# Patient Record
Sex: Female | Born: 1985 | Race: Black or African American | Hispanic: No | Marital: Single | Smoking: Current every day smoker
Health system: Southern US, Community
[De-identification: ages and names within clinical notes are randomized; demographics above are authoritative.]

## PROBLEM LIST (undated history)

## (undated) ENCOUNTER — Emergency Department (HOSPITAL_COMMUNITY): Admission: EM | Payer: Medicaid Other | Source: Home / Self Care

## (undated) DIAGNOSIS — N871 Moderate cervical dysplasia: Secondary | ICD-10-CM

## (undated) DIAGNOSIS — A630 Anogenital (venereal) warts: Secondary | ICD-10-CM

## (undated) DIAGNOSIS — IMO0002 Reserved for concepts with insufficient information to code with codable children: Secondary | ICD-10-CM

## (undated) DIAGNOSIS — R87619 Unspecified abnormal cytological findings in specimens from cervix uteri: Secondary | ICD-10-CM

## (undated) DIAGNOSIS — N870 Mild cervical dysplasia: Secondary | ICD-10-CM

## (undated) HISTORY — PX: COLPOSCOPY: SHX161

---
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
## 2003-01-11 ENCOUNTER — Emergency Department (HOSPITAL_COMMUNITY): Admission: EM | Admit: 2003-01-11 | Discharge: 2003-01-11 | Payer: Self-pay | Admitting: Emergency Medicine

## 2003-04-02 ENCOUNTER — Ambulatory Visit (HOSPITAL_COMMUNITY): Admission: RE | Admit: 2003-04-02 | Discharge: 2003-04-02 | Payer: Self-pay | Admitting: *Deleted

## 2003-04-20 ENCOUNTER — Inpatient Hospital Stay (HOSPITAL_COMMUNITY): Admission: AD | Admit: 2003-04-20 | Discharge: 2003-04-20 | Payer: Self-pay | Admitting: Obstetrics & Gynecology

## 2003-04-25 ENCOUNTER — Inpatient Hospital Stay (HOSPITAL_COMMUNITY): Admission: AD | Admit: 2003-04-25 | Discharge: 2003-04-26 | Payer: Self-pay | Admitting: Obstetrics and Gynecology

## 2003-06-11 ENCOUNTER — Encounter: Admission: RE | Admit: 2003-06-11 | Discharge: 2003-06-11 | Payer: Self-pay | Admitting: *Deleted

## 2003-06-14 ENCOUNTER — Inpatient Hospital Stay (HOSPITAL_COMMUNITY): Admission: AD | Admit: 2003-06-14 | Discharge: 2003-06-14 | Payer: Self-pay | Admitting: Family Medicine

## 2003-06-15 ENCOUNTER — Inpatient Hospital Stay (HOSPITAL_COMMUNITY): Admission: AD | Admit: 2003-06-15 | Discharge: 2003-06-18 | Payer: Self-pay | Admitting: Obstetrics

## 2003-06-16 ENCOUNTER — Encounter (INDEPENDENT_AMBULATORY_CARE_PROVIDER_SITE_OTHER): Payer: Self-pay | Admitting: Specialist

## 2003-08-30 ENCOUNTER — Emergency Department (HOSPITAL_COMMUNITY): Admission: EM | Admit: 2003-08-30 | Discharge: 2003-08-30 | Payer: Self-pay | Admitting: Emergency Medicine

## 2003-08-30 ENCOUNTER — Emergency Department (HOSPITAL_COMMUNITY): Admission: EM | Admit: 2003-08-30 | Discharge: 2003-08-30 | Payer: Self-pay

## 2003-10-16 ENCOUNTER — Inpatient Hospital Stay (HOSPITAL_COMMUNITY): Admission: AD | Admit: 2003-10-16 | Discharge: 2003-10-16 | Payer: Self-pay | Admitting: *Deleted

## 2003-10-16 ENCOUNTER — Emergency Department (HOSPITAL_COMMUNITY): Admission: EM | Admit: 2003-10-16 | Discharge: 2003-10-16 | Payer: Self-pay | Admitting: Emergency Medicine

## 2005-02-17 ENCOUNTER — Emergency Department (HOSPITAL_COMMUNITY): Admission: EM | Admit: 2005-02-17 | Discharge: 2005-02-17 | Payer: Self-pay | Admitting: Emergency Medicine

## 2005-02-26 ENCOUNTER — Emergency Department (HOSPITAL_COMMUNITY): Admission: EM | Admit: 2005-02-26 | Discharge: 2005-02-26 | Payer: Self-pay | Admitting: Family Medicine

## 2005-07-13 ENCOUNTER — Inpatient Hospital Stay (HOSPITAL_COMMUNITY): Admission: AD | Admit: 2005-07-13 | Discharge: 2005-07-13 | Payer: Self-pay | Admitting: *Deleted

## 2005-10-07 ENCOUNTER — Emergency Department (HOSPITAL_COMMUNITY): Admission: EM | Admit: 2005-10-07 | Discharge: 2005-10-07 | Payer: Self-pay | Admitting: Family Medicine

## 2006-05-31 ENCOUNTER — Emergency Department (HOSPITAL_COMMUNITY): Admission: EM | Admit: 2006-05-31 | Discharge: 2006-05-31 | Payer: Self-pay | Admitting: Family Medicine

## 2006-07-31 ENCOUNTER — Emergency Department (HOSPITAL_COMMUNITY): Admission: EM | Admit: 2006-07-31 | Discharge: 2006-07-31 | Payer: Self-pay | Admitting: Emergency Medicine

## 2006-08-06 ENCOUNTER — Emergency Department (HOSPITAL_COMMUNITY): Admission: EM | Admit: 2006-08-06 | Discharge: 2006-08-06 | Payer: Self-pay | Admitting: Family Medicine

## 2006-10-19 ENCOUNTER — Emergency Department (HOSPITAL_COMMUNITY): Admission: EM | Admit: 2006-10-19 | Discharge: 2006-10-19 | Payer: Self-pay | Admitting: Family Medicine

## 2007-05-13 ENCOUNTER — Emergency Department (HOSPITAL_COMMUNITY): Admission: EM | Admit: 2007-05-13 | Discharge: 2007-05-13 | Payer: Self-pay | Admitting: Emergency Medicine

## 2007-09-26 ENCOUNTER — Emergency Department (HOSPITAL_COMMUNITY): Admission: EM | Admit: 2007-09-26 | Discharge: 2007-09-26 | Payer: Self-pay | Admitting: Emergency Medicine

## 2008-01-30 ENCOUNTER — Inpatient Hospital Stay (HOSPITAL_COMMUNITY): Admission: AD | Admit: 2008-01-30 | Discharge: 2008-01-30 | Payer: Self-pay | Admitting: Obstetrics & Gynecology

## 2008-04-14 ENCOUNTER — Emergency Department (HOSPITAL_COMMUNITY): Admission: EM | Admit: 2008-04-14 | Discharge: 2008-04-14 | Payer: Self-pay | Admitting: Family Medicine

## 2008-07-30 ENCOUNTER — Emergency Department (HOSPITAL_COMMUNITY): Admission: EM | Admit: 2008-07-30 | Discharge: 2008-07-30 | Payer: Self-pay | Admitting: Family Medicine

## 2008-08-13 ENCOUNTER — Inpatient Hospital Stay (HOSPITAL_COMMUNITY): Admission: AD | Admit: 2008-08-13 | Discharge: 2008-08-13 | Payer: Self-pay | Admitting: Obstetrics & Gynecology

## 2008-10-15 ENCOUNTER — Emergency Department (HOSPITAL_COMMUNITY): Admission: EM | Admit: 2008-10-15 | Discharge: 2008-10-15 | Payer: Self-pay | Admitting: Emergency Medicine

## 2009-03-04 ENCOUNTER — Emergency Department (HOSPITAL_COMMUNITY): Admission: EM | Admit: 2009-03-04 | Discharge: 2009-03-04 | Payer: Self-pay | Admitting: Family Medicine

## 2009-04-02 ENCOUNTER — Emergency Department (HOSPITAL_COMMUNITY): Admission: EM | Admit: 2009-04-02 | Discharge: 2009-04-02 | Payer: Self-pay | Admitting: Family Medicine

## 2010-07-23 LAB — POCT URINALYSIS DIP (DEVICE)
Bilirubin Urine: NEGATIVE
Glucose, UA: NEGATIVE mg/dL
Ketones, ur: NEGATIVE mg/dL
Nitrite: NEGATIVE
Protein, ur: NEGATIVE mg/dL
Specific Gravity, Urine: 1.02 (ref 1.005–1.030)
Urobilinogen, UA: 0.2 mg/dL (ref 0.0–1.0)
pH: 5.5 (ref 5.0–8.0)

## 2010-07-23 LAB — WET PREP, GENITAL
Clue Cells Wet Prep HPF POC: NONE SEEN
Trich, Wet Prep: NONE SEEN
Yeast Wet Prep HPF POC: NONE SEEN

## 2010-07-23 LAB — GC/CHLAMYDIA PROBE AMP, GENITAL
Chlamydia, DNA Probe: NEGATIVE
GC Probe Amp, Genital: NEGATIVE

## 2010-07-23 LAB — POCT PREGNANCY, URINE: Preg Test, Ur: NEGATIVE

## 2010-07-28 ENCOUNTER — Ambulatory Visit: Payer: Self-pay | Admitting: *Deleted

## 2010-07-28 LAB — POCT PREGNANCY, URINE: Preg Test, Ur: NEGATIVE

## 2010-07-28 LAB — POCT URINALYSIS DIP (DEVICE)
Bilirubin Urine: NEGATIVE
Glucose, UA: NEGATIVE mg/dL
Hgb urine dipstick: NEGATIVE
Ketones, ur: NEGATIVE mg/dL
Nitrite: NEGATIVE
Protein, ur: NEGATIVE mg/dL
Specific Gravity, Urine: 1.02 (ref 1.005–1.030)
Urobilinogen, UA: 0.2 mg/dL (ref 0.0–1.0)
pH: 5.5 (ref 5.0–8.0)

## 2010-07-30 LAB — WET PREP, GENITAL
Trich, Wet Prep: NONE SEEN
Yeast Wet Prep HPF POC: NONE SEEN

## 2010-07-30 LAB — CBC
HCT: 37.2 % (ref 36.0–46.0)
Hemoglobin: 12.5 g/dL (ref 12.0–15.0)
MCHC: 33.6 g/dL (ref 30.0–36.0)
MCV: 81.4 fL (ref 78.0–100.0)
Platelets: 224 10*3/uL (ref 150–400)
RBC: 4.56 MIL/uL (ref 3.87–5.11)
RDW: 16.5 % — ABNORMAL HIGH (ref 11.5–15.5)
WBC: 10.3 10*3/uL (ref 4.0–10.5)

## 2010-07-30 LAB — URINALYSIS, ROUTINE W REFLEX MICROSCOPIC
Bilirubin Urine: NEGATIVE
Glucose, UA: NEGATIVE mg/dL
Ketones, ur: NEGATIVE mg/dL
Nitrite: NEGATIVE
Protein, ur: NEGATIVE mg/dL
Specific Gravity, Urine: >1.03 — ABNORMAL HIGH (ref 1.005–1.030)
Urobilinogen, UA: 0.2 mg/dL (ref 0.0–1.0)
pH: 5.5 (ref 5.0–8.0)

## 2010-07-30 LAB — URINE MICROSCOPIC-ADD ON

## 2010-07-30 LAB — GC/CHLAMYDIA PROBE AMP, GENITAL
Chlamydia, DNA Probe: NEGATIVE
GC Probe Amp, Genital: NEGATIVE

## 2010-08-06 ENCOUNTER — Ambulatory Visit: Payer: Self-pay | Admitting: *Deleted

## 2010-08-18 ENCOUNTER — Encounter: Payer: Medicaid Other | Attending: Family Medicine | Admitting: *Deleted

## 2010-08-18 DIAGNOSIS — Z713 Dietary counseling and surveillance: Secondary | ICD-10-CM | POA: Insufficient documentation

## 2010-08-18 DIAGNOSIS — E669 Obesity, unspecified: Secondary | ICD-10-CM | POA: Insufficient documentation

## 2010-11-14 ENCOUNTER — Emergency Department (HOSPITAL_COMMUNITY): Payer: Medicaid Other

## 2010-11-14 ENCOUNTER — Emergency Department (HOSPITAL_COMMUNITY)
Admission: EM | Admit: 2010-11-14 | Discharge: 2010-11-14 | Disposition: A | Discharge status: Home / Self Care | Payer: Medicaid Other | Attending: Emergency Medicine | Admitting: Emergency Medicine

## 2010-11-14 DIAGNOSIS — S0100XA Unspecified open wound of scalp, initial encounter: Secondary | ICD-10-CM | POA: Insufficient documentation

## 2010-11-14 DIAGNOSIS — F1010 Alcohol abuse, uncomplicated: Secondary | ICD-10-CM | POA: Insufficient documentation

## 2010-11-17 ENCOUNTER — Ambulatory Visit: Payer: Medicaid Other | Admitting: *Deleted

## 2011-01-08 LAB — POCT URINALYSIS DIP (DEVICE)
Glucose, UA: NEGATIVE
Hgb urine dipstick: NEGATIVE
Ketones, ur: NEGATIVE
Nitrite: NEGATIVE
Operator id: 239701
Protein, ur: 30 — AB
Specific Gravity, Urine: 1.025
Urobilinogen, UA: 0.2
pH: 5.5

## 2011-01-08 LAB — WET PREP, GENITAL
Clue Cells Wet Prep HPF POC: NONE SEEN
Trich, Wet Prep: NONE SEEN
Yeast Wet Prep HPF POC: NONE SEEN

## 2011-01-08 LAB — POCT PREGNANCY, URINE
Operator id: 239701
Preg Test, Ur: NEGATIVE

## 2011-01-08 LAB — GC/CHLAMYDIA PROBE AMP, GENITAL
Chlamydia, DNA Probe: POSITIVE — AB
GC Probe Amp, Genital: NEGATIVE

## 2011-01-12 ENCOUNTER — Inpatient Hospital Stay (INDEPENDENT_AMBULATORY_CARE_PROVIDER_SITE_OTHER)
Admission: RE | Admit: 2011-01-12 | Discharge: 2011-01-12 | Disposition: A | Discharge status: Home / Self Care | Payer: Medicaid Other | Source: Ambulatory Visit | Attending: Family Medicine | Admitting: Family Medicine

## 2011-01-12 DIAGNOSIS — Z4802 Encounter for removal of sutures: Secondary | ICD-10-CM

## 2011-01-12 DIAGNOSIS — W57XXXA Bitten or stung by nonvenomous insect and other nonvenomous arthropods, initial encounter: Secondary | ICD-10-CM

## 2011-01-15 LAB — WET PREP, GENITAL
Trich, Wet Prep: NONE SEEN
Yeast Wet Prep HPF POC: NONE SEEN

## 2011-01-15 LAB — POCT PREGNANCY, URINE
Operator id: 247071
Preg Test, Ur: NEGATIVE

## 2011-01-15 LAB — GC/CHLAMYDIA PROBE AMP, GENITAL
Chlamydia, DNA Probe: NEGATIVE
GC Probe Amp, Genital: POSITIVE — AB

## 2011-01-19 LAB — URINALYSIS, ROUTINE W REFLEX MICROSCOPIC
Bilirubin Urine: NEGATIVE
Nitrite: NEGATIVE
Specific Gravity, Urine: 1.02
Urobilinogen, UA: 0.2
pH: 6

## 2011-01-19 LAB — CBC
HCT: 37.3
Hemoglobin: 12.2
MCHC: 32.7
MCV: 85.3
Platelets: 253
RBC: 4.37
RDW: 14.3
WBC: 12.2 — ABNORMAL HIGH

## 2011-01-19 LAB — URINE MICROSCOPIC-ADD ON

## 2011-01-19 LAB — WET PREP, GENITAL: Clue Cells Wet Prep HPF POC: NONE SEEN

## 2011-01-19 LAB — GC/CHLAMYDIA PROBE AMP, GENITAL
Chlamydia, DNA Probe: POSITIVE — AB
GC Probe Amp, Genital: NEGATIVE

## 2011-01-22 LAB — POCT URINALYSIS DIP (DEVICE)
Nitrite: NEGATIVE
Urobilinogen, UA: 1 mg/dL (ref 0.0–1.0)
pH: 5.5 (ref 5.0–8.0)

## 2011-01-22 LAB — WET PREP, GENITAL

## 2011-02-03 LAB — POCT URINALYSIS DIP (DEVICE)
Hgb urine dipstick: NEGATIVE
Nitrite: NEGATIVE
Protein, ur: 30 — AB
Specific Gravity, Urine: >=1.03
Urobilinogen, UA: 0.2
pH: 6

## 2011-02-03 LAB — WET PREP, GENITAL
Trich, Wet Prep: NONE SEEN
Yeast Wet Prep HPF POC: NONE SEEN

## 2011-02-03 LAB — GC/CHLAMYDIA PROBE AMP, GENITAL
Chlamydia, DNA Probe: NEGATIVE
GC Probe Amp, Genital: NEGATIVE

## 2011-06-10 ENCOUNTER — Encounter (HOSPITAL_COMMUNITY): Payer: Self-pay | Admitting: Emergency Medicine

## 2011-06-10 ENCOUNTER — Emergency Department (HOSPITAL_COMMUNITY)
Admission: EM | Admit: 2011-06-10 | Discharge: 2011-06-10 | Disposition: A | Discharge status: Home Health | Payer: Medicaid Other | Source: Home / Self Care

## 2011-06-10 MED ORDER — TETRACAINE HCL 0.5 % OP SOLN
OPHTHALMIC | Status: AC
  Filled 2011-06-10: qty 2

## 2011-06-10 MED ORDER — TETRACAINE HCL 0.5 % OP SOLN: 1.0000 [drp] | Freq: Once | OPHTHALMIC | Status: DC

## 2011-06-10 NOTE — Discharge Instructions (Signed)
Blepharitis Blepharitis is redness, soreness, and swelling (inflammation) of one or both eyelids. It may be caused by an allergic reaction or a bacterial infection. Blepharitis may also be associated with reddened, scaly skin (seborrhea) of the scalp and eyebrows. While you sleep, eye discharge may cause your eyelashes to stick together. Your eyelids may itch, burn, swell, and may lose their lashes. These will grow back. Your eyes may become sensitive. Blepharitis may recur and need repeated treatment. If this is the case, you may require further evaluation by an eye specialist (ophthalmologist). HOME CARE INSTRUCTIONS   Keep your hands clean.   Use a clean towel each time you dry your eyelids. Do not use this towel to clean other areas. Do not share a towel or makeup with anyone.   Wash your eyelids with warm water or warm water mixed with a small amount of baby shampoo. Do this twice a day or as often as needed.   Wash your face and eyebrows at least once a day.   Use warm compresses 2 times a day for 10 minutes at a time, or as directed by your caregiver.   Apply antibiotic ointment as directed by your caregiver.   Avoid rubbing your eyes.   Avoid wearing makeup until you get better.   Follow up with your caregiver as directed.  SEEK IMMEDIATE MEDICAL CARE IF:   You have pain, redness, or swelling that gets worse or spreads to other parts of your face.   Your vision changes, or you have pain when looking at lights or moving objects.   You have a fever.   Your symptoms continue for longer than 2 to 4 days or become worse.  MAKE SURE YOU:   Understand these instructions.   Will watch your condition.   Will get help right away if you are not doing well or get worse.  Document Released: 04/03/2000 Document Revised: 12/17/2010 Document Reviewed: 05/14/2010 [employer] Patient Information 2012 [employer]. 

## 2011-06-10 NOTE — ED Provider Notes (Signed)
Julie Hendrix is a 26 y.o. female who presents to Urgent Care today for eye drainage. She states that for the past week she has had URI symptoms consistent with nasal congestion, nasal drainage, sneezing, mild cough. She states yesterday morning she woke up with her eyes were matted shut. She wipes them clean with a washcloth and has been using Visine since then. She also noticed some blurry vision in both of her eyes. 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 Her right eye she describes is 20/20 vision. Describes itchy eyes as well as some ocular clear drainage.  No eye pain.  No sensation of foreign body.    PMH reviewed.  ROS as above otherwise neg Medications reviewed. Current Facility-Administered Medications  Medication Dose Route Frequency Provider Last Rate Last Dose  . DISCONTD: tetracaine (PONTOCAINE) 0.5 % ophthalmic solution 1-2 drop  1-2 drop Both Eyes Once Luiz Blare, MD       No current outpatient prescriptions on file.    Exam:  BP 118/73  Pulse 72  Temp(Src) 98.1 F (36.7 C) (Oral)  Resp 18  SpO2 100%  LMP 06/09/2011 Gen: Well NAD HEENT: EOMI,  MMM Eyes:  BL eyes with some crusting around eyelids.  BL eyes with sclera and conjunctiva clear.  PERRL bilaterally. EOMI BL with no pain on extra-ocular movement.  Ophthalmologic exam revealed normal fundoscopy with good cup to disc margins BL.  Lateral examination with ophthalmoscope revealed no foreign body in eye. Scar noted over Left eye.   Lungs: CTABL Nl WOB Heart: RRR no MRG Abd: NABS, NT, ND  Right eye 20/15 and Left eye 20/40, which she states is her baseline for that eye.  Assessment and Plan: 1. Blepharitis.  Mostly likely diagnosis in patient with recent URI and now with ocular drainage and crusting.  Provided handout on this condition.  She is to use warm compresses over eye for relief.  Limit Visine use.  If persists, follow up here or with PCP.  No  red flags by exam or history.  Visual acuity testing per baseline.     Renold Don, MD 06/10/11 [phone]

## 2011-06-10 NOTE — ED Notes (Signed)
Pt here with bilat eye drainage,redness and itching that started yesterday.min blurry vision.denies dizziness or pain.pt tried warm cloth and otc eye drops but irritation still present

## 2011-06-14 NOTE — ED Provider Notes (Signed)
Medical screening examination/treatment/procedure(s) were performed by the resident and as supervising physician I was immediately available for consultation/collaboration.  Luiz Blare MD  Luiz Blare, MD 06/14/11 [phone]

## 2011-10-16 ENCOUNTER — Encounter (HOSPITAL_COMMUNITY): Payer: Self-pay

## 2011-10-16 ENCOUNTER — Emergency Department (HOSPITAL_COMMUNITY)
Admission: EM | Admit: 2011-10-16 | Discharge: 2011-10-17 | Disposition: A | Discharge status: Home / Self Care | Payer: Medicaid Other | Attending: Emergency Medicine | Admitting: Emergency Medicine

## 2011-10-16 DIAGNOSIS — R200 Anesthesia of skin: Secondary | ICD-10-CM

## 2011-10-16 DIAGNOSIS — F17200 Nicotine dependence, unspecified, uncomplicated: Secondary | ICD-10-CM | POA: Insufficient documentation

## 2011-10-16 DIAGNOSIS — R209 Unspecified disturbances of skin sensation: Secondary | ICD-10-CM | POA: Insufficient documentation

## 2011-10-16 LAB — URINALYSIS, ROUTINE W REFLEX MICROSCOPIC
Bilirubin Urine: NEGATIVE
Glucose, UA: NEGATIVE mg/dL
Hgb urine dipstick: NEGATIVE
Ketones, ur: NEGATIVE mg/dL
Protein, ur: NEGATIVE mg/dL

## 2011-10-16 NOTE — ED Notes (Signed)
C/o numbness to bilat legs and hands, headache which makes her feel nauseated.  C/o these things x 2 days.  Denies any other issues.

## 2011-10-17 LAB — GLUCOSE, CAPILLARY: Glucose-Capillary: 92 mg/dL (ref 70–99)

## 2011-10-17 NOTE — Discharge Instructions (Signed)
Paresthesia   Paresthesia is an abnormal burning or prickling sensation. This sensation is generally felt in the hands, arms, legs, or feet. However, it may occur in any part of the body. It is usually not painful. The feeling may be described as:   Tingling or numbness.   "Pins and needles."   Skin crawling.   Buzzing.   Limbs "falling asleep."   Itching.  Most people experience temporary (transient) paresthesia at some time in their lives.   CAUSES   Paresthesia may occur when you breathe too quickly (hyperventilation). It can also occur without any apparent cause. Commonly, paresthesia occurs when pressure is placed on a nerve. The feeling quickly goes away once the pressure is removed. For some people, however, paresthesia is a long-lasting (chronic) condition caused by an underlying disorder. The underlying disorder may be:   A traumatic, direct injury to nerves. Examples include a:   Broken (fractured) neck.   Fractured skull.   A disorder affecting the brain and spinal cord (central nervous system). Examples include:   Transverse myelitis.   Encephalitis.   Transient ischemic attack.   Multiple sclerosis.   Stroke.   Tumor or blood vessel problems, such as an arteriovenous malformation pressing against the brain or spinal cord.   A condition that damages the peripheral nerves (peripheral neuropathy). Peripheral nerves are not part of the brain and spinal cord. These conditions include:   Diabetes.   Peripheral vascular disease.   Nerve entrapment syndromes, such as carpal tunnel syndrome.   Shingles.   Hypothyroidism.   Vitamin B12 deficiencies.   Alcoholism.   Heavy metal poisoning (lead, arsenic).   Rheumatoid arthritis.   Systemic lupus erythematosus.  DIAGNOSIS   Your caregiver will attempt to find the underlying cause of your paresthesia. Your caregiver may:   Take your medical history.   Perform a physical exam.   Order various lab tests.   Order imaging tests.  TREATMENT   Treatment for paresthesia  depends on the underlying cause.   HOME CARE INSTRUCTIONS   Avoid drinking alcohol.   You may consider massage or acupuncture to help relieve your symptoms.   Keep all follow-up appointments as directed by your caregiver.  SEEK IMMEDIATE MEDICAL CARE IF:   You feel weak.   You have trouble walking or moving.   You have problems with speech or vision.   You feel confused.   You cannot control your bladder or bowel movements.   You feel numbness after an injury.   You faint.   Your burning or prickling feeling gets worse when walking.   You have pain, cramps, or dizziness.   You develop a rash.  MAKE SURE YOU:   Understand these instructions.   Will watch your condition.   Will get help right away if you are not doing well or get worse.

## 2011-10-17 NOTE — ED Provider Notes (Addendum)
History     CSN: [id number]  Arrival date & time 10/16/11  2042   First MD Initiated Contact with Patient 10/16/11 2305     SCC: numbness   (Consider location/radiation/quality/duration/timing/severity/associated sxs/prior treatment)   Hx per PT, numbness in hands and legs x 3 weeks, on and off, usually in the morning when she wakes up and after sitting down.  No weakness, no HA or unilateral symptoms, no medical problems or h/o same. Is a a smoker. Just started a new job a month ago and is on her feet a lot.  No h/o DM. Asymptomatic now. No trouble urinating. LMP 10/03/11 nl and on time, followed by [facility]. No neck or back pain or trauma.  History reviewed. No pertinent past medical history.  History reviewed. No pertinent past surgical history.  History reviewed. No pertinent family history.  History  Substance Use Topics  . Smoking status: Current Everyday Smoker -- 0.5 packs/day  . Smokeless tobacco: Not on file  . Alcohol Use: Yes     occasionally    OB History    Grav Para Term Preterm Abortions TAB SAB Ect Mult Living                  Review of Systems  Constitutional: Negative for fever and chills.  HENT: Negative for neck pain and neck stiffness.   Eyes: Negative for pain.  Genitourinary: Negative for dysuria.  Musculoskeletal: Negative for back pain.  Skin: Negative for rash.  Neurological: Positive for numbness. Negative for dizziness, tremors, seizures, syncope, facial asymmetry, speech difficulty and weakness.  All other systems reviewed and are negative.    Allergies  Review of patient's allergies indicates no known allergies.  Home Medications  No current outpatient prescriptions on file.  BP 120/81  Pulse 91  Temp 98.6 F (37 C) (Oral)  Resp 20  Wt 222 lb (100.699 kg)  SpO2 100%  LMP 09/02/2011  Physical Exam  Constitutional: She is oriented to person, place, and time. She appears well-developed and well-nourished.  HENT:  Head:  Normocephalic and atraumatic.  Eyes: Conjunctivae and EOM are normal. Pupils are equal, round, and reactive to light.  Neck: Full passive range of motion without pain. Neck supple. No thyromegaly present.       No meningismus  Cardiovascular: Normal rate, regular rhythm, S1 normal, S2 normal and intact distal pulses.   Pulmonary/Chest: Effort normal and breath sounds normal.  Abdominal: Soft. Bowel sounds are normal. There is no tenderness. There is no CVA tenderness.  Musculoskeletal: Normal range of motion.  Neurological: She is alert and oriented to person, place, and time. She has normal strength and normal reflexes. No cranial nerve deficit or sensory deficit. She displays a negative Romberg sign. GCS eye subscore is 4. GCS verbal subscore is 5. GCS motor subscore is 6.       Normal Gait  Skin: Skin is warm and dry. No rash noted. No cyanosis. Nails show no clubbing.  Psychiatric: She has a normal mood and affect. Her speech is normal and behavior is normal.    ED Course  Procedures (including critical care time)  Results for orders placed during the hospital encounter of 10/16/11  URINALYSIS, ROUTINE W REFLEX MICROSCOPIC      Component Value Range   Color, Urine YELLOW  YELLOW   APPearance CLEAR  CLEAR   Specific Gravity, Urine 1.023  1.005 - 1.030   pH 5.5  5.0 - 8.0   Glucose, UA NEGATIVE  NEGATIVE mg/dL   Hgb urine dipstick NEGATIVE  NEGATIVE   Bilirubin Urine NEGATIVE  NEGATIVE   Ketones, ur NEGATIVE  NEGATIVE mg/dL   Protein, ur NEGATIVE  NEGATIVE mg/dL   Urobilinogen, UA 0.2  0.0 - 1.0 mg/dL   Nitrite NEGATIVE  NEGATIVE   Leukocytes, UA NEGATIVE  NEGATIVE  GLUCOSE, CAPILLARY      Component Value Range   Glucose-Capillary 92  70 - 99 mg/dL   Patient also expresses concern over her blood pressure which is normal range. Reassurance provided.   MDM   Intermittent hand and feet numbness. Currently asymptomatic. Normal neuro exam. Normal blood sugar and urinalysis as  above. As primary care followup and is stable for discharge home without indication for further workup at this time. Symptoms likely related to new job and working many hours on her feet which she has not used to.  Nursing notes reviewed. PT denies any weakness.       Sunnie Nielsen, MD 10/17/11 0055   Date: 11/10/2011  Rate: 70  Rhythm: normal sinus rhythm  QRS Axis: right  Intervals: normal  ST/T Wave abnormalities: nonspecific ST changes  Conduction Disutrbances:none  Narrative Interpretation: suspect arm lead reversal  Old EKG Reviewed: none available    Sunnie Nielsen, MD 11/10/11 [phone]

## 2011-11-01 ENCOUNTER — Emergency Department (HOSPITAL_COMMUNITY)
Admission: EM | Admit: 2011-11-01 | Discharge: 2011-11-01 | Disposition: A | Discharge status: Home / Self Care | Payer: Self-pay | Attending: Emergency Medicine | Admitting: Emergency Medicine

## 2011-11-01 ENCOUNTER — Emergency Department (HOSPITAL_COMMUNITY): Payer: Self-pay

## 2011-11-01 ENCOUNTER — Encounter (HOSPITAL_COMMUNITY): Payer: Self-pay | Admitting: *Deleted

## 2011-11-01 DIAGNOSIS — M25569 Pain in unspecified knee: Secondary | ICD-10-CM | POA: Insufficient documentation

## 2011-11-01 DIAGNOSIS — F17200 Nicotine dependence, unspecified, uncomplicated: Secondary | ICD-10-CM | POA: Insufficient documentation

## 2011-11-01 DIAGNOSIS — M79609 Pain in unspecified limb: Secondary | ICD-10-CM | POA: Insufficient documentation

## 2011-11-01 DIAGNOSIS — IMO0002 Reserved for concepts with insufficient information to code with codable children: Secondary | ICD-10-CM | POA: Insufficient documentation

## 2011-11-01 DIAGNOSIS — X500XXA Overexertion from strenuous movement or load, initial encounter: Secondary | ICD-10-CM | POA: Insufficient documentation

## 2011-11-01 DIAGNOSIS — M25579 Pain in unspecified ankle and joints of unspecified foot: Secondary | ICD-10-CM | POA: Insufficient documentation

## 2011-11-01 DIAGNOSIS — M25473 Effusion, unspecified ankle: Secondary | ICD-10-CM | POA: Insufficient documentation

## 2011-11-01 DIAGNOSIS — M25476 Effusion, unspecified foot: Secondary | ICD-10-CM | POA: Insufficient documentation

## 2011-11-01 DIAGNOSIS — S8390XA Sprain of unspecified site of unspecified knee, initial encounter: Secondary | ICD-10-CM

## 2011-11-01 MED ORDER — ACETAMINOPHEN-CODEINE #3 300-30 MG PO TABS: 1.0000 | ORAL_TABLET | Freq: Four times a day (QID) | ORAL | Status: AC | PRN

## 2011-11-01 MED ORDER — ACETAMINOPHEN-CODEINE #3 300-30 MG PO TABS
2.0000 | ORAL_TABLET | Freq: Once | ORAL | Status: AC
  Administered 2011-11-01: 2 via ORAL
  Filled 2011-11-01: qty 2

## 2011-11-01 MED ORDER — IBUPROFEN 800 MG PO TABS: 800.0000 mg | ORAL_TABLET | Freq: Three times a day (TID) | ORAL | Status: AC

## 2011-11-01 NOTE — ED Provider Notes (Signed)
History     CSN: [id number]  Arrival date & time 11/01/11  2115   First MD Initiated Contact with Patient 11/01/11 2133      Chief Complaint  Patient presents with  . Leg Pain    right    (Consider location/radiation/quality/duration/timing/severity/associated sxs/prior treatment) HPI  Patient presents to emergency department complaining of right knee injury earlier this morning stating that she was climbing back into bed to go to sleep and had her knee bent resting on the bed and twisted to look behind her. Patient states when she twisted she felt a pop in her right knee. Patient states that since then she's had pain and swelling has been unable to bear weight due to pain or fully bend her leg. Patient denies additional injury. Patient states pain is coming from her knee. She denies any lower extremity numbness/tingling/weakness. She denies break in skin. Patient took Tylenol earlier today for the pain without relief of symptoms. Symptoms were acute onset, persistent and unchanging. Patient was given a knee immobilizer by her mother to stabilize knee with some improvement in pain.   History reviewed. No pertinent past medical history.  History reviewed. No pertinent past surgical history.  History reviewed. No pertinent family history.  History  Substance Use Topics  . Smoking status: Current Everyday Smoker -- 0.5 packs/day  . Smokeless tobacco: Not on file  . Alcohol Use: Yes     occasionally    OB History    Grav Para Term Preterm Abortions TAB SAB Ect Mult Living                  Review of Systems  Musculoskeletal: Positive for joint swelling and gait problem. Negative for back pain.  Skin: Negative for color change and wound.  Neurological: Negative for weakness and numbness.    Allergies  Review of patient's allergies indicates no known allergies.  Home Medications  No current outpatient prescriptions on file.  BP 107/70  Pulse 114  Temp 100.1 F (37.8 C)  (Oral)  Resp 26  Ht 5\' 3"  (1.6 m)  Wt 220 lb (99.791 kg)  BMI 38.97 kg/m2  SpO2 98%  LMP 10/03/2011  Physical Exam  Constitutional: She is oriented to person, place, and time. She appears well-developed and well-nourished. No distress.  HENT:  Head: Normocephalic and atraumatic.  Eyes: Conjunctivae are normal.  Cardiovascular: Normal rate and regular rhythm.   Pulmonary/Chest: Effort normal.  Musculoskeletal: She exhibits tenderness. She exhibits no edema.       Right ankle: She exhibits swelling. tenderness.       TTP of entire right knee with decreased ROM due to pain but no erythema, edema or break in skin. Negative bollotment. No TTP or swelling of calf or thigh. No laxity in ant/post/med/lateral stress. Good pedal pulse and sensation of entire RLE   Neurological: She is alert and oriented to person, place, and time.       Normal sensation of entire foot.   Skin: Skin is warm and dry. No rash noted. She is not diaphoretic. No erythema. No pallor.  Psychiatric: She has a normal mood and affect. Her behavior is normal.    ED Course  Procedures (including critical care time)  PO tylenol #3  Ice pack   Labs Reviewed - No data to display Dg Knee Complete 4 Views Right  11/01/2011  *RADIOLOGY REPORT*  Clinical Data: Right knee pain after twisting injury.  RIGHT KNEE - COMPLETE 4+ VIEW  Comparison: None.  Findings: Visualization of the right knee is limited due to patient positioning.  No displaced fracture or subluxation is appreciated. No significant effusion.  Bone cortex and trabecular architecture as visualized appear intact.  No focal bone lesion or bone destruction appreciated.  IMPRESSION: Technically limited study due to patient positioning.  No acute bony abnormalities identified.  If symptoms persist, repeat views may be useful for better evaluation.  Original Report Authenticated By: Marlon Pel, M.D.     1. Knee sprain       MDM  Left lower extremity is  neurovascularly intact. No acute findings on the x-ray. Mechanism of action consistent with a sprain of her right knee. Patient already has a well fitting knee immobilizer. We'll give her crutches for comfort. She's been given an orthopedic followup for further evaluation in the near future for ongoing pain. No other injury indicated or noted on exam.        Drucie Opitz, PA 11/01/11 2318

## 2011-11-01 NOTE — ED Notes (Signed)
Pt sts she was trying to get into her bed when her leg popped. Not able to bear weight at this time. Patient has knee immobilizer from mother on her right leg at this time. Able to wiggle digits.

## 2011-11-01 NOTE — ED Provider Notes (Signed)
Medical screening examination/treatment/procedure(s) were performed by non-physician practitioner and as supervising physician I was immediately available for consultation/collaboration.  Gerhard Munch, MD 11/01/11 [phone]

## 2012-03-01 ENCOUNTER — Emergency Department (HOSPITAL_COMMUNITY)
Admission: EM | Admit: 2012-03-01 | Discharge: 2012-03-01 | Disposition: A | Discharge status: Home / Self Care | Payer: Self-pay | Attending: Emergency Medicine | Admitting: Emergency Medicine

## 2012-03-01 ENCOUNTER — Encounter (HOSPITAL_COMMUNITY): Payer: Self-pay | Admitting: *Deleted

## 2012-03-01 DIAGNOSIS — K0889 Other specified disorders of teeth and supporting structures: Secondary | ICD-10-CM

## 2012-03-01 DIAGNOSIS — F17200 Nicotine dependence, unspecified, uncomplicated: Secondary | ICD-10-CM | POA: Insufficient documentation

## 2012-03-01 DIAGNOSIS — K089 Disorder of teeth and supporting structures, unspecified: Secondary | ICD-10-CM | POA: Insufficient documentation

## 2012-03-01 DIAGNOSIS — K029 Dental caries, unspecified: Secondary | ICD-10-CM | POA: Insufficient documentation

## 2012-03-01 MED ORDER — HYDROCODONE-ACETAMINOPHEN 5-325 MG PO TABS: ORAL_TABLET | ORAL | Status: DC

## 2012-03-01 MED ORDER — NAPROXEN 250 MG PO TABS: 250.0000 mg | ORAL_TABLET | Freq: Two times a day (BID) | ORAL | Status: DC

## 2012-03-01 MED ORDER — IBUPROFEN 400 MG PO TABS
400.0000 mg | ORAL_TABLET | Freq: Once | ORAL | Status: AC
  Administered 2012-03-01: 400 mg via ORAL
  Filled 2012-03-01: qty 1

## 2012-03-01 MED ORDER — PENICILLIN V POTASSIUM 250 MG PO TABS: 250.0000 mg | ORAL_TABLET | Freq: Four times a day (QID) | ORAL | Status: DC

## 2012-03-01 MED ORDER — OXYCODONE-ACETAMINOPHEN 5-325 MG PO TABS
1.0000 | ORAL_TABLET | Freq: Once | ORAL | Status: AC
  Administered 2012-03-01: 1 via ORAL
  Filled 2012-03-01: qty 1

## 2012-03-01 NOTE — ED Provider Notes (Signed)
History  This chart was scribed for Julie Anger, DO by Bennett Scrape, ED Scribe. This patient was seen in room TR08C/TR08C and the patient's care was started at 12:12PM.  CSN: [id number]  Arrival date & time 03/01/12  1114   First MD Initiated Contact with Patient 03/01/12 1212      Chief Complaint  Patient presents with  . Dental Pain     The history is provided by the patient. No language interpreter was used.  Pt was seen at 1235.  Julie Hendrix is a 26 y.o. female who presents to the Emergency Department complaining of gradual onset and persistence of constant right upper and lower teeth "pain" for the past 3 days.  States the pain makes the entire right side of her face "hurt."  Denies fevers, no intra-oral edema, no rash, no facial swelling, no dysphagia, no neck pain.   The condition is aggravated by nothing. The condition is relieved by nothing. The symptoms have been associated with no other complaints. The patient has no significant history of serious medical conditions.     History reviewed. No pertinent past medical history.  History reviewed. No pertinent past surgical history.    History  Substance Use Topics  . Smoking status: Current Every Day Smoker -- 0.5 packs/day  . Smokeless tobacco: Not on file  . Alcohol Use: Yes     Comment: occasionally      Review of Systems ROS: Statement: All systems negative except as marked or noted in the HPI; Constitutional: Negative for fever and chills. ; ; Eyes: Negative for eye pain and discharge. ; ; ENMT: Positive for dental caries, dental hygiene poor and toothache. Negative for ear pain, bleeding gums, dental injury, facial deformity, facial swelling, hoarseness, nasal congestion, sinus pressure, sore throat, throat swelling and tongue swollen. ; ; Cardiovascular: Negative for chest pain, palpitations, diaphoresis, dyspnea and peripheral edema. ; ; Respiratory: Negative for cough, wheezing and stridor. ; ;  Gastrointestinal: Negative for nausea, vomiting, diarrhea and abdominal pain. ; ; Genitourinary: Negative for dysuria, flank pain and hematuria. ; ; Musculoskeletal: Negative for back pain and neck pain. ; ; Skin: Negative for rash and skin lesion. ; ; Neuro: Negative for headache, lightheadedness and neck stiffness. ;    Allergies  Review of patient's allergies indicates no known allergies.  Home Medications   Current Outpatient Rx  Name  Route  Sig  Dispense  Refill  . BC HEADACHE POWDER PO   Oral   Take 1 packet by mouth 3 (three) times daily as needed. For pain           Triage Vitals: BP 146/92  Pulse 69  Temp 97.9 F (36.6 C) (Oral)  Resp 18  SpO2 99%  Physical Exam 1240: Physical examination: Vital signs and O2 SAT: Reviewed; Constitutional: Well developed, Well nourished, Well hydrated, In no acute distress; Head and Face: Normocephalic, Atraumatic; Eyes: EOMI, PERRL, No scleral icterus; ENMT: Mouth and pharynx normal, Poor dentition, Widespread dental decay, Left TM normal, Right TM normal, Mucous membranes moist, +upper right 1st premolar and lower right 2nd premolar with dental decay.  No gingival erythema, edema, fluctuance, or drainage.  No intra-oral edema. No hoarse voice, no drooling, no stridor.  ; Neck: Supple, Full range of motion, No lymphadenopathy; Cardiovascular: Regular rate and rhythm, No murmur, rub, or gallop; Respiratory: Breath sounds clear & equal bilaterally, No rales, rhonchi, wheezes, Normal respiratory effort/excursion; Chest: Nontender, Movement normal; Extremities: Pulses normal, No tenderness, No edema;  Neuro: AA&Ox3, Major CN grossly intact.  No gross focal motor or sensory deficits in extremities.; Skin: Color normal, No rash, No petechiae, Warm, Dry   ED Course  Procedures   DIAGNOSTIC STUDIES: Oxygen Saturation is 99% on room air, normal by my interpretation.    COORDINATION OF CARE: 12:39 PM- Discussed discharge plan which includes pain  medication and a referral to a dentist and oral surgeon with pt and pt agreed to plan.  Pt encouraged to f/u with dentist or oral surgeon for her dental needs for good continuity of care and definitive treatment.  Verb understanding.     MDM  MDM Reviewed: previous chart, nursing note and vitals        I personally performed the services described in this documentation, which was scribed in my presence. The recorded information has been reviewed and is accurate.    Julie Anger, DO 03/03/12 1351

## 2012-03-01 NOTE — ED Notes (Signed)
PT is here with toothache to right upper and lower jaw for 3 days.  Reports headache and sorethroat

## 2012-06-30 ENCOUNTER — Inpatient Hospital Stay (HOSPITAL_COMMUNITY)
Admission: AD | Admit: 2012-06-30 | Discharge: 2012-06-30 | Disposition: A | Discharge status: Home / Self Care | Payer: Self-pay | Source: Ambulatory Visit | Attending: Obstetrics and Gynecology | Admitting: Obstetrics and Gynecology

## 2012-06-30 ENCOUNTER — Encounter (HOSPITAL_COMMUNITY): Payer: Self-pay | Admitting: *Deleted

## 2012-06-30 DIAGNOSIS — A499 Bacterial infection, unspecified: Secondary | ICD-10-CM | POA: Insufficient documentation

## 2012-06-30 DIAGNOSIS — B9689 Other specified bacterial agents as the cause of diseases classified elsewhere: Secondary | ICD-10-CM | POA: Insufficient documentation

## 2012-06-30 DIAGNOSIS — N760 Acute vaginitis: Secondary | ICD-10-CM | POA: Insufficient documentation

## 2012-06-30 DIAGNOSIS — K3189 Other diseases of stomach and duodenum: Secondary | ICD-10-CM | POA: Insufficient documentation

## 2012-06-30 DIAGNOSIS — R079 Chest pain, unspecified: Secondary | ICD-10-CM | POA: Insufficient documentation

## 2012-06-30 DIAGNOSIS — N926 Irregular menstruation, unspecified: Secondary | ICD-10-CM | POA: Insufficient documentation

## 2012-06-30 DIAGNOSIS — M549 Dorsalgia, unspecified: Secondary | ICD-10-CM | POA: Insufficient documentation

## 2012-06-30 DIAGNOSIS — K219 Gastro-esophageal reflux disease without esophagitis: Secondary | ICD-10-CM | POA: Insufficient documentation

## 2012-06-30 DIAGNOSIS — R1013 Epigastric pain: Secondary | ICD-10-CM | POA: Insufficient documentation

## 2012-06-30 LAB — URINALYSIS, ROUTINE W REFLEX MICROSCOPIC
Bilirubin Urine: NEGATIVE
Glucose, UA: NEGATIVE mg/dL
Ketones, ur: NEGATIVE mg/dL
Leukocytes, UA: NEGATIVE
pH: 5.5 (ref 5.0–8.0)

## 2012-06-30 LAB — URINE MICROSCOPIC-ADD ON

## 2012-06-30 LAB — WET PREP, GENITAL: Trich, Wet Prep: NONE SEEN

## 2012-06-30 MED ORDER — CYCLOBENZAPRINE HCL 10 MG PO TABS: 10.0000 mg | ORAL_TABLET | Freq: Two times a day (BID) | ORAL | Status: DC | PRN

## 2012-06-30 MED ORDER — CYCLOBENZAPRINE HCL 10 MG PO TABS
10.0000 mg | ORAL_TABLET | Freq: Once | ORAL | Status: AC
  Administered 2012-06-30: 10 mg via ORAL
  Filled 2012-06-30: qty 1

## 2012-06-30 MED ORDER — FAMOTIDINE 20 MG PO TABS
20.0000 mg | ORAL_TABLET | Freq: Once | ORAL | Status: AC
  Administered 2012-06-30: 20 mg via ORAL
  Filled 2012-06-30: qty 1

## 2012-06-30 MED ORDER — METRONIDAZOLE 500 MG PO TABS: 500.0000 mg | ORAL_TABLET | Freq: Two times a day (BID) | ORAL | Status: DC

## 2012-06-30 MED ORDER — LANSOPRAZOLE 15 MG PO CPDR: 15.0000 mg | DELAYED_RELEASE_CAPSULE | Freq: Every day | ORAL | Status: DC

## 2012-06-30 NOTE — MAU Provider Note (Signed)
History     CSN: [id number]  Arrival date and time: 06/30/12 0104   First Provider Initiated Contact with Patient 06/30/12 0135      Chief Complaint  Patient presents with  . Vaginal Bleeding  . Back Pain  . Chest Pain  . Vaginal Discharge   HPI [name] is a 27 y.o. [id number] who presents to MAU today with multiple unrelated complaints. The patient states that she has been having spotting x 4-5 days with mild lower abdominal cramping. She reports a brown vaginal discharge with an odor x 2 weeks.  Patient states LMP was 05/16/12. She is also complaining of upper and mid thoracic back pain in the perispinal region that started today. She is also complaining of mid to left sided chest pain. She states that the pain is sharp and constant and runs mostly at the sternum and over to the left. She rates the pain at 8/10 and has some numbness in her arms. She also has a history of carpal tunnel and feels that the pain seems to radiate more from the hands up the arms then down from the chest. She denies SOB, fever, N/V, dizziness, LOC. She states no significant history of cardiac or pulmonary concerns. No history of HTN. Patient does state a history of irregular menses. She also desires pregnancy. Her and her significant other have been trying to conceive x ~8 months without success.   OB History   Grav Para Term Preterm Abortions TAB SAB Ect Mult Living   3 2 2  0 1 0 0 0 0 2      Past Medical History  Diagnosis Date  . Medical history non-contributory     Past Surgical History  Procedure Laterality Date  . No past surgeries      Family History  Problem Relation Age of Onset  . Hypertension Father   . Diabetes Father     History  Substance Use Topics  . Smoking status: Former Smoker -- 0.50 packs/day    Quit date: 01/31/2012  . Smokeless tobacco: Not on file  . Alcohol Use: Yes     Comment: occasionally    Allergies: No Known Allergies  Prescriptions prior to  admission  Medication Sig Dispense Refill  . Aspirin-Salicylamide-Caffeine (BC HEADACHE POWDER PO) Take 1 packet by mouth 3 (three) times daily as needed. For pain      . HYDROcodone-acetaminophen (NORCO/VICODIN) 5-325 MG per tablet 1 or 2 tabs PO q6 hours prn pain  20 tablet  0  . naproxen (NAPROSYN) 250 MG tablet Take 1 tablet (250 mg total) by mouth 2 (two) times daily with a meal.  14 tablet  0  . penicillin v potassium (VEETID) 250 MG tablet Take 1 tablet (250 mg total) by mouth 4 (four) times daily.  20 tablet  0    Review of Systems  Constitutional: Negative for fever, chills and malaise/fatigue.  HENT: Negative for neck pain.   Eyes: Negative for blurred vision and double vision.  Respiratory: Negative for cough and wheezing.   Cardiovascular: Positive for chest pain. Negative for palpitations.  Gastrointestinal: Positive for heartburn and abdominal pain. Negative for nausea, vomiting, diarrhea and constipation.  Genitourinary: Negative for dysuria, urgency and frequency.       + Vaginal bleeding + vaginal discharge  Musculoskeletal: Positive for back pain. Negative for joint pain.  Neurological: Positive for headaches. Negative for dizziness, focal weakness and loss of consciousness.   Physical Exam   Blood pressure 139/81,  pulse 65, temperature 98.2 F (36.8 C), temperature source Oral, resp. rate 18, last menstrual period 05/16/2012.  Physical Exam  Constitutional: She is oriented to person, place, and time.  Well-developed obese female in no acute distress. She is sitting very comfortably in the bed  HENT:  Head: Normocephalic and atraumatic.  Neck: Normal range of motion.  Cardiovascular: Normal rate, regular rhythm and normal heart sounds.  Exam reveals no gallop and no friction rub.   No murmur heard. Respiratory: Effort normal and breath sounds normal. No respiratory distress. She has no wheezes. She has no rales. She exhibits tenderness (mild tenderness to palpation  of the left chest). She exhibits no mass, no edema, no deformity and no retraction.  GI: Soft. Bowel sounds are normal. She exhibits no distension and no mass. There is no tenderness. There is no rebound, no guarding and no CVA tenderness.  Genitourinary: Vagina normal. Uterus is not enlarged and not tender. Cervix exhibits discharge (moderate amount of thin white discharge noted at the cervical os and in the vagina). Cervix exhibits no motion tenderness and no friability. Right adnexum displays no mass and no tenderness. Left adnexum displays no mass and no tenderness.  Musculoskeletal: She exhibits no edema and no tenderness.       Cervical back: She exhibits tenderness and pain (very mild tenderness to palpation). She exhibits no bony tenderness, no swelling, no edema and no spasm.       Thoracic back: She exhibits tenderness and pain (very mild tenderness to palpation). She exhibits no bony tenderness, no swelling, no edema and no deformity.       Lumbar back: She exhibits no tenderness, no bony tenderness, no swelling, no edema, no deformity, no laceration, no pain and no spasm.  Neurological: She is alert and oriented to person, place, and time.  Skin: Skin is warm and dry. No erythema.  Psychiatric: She has a normal mood and affect.   Results for orders placed during the hospital encounter of 06/30/12 (from the past 24 hour(s))  URINALYSIS, ROUTINE W REFLEX MICROSCOPIC     Status: Abnormal   Collection Time    06/30/12 12:10 AM      Result Value Range   Color, Urine YELLOW  YELLOW   APPearance CLEAR  CLEAR   Specific Gravity, Urine >1.030 (*) 1.005 - 1.030   pH 5.5  5.0 - 8.0   Glucose, UA NEGATIVE  NEGATIVE mg/dL   Hgb urine dipstick TRACE (*) NEGATIVE   Bilirubin Urine NEGATIVE  NEGATIVE   Ketones, ur NEGATIVE  NEGATIVE mg/dL   Protein, ur NEGATIVE  NEGATIVE mg/dL   Urobilinogen, UA 0.2  0.0 - 1.0 mg/dL   Nitrite NEGATIVE  NEGATIVE   Leukocytes, UA NEGATIVE  NEGATIVE  URINE  MICROSCOPIC-ADD ON     Status: None   Collection Time    06/30/12 12:10 AM      Result Value Range   Squamous Epithelial / LPF RARE  RARE   WBC, UA 0-2  <3 WBC/hpf   RBC / HPF 0-2  <3 RBC/hpf   Bacteria, UA RARE  RARE  POCT PREGNANCY, URINE     Status: None   Collection Time    06/30/12  1:31 AM      Result Value Range   Preg Test, Ur NEGATIVE  NEGATIVE  WET PREP, GENITAL     Status: Abnormal   Collection Time    06/30/12  1:45 AM      Result Value Range  Yeast Wet Prep HPF POC NONE SEEN  NONE SEEN   Trich, Wet Prep NONE SEEN  NONE SEEN   Clue Cells Wet Prep HPF POC FEW (*) NONE SEEN   WBC, Wet Prep HPF POC FEW (*) NONE SEEN   EKG - normal sinus rhythm with ST elevation, most likely due to early repolarization Called for EKG reading from [facility] physician - she agrees with the EKG reading as above. Official document will be scanned into Epic  MAU Course  Procedures None  MDM Patient's chest and back pain are helped by Flexeril and Pepcid in MAU today. EKG - WNL. Patient is stable. Will treat GYN issues today. Contact information given for PCP with [facility].   Assessment and Plan  A: Irregular menses Muscular strain of back and chest Indigestion with acid reflux  Bacterial vaginosis  P: Discharge home Rx for Flexeril, prevacid and Flagyl sent to patient's pharmacy Discussed hygiene products and probiotics for avoiding recurrence Referral to [facility] clinic to establish GYN care for recurrent BV and further work-up of irregular menses Contact information for [facility] given to patient. Patient to call to establish care with PCP  Patient may return to MAU as needed or if her condition were to change or worsen  Freddi Starr, PA-C 06/30/2012, 1:36 AM

## 2012-06-30 NOTE — MAU Note (Addendum)
Pt states she has been spotting for about 4 days, pt states she is having some cramping, chest pain and back pain started about 1pm. " I don't know if it is because I ate some onions or not"

## 2012-07-01 LAB — GC/CHLAMYDIA PROBE AMP
CT Probe RNA: NEGATIVE
GC Probe RNA: NEGATIVE

## 2012-07-05 NOTE — MAU Provider Note (Signed)
Attestation of Attending Supervision of Advanced Practitioner: Evaluation and management procedures were performed by the PA/NP/CNM/OB Fellow under my supervision/collaboration. Chart reviewed and agree with management and plan.  FERGUSON,JOHN V 07/05/2012 3:00 PM

## 2012-07-20 ENCOUNTER — Encounter: Payer: Self-pay | Admitting: Obstetrics and Gynecology

## 2012-07-22 ENCOUNTER — Encounter: Payer: Self-pay | Admitting: Obstetrics & Gynecology

## 2012-08-11 ENCOUNTER — Encounter: Payer: Self-pay | Admitting: Obstetrics and Gynecology

## 2012-09-08 ENCOUNTER — Encounter: Payer: Self-pay | Admitting: Obstetrics and Gynecology

## 2012-09-08 ENCOUNTER — Ambulatory Visit (INDEPENDENT_AMBULATORY_CARE_PROVIDER_SITE_OTHER): Payer: Self-pay | Admitting: Obstetrics and Gynecology

## 2012-09-08 VITALS — BP 130/88 | HR 77 | Ht 63.0 in | Wt 227.8 lb

## 2012-09-08 DIAGNOSIS — N926 Irregular menstruation, unspecified: Secondary | ICD-10-CM

## 2012-09-08 DIAGNOSIS — N939 Abnormal uterine and vaginal bleeding, unspecified: Secondary | ICD-10-CM

## 2012-09-08 NOTE — Progress Notes (Signed)
  Subjective:    Patient ID: Julie Hendrix, female    DOB: January 15, 1986, 27 y.o.   MRN: [id number]  HPI  27 yo G3P2012 presenting today as a referral from health department for evaluation of abnormal uterine bleeding and ? Fibroid uterus. Patient states that she was on Depo-Provera a year ago and was amenorrheic and self-discontinued as she was trying to conceive. Patient now states that her cycles are very heavy but well controlled on OCP. She states that since discontinuing depo-provera she has had irregular cycles where she skips a few months before having a cycle. This pattern makes it hard for her to predict ovulation and hence hinders her goal of conception. Patient is otherwise without complaints.   Past Medical History  Diagnosis Date  . Medical history non-contributory    Past Surgical History  Procedure Laterality Date  . No past surgeries     Family History  Problem Relation Age of Onset  . Hypertension Father   . Diabetes Father   . Hypertension Mother    History  Substance Use Topics  . Smoking status: Former Smoker -- 0.50 packs/day    Quit date: 01/31/2012  . Smokeless tobacco: Not on file  . Alcohol Use: Yes     Comment: occasionally     Review of Systems  All other systems reviewed and are negative.       Objective:   Physical Exam  GENERAL: Well-developed, well-nourished female in no acute distress.  HEENT: Normocephalic, atraumatic. Sclerae anicteric.  ABDOMEN: Soft, nontender, nondistended. Obese. PELVIC: Normal external female genitalia. Vagina is pink and rugated.  Normal discharge. Normal appearing cervix. Bimanual exam limited secondary to body habitus EXTREMITIES: No cyanosis, clubbing, or edema, 2+ distal pulses.     Assessment & Plan:  27 yo with abnormal uterine bleeding currently medically managed with OCP - Will obtain pelvic ultrasound to rule out uterine pathology - Advised patient to continue OCP until ultrasound. If ultrasound is  normal, patient can discontinue OCP and attempt conception again with the aid of ovulation kits - Patient will be contacted with any abnormal results

## 2012-09-16 ENCOUNTER — Ambulatory Visit (HOSPITAL_COMMUNITY)
Admission: RE | Admit: 2012-09-16 | Discharge: 2012-09-16 | Disposition: A | Discharge status: Home / Self Care | Payer: Self-pay | Source: Ambulatory Visit | Attending: Obstetrics and Gynecology | Admitting: Obstetrics and Gynecology

## 2012-09-16 DIAGNOSIS — N939 Abnormal uterine and vaginal bleeding, unspecified: Secondary | ICD-10-CM

## 2012-09-16 DIAGNOSIS — N938 Other specified abnormal uterine and vaginal bleeding: Secondary | ICD-10-CM | POA: Insufficient documentation

## 2012-09-16 DIAGNOSIS — N949 Unspecified condition associated with female genital organs and menstrual cycle: Secondary | ICD-10-CM | POA: Insufficient documentation

## 2012-09-20 ENCOUNTER — Telehealth: Payer: Self-pay | Admitting: General Practice

## 2012-09-20 NOTE — Telephone Encounter (Signed)
Called patient and informed her of results and recommendations. Patient was satisfied, verbalized understanding and had no further questions

## 2012-09-20 NOTE — Telephone Encounter (Signed)
Message copied by Kathee Delton on Tue Sep 20, 2012 10:02 AM ------      Message from: CONSTANT, PEGGY      Created: Mon Sep 19, 2012  1:28 PM       Please inform patient that her ultrasound is normal. She may discontinue birth control and use over the counter ovulation kits if she wishes to conceive            Peggy ------

## 2012-10-21 ENCOUNTER — Encounter (HOSPITAL_COMMUNITY): Payer: Self-pay | Admitting: *Deleted

## 2012-10-21 ENCOUNTER — Emergency Department (HOSPITAL_COMMUNITY)
Admission: EM | Admit: 2012-10-21 | Discharge: 2012-10-21 | Disposition: A | Discharge status: Home / Self Care | Payer: Self-pay | Attending: Emergency Medicine | Admitting: Emergency Medicine

## 2012-10-21 ENCOUNTER — Emergency Department (HOSPITAL_COMMUNITY): Payer: Self-pay

## 2012-10-21 DIAGNOSIS — Y939 Activity, unspecified: Secondary | ICD-10-CM | POA: Insufficient documentation

## 2012-10-21 DIAGNOSIS — S8391XA Sprain of unspecified site of right knee, initial encounter: Secondary | ICD-10-CM

## 2012-10-21 DIAGNOSIS — Z87891 Personal history of nicotine dependence: Secondary | ICD-10-CM | POA: Insufficient documentation

## 2012-10-21 DIAGNOSIS — IMO0002 Reserved for concepts with insufficient information to code with codable children: Secondary | ICD-10-CM | POA: Insufficient documentation

## 2012-10-21 DIAGNOSIS — X503XXA Overexertion from repetitive movements, initial encounter: Secondary | ICD-10-CM | POA: Insufficient documentation

## 2012-10-21 DIAGNOSIS — Y929 Unspecified place or not applicable: Secondary | ICD-10-CM | POA: Insufficient documentation

## 2012-10-21 MED ORDER — HYDROCODONE-ACETAMINOPHEN 5-325 MG PO TABS
1.0000 | ORAL_TABLET | Freq: Once | ORAL | Status: AC
  Administered 2012-10-21: 1 via ORAL
  Filled 2012-10-21: qty 1

## 2012-10-21 MED ORDER — IBUPROFEN 800 MG PO TABS
800.0000 mg | ORAL_TABLET | Freq: Once | ORAL | Status: AC
  Administered 2012-10-21: 800 mg via ORAL
  Filled 2012-10-21: qty 1

## 2012-10-21 MED ORDER — NAPROXEN 500 MG PO TABS: 500.0000 mg | ORAL_TABLET | Freq: Two times a day (BID) | ORAL | Status: DC

## 2012-10-21 MED ORDER — TRAMADOL HCL 50 MG PO TABS: 50.0000 mg | ORAL_TABLET | Freq: Four times a day (QID) | ORAL | Status: DC | PRN

## 2012-10-21 NOTE — ED Notes (Signed)
Ortho tech paged; awaiting return call 

## 2012-10-21 NOTE — ED Notes (Signed)
Victorino Dike, Ortho tech will see pt in ED

## 2012-10-21 NOTE — ED Provider Notes (Signed)
History    CSN: [id number] Arrival date & time 10/21/12  0631  First MD Initiated Contact with Patient 10/21/12 [phone]     Chief Complaint  Patient presents with  . Knee Pain   (Consider location/radiation/quality/duration/timing/severity/associated sxs/prior Treatment) HPI Julie Hendrix is a 27 y.o. female who presents to ED with complaint of right knee pain. Pt states she was getting off the couch from prone position, when she got onto her knees felt a 'pop' in right knee. States now pain in the right knee. Unable to walk on it. Pain with movement. States elevated leg at home with no relief. No medications taken for this. No other complaints.   Past Medical History  Diagnosis Date  . Medical history non-contributory    Past Surgical History  Procedure Laterality Date  . No past surgeries     Family History  Problem Relation Age of Onset  . Hypertension Father   . Diabetes Father   . Hypertension Mother    History  Substance Use Topics  . Smoking status: Former Smoker -- 0.50 packs/day    Quit date: 01/31/2012  . Smokeless tobacco: Not on file  . Alcohol Use: Yes     Comment: occasionally   OB History   Grav Para Term Preterm Abortions TAB SAB Ect Mult Living   3 2 2  0 1 0 0 0 0 2     Review of Systems  Constitutional: Negative for fever and chills.  HENT: Negative for neck pain and neck stiffness.   Musculoskeletal: Positive for joint swelling and arthralgias.  Neurological: Negative for weakness and numbness.    Allergies  Review of patient's allergies indicates no known allergies.  Home Medications  No current outpatient prescriptions on file. BP 119/78  Pulse 67  Temp(Src) 97.3 F (36.3 C) (Oral)  Resp 17  SpO2 96% Physical Exam  Nursing note and vitals reviewed. Constitutional: She is oriented to person, place, and time. She appears well-developed and well-nourished. No distress.  Cardiovascular: Normal rate, regular rhythm and normal heart sounds.    Pulmonary/Chest: Effort normal and breath sounds normal. No respiratory distress. She has no wheezes. She has no rales.  Musculoskeletal:  Normal appearing right knee. Tender to palpation over lateral joint. Full ROM of the knee joint. Pain with full flexion and extension. Negative anterior and posterior drawer signs. No laxity or pain with medial or lateral stress. Dorsal pedal pulses normal   Neurological: She is alert and oriented to person, place, and time.  Skin: Skin is warm and dry.    ED Course  Procedures (including critical care time) Labs Reviewed - No data to display Dg Knee Complete 4 Views Right  10/21/2012   *RADIOLOGY REPORT*  Clinical Data: Fall, right knee pain  RIGHT KNEE - COMPLETE 4+ VIEW  Comparison: 11/01/2011  Findings: No fracture or dislocation is seen.  The joint spaces are essentially preserved.  The visualized soft tissues are unremarkable.  No suprapatellar knee joint effusion.  IMPRESSION: No fracture or dislocation is seen.   Original Report Authenticated By: Charline Bills, M.D.   1. Right knee sprain, initial encounter     MDM  Pt with right knee pain after getting onto her knee while trying to stand up. Pt unable to bear weight. X-ray negative. Joint is stable. Question a sprain vs possible meniscal injury. Given the amount of pain that pt is in, immobilizer provided and crutches. Will d/c home with naprosyn and ultram. Follow up with orthopedics.  Filed Vitals:   10/21/12 0637 10/21/12 0828  BP: 119/78 122/84  Pulse: 67 65  Temp: 97.3 F (36.3 C)   TempSrc: Oral   Resp: 17 16  SpO2: 96% 99%     Mallorie Norrod A Carlin Attridge, PA-C 10/21/12 1229

## 2012-10-21 NOTE — Progress Notes (Signed)
Orthopedic Tech Progress Note Patient Details:  Julie Hendrix [birth year] [id number]  Ortho Devices Type of Ortho Device: Knee Immobilizer;Crutches Ortho Device/Splint Interventions: Application   Cammer, Julie Hendrix 10/21/2012, 8:03 AM

## 2012-10-21 NOTE — ED Notes (Signed)
Pt states she got up from her couch yesterday morning. Pt states she felt a pop when she put weight on her right knee. Pt states she now is walking with a limp but can put minimal weight on the right leg. No swelling or deformity noted to right knee. Pt reports pain to the lateral side of her knee

## 2012-10-21 NOTE — ED Provider Notes (Signed)
Medical screening examination/treatment/procedure(s) were performed by non-physician practitioner and as supervising physician I was immediately available for consultation/collaboration.   Godson Pollan H Dagon Budai, MD 10/21/12 1542 

## 2012-10-21 NOTE — ED Notes (Signed)
Pt has returned from being out of the department; family at bedside

## 2012-10-21 NOTE — ED Notes (Signed)
Ortho at bedside.

## 2012-11-10 ENCOUNTER — Encounter (HOSPITAL_COMMUNITY): Payer: Self-pay | Admitting: Emergency Medicine

## 2012-11-10 ENCOUNTER — Emergency Department (HOSPITAL_COMMUNITY)
Admission: EM | Admit: 2012-11-10 | Discharge: 2012-11-10 | Disposition: A | Discharge status: Home / Self Care | Payer: Self-pay | Attending: Emergency Medicine | Admitting: Emergency Medicine

## 2012-11-10 DIAGNOSIS — Z79899 Other long term (current) drug therapy: Secondary | ICD-10-CM | POA: Insufficient documentation

## 2012-11-10 DIAGNOSIS — K029 Dental caries, unspecified: Secondary | ICD-10-CM | POA: Insufficient documentation

## 2012-11-10 DIAGNOSIS — Z87891 Personal history of nicotine dependence: Secondary | ICD-10-CM | POA: Insufficient documentation

## 2012-11-10 MED ORDER — AMOXICILLIN 500 MG PO CAPS: 500.0000 mg | ORAL_CAPSULE | Freq: Three times a day (TID) | ORAL | Status: DC

## 2012-11-10 MED ORDER — OXYCODONE-ACETAMINOPHEN 5-325 MG PO TABS: ORAL_TABLET | ORAL | Status: DC

## 2012-11-10 NOTE — ED Provider Notes (Signed)
History    CSN: [id number] Arrival date & time 11/10/12  0207  First MD Initiated Contact with Patient 11/10/12 0321     Chief Complaint  Patient presents with  . Dental Pain   (Consider location/radiation/quality/duration/timing/severity/associated sxs/prior Treatment) HPI  Julie Hendrix is a 27 y.o. female complaining of right lower tooth pain he onset 2 days ago. Pain is unrelieved by [facility] powder. Patient has a aching pain rated at 10 out of 10, exacerbated by a year and to a. She denies fever, shortness of breath, abdominal or tongue swelling, difficulty swallowing.  Past Medical History  Diagnosis Date  . Medical history non-contributory    Past Surgical History  Procedure Laterality Date  . No past surgeries     Family History  Problem Relation Age of Onset  . Hypertension Father   . Diabetes Father   . Hypertension Mother    History  Substance Use Topics  . Smoking status: Former Smoker -- 0.50 packs/day    Quit date: 01/31/2012  . Smokeless tobacco: Not on file  . Alcohol Use: Yes     Comment: occasionally   OB History   Grav Para Term Preterm Abortions TAB SAB Ect Mult Living   3 2 2  0 1 0 0 0 0 2     Review of Systems  Constitutional: Negative for fever.       Negative except as described in HPI  HENT: Positive for dental problem. Negative for ear pain, facial swelling, drooling, trouble swallowing, neck pain, neck stiffness and voice change.        Negative except as described in HPI  Respiratory: Negative for shortness of breath.        Negative except as described in HPI  Cardiovascular: Negative for chest pain.       Negative except as described in HPI  Gastrointestinal: Negative for nausea, vomiting, abdominal pain and diarrhea.       Negative except as described in HPI  Genitourinary:       Negative except as described in HPI  Musculoskeletal:       Negative except as described in HPI  Skin:       Negative except as described in HPI   Neurological:       Negative except as described in HPI  All other systems reviewed and are negative.    Allergies  Review of patient's allergies indicates no known allergies.  Home Medications   Current Outpatient Rx  Name  Route  Sig  Dispense  Refill  . Aspirin-Salicylamide-Caffeine (BC HEADACHE PO)   Oral   Take 1 packet by mouth every 6 (six) hours as needed.         . Ibuprofen (MOTRIN PO)   Oral   Take 1 tablet by mouth every 6 (six) hours as needed.          BP 154/100  Pulse 71  Temp(Src) 98.1 F (36.7 C) (Oral)  Resp 14  SpO2 97%  LMP 10/14/2012 Physical Exam  Nursing note and vitals reviewed. Constitutional: She is oriented to person, place, and time. She appears well-developed and well-nourished. No distress.  HENT:  Head: Normocephalic.  Generally poor dentition, no gingival swelling, erythema or tenderness to palpation. Patient is handling their secretions. There is no tenderness to palpation or firmness underneath tongue bilaterally. No trismus.    Eyes: Conjunctivae and EOM are normal. Pupils are equal, round, and reactive to light.  Neck: Normal range of motion. Neck supple.  Cardiovascular: Normal rate, regular rhythm and intact distal pulses.   Pulmonary/Chest: Effort normal and breath sounds normal. No stridor. No respiratory distress. She has no wheezes. She has no rales. She exhibits no tenderness.  Musculoskeletal: Normal range of motion.  Neurological: She is alert and oriented to person, place, and time.  Psychiatric: She has a normal mood and affect.    ED Course  NERVE BLOCK Date/Time: 11/11/2012 6:27 AM Performed by: Wynetta Emery Authorized by: Wynetta Emery Consent: Verbal consent obtained. Consent given by: patient Indications: pain relief Body area: face/mouth Laterality: right Patient sedated: no Patient position: sitting Needle gauge: 27 G Local anesthetic: bupivacaine 0.5% with epinephrine Anesthetic total: 1.8  ml Outcome: pain improved   (including critical care time)   Labs Reviewed - No data to display No results found. 1. Dental caries     MDM   Filed Vitals:   11/10/12 0214  BP: 154/100  Pulse: 71  Temp: 98.1 F (36.7 C)  TempSrc: Oral  Resp: 14  SpO2: 97%     Julie Hendrix is a 27 y.o. female Patient with toothache.  No gross abscess.  Exam unconcerning for Ludwig's angina or spread of infection.  Will treat with penicillin and pain medicine.  Urged patient to follow-up with dentist.    Pt is hemodynamically stable, appropriate for, and amenable to discharge at this time. Pt verbalized understanding and agrees with care plan. All questions answered. Outpatient follow-up and specific return precautions discussed.    Discharge Medication List as of 11/10/2012  4:05 AM    START taking these medications   Details  amoxicillin (AMOXIL) 500 MG capsule Take 1 capsule (500 mg total) by mouth 3 (three) times daily., Starting 11/10/2012, Until Discontinued, Print    oxyCODONE-acetaminophen (PERCOCET/ROXICET) 5-325 MG per tablet 1 to 2 tabs PO q6hrs  PRN for pain, Print        Note: Portions of this report may have been transcribed using voice recognition software. Every effort was made to ensure accuracy; however, inadvertent computerized transcription errors may be present    Wynetta Emery, PA-C 11/11/12 0630

## 2012-11-10 NOTE — ED Notes (Signed)
PT. REPORTS RIGHT UPPER /LOWER MOLAR PAIN FOR SEVERAL DAYS UNRELIEVED BY OTC BC POWDER.

## 2012-11-12 NOTE — ED Provider Notes (Signed)
Medical screening examination/treatment/procedure(s) were performed by non-physician practitioner and as supervising physician I was immediately available for consultation/collaboration.   Dietra Stokely L Elon Lomeli, MD 11/12/12 2239 

## 2013-01-24 ENCOUNTER — Ambulatory Visit (HOSPITAL_COMMUNITY): Payer: Self-pay

## 2013-01-30 ENCOUNTER — Other Ambulatory Visit (HOSPITAL_COMMUNITY): Payer: Self-pay | Admitting: *Deleted

## 2013-01-30 DIAGNOSIS — N63 Unspecified lump in unspecified breast: Secondary | ICD-10-CM

## 2013-01-31 ENCOUNTER — Encounter (HOSPITAL_COMMUNITY): Payer: Self-pay

## 2013-01-31 ENCOUNTER — Encounter (INDEPENDENT_AMBULATORY_CARE_PROVIDER_SITE_OTHER): Payer: Self-pay

## 2013-01-31 ENCOUNTER — Ambulatory Visit (HOSPITAL_COMMUNITY)
Admission: RE | Admit: 2013-01-31 | Discharge: 2013-01-31 | Disposition: A | Discharge status: Home / Self Care | Payer: Medicaid Other | Source: Ambulatory Visit | Attending: Obstetrics and Gynecology | Admitting: Obstetrics and Gynecology

## 2013-01-31 VITALS — BP 124/80 | Temp 98.0°F | Ht 64.0 in | Wt 244.0 lb

## 2013-01-31 DIAGNOSIS — Z1239 Encounter for other screening for malignant neoplasm of breast: Secondary | ICD-10-CM

## 2013-01-31 DIAGNOSIS — N6311 Unspecified lump in the right breast, upper outer quadrant: Secondary | ICD-10-CM | POA: Insufficient documentation

## 2013-01-31 NOTE — Patient Instructions (Signed)
Taught [employer] how to perform BSE and gave educational materials to take home. Patient did not need a Pap smear today due to last Pap smear was May 2013 per patient. Told patient about free cervical cancer screenings to receive a Pap smear if would like one next year. Let her know [facility] will cover Pap smears every 3 years unless has a history of abnormal Pap smears. Referred patient to the [facility] for right breast ultrasound. Appointment scheduled for Friday, February 03, 2013 at 1115. Patient aware of appointment and will be there. Shanese Nobile verbalized understanding.  Lionell Matuszak, Kathaleen Maser, RN 8:34 AM

## 2013-01-31 NOTE — Progress Notes (Addendum)
Complaints of right breast lump x 2 weeks. Patient stated there was a bruise where lump is when first noticed lump. Patient stated she thinks the lump has decreased in size since she first noticed.  Pap Smear:    Pap smear not completed today. Last Pap smear was may 2013 at the [facility] and normal per patient. Per patient has a history of an abnormal Pap smear in 2001 that required a colposcopy for follow up. Per patient all Pap smears have been normal since colposcopy. No Pap smear results in EPIC.  Physical exam: Breasts Breasts symmetrical. No skin abnormalities bilateral breasts. No bruises observed on exam. No nipple retraction bilateral breasts. No nipple discharge bilateral breasts. No lymphadenopathy. No lumps palpated left breast. Palpated lump within the right breast at 10 o'clock 8 cm from the nipple. No complaints of pain or tenderness on exam. Referred patient to the [facility] for right breast ultrasound. Appointment scheduled for Friday, February 03, 2013 at 1115.  Pelvic/Bimanual No Pap smear completed today since last Pap smear was May 2013. Pap smear not indicated per [facility] guidelines.

## 2013-02-03 ENCOUNTER — Ambulatory Visit
Admission: RE | Admit: 2013-02-03 | Discharge: 2013-02-03 | Disposition: A | Discharge status: Home / Self Care | Payer: Medicaid Other | Source: Ambulatory Visit | Attending: Obstetrics and Gynecology | Admitting: Obstetrics and Gynecology

## 2013-02-03 ENCOUNTER — Other Ambulatory Visit (HOSPITAL_COMMUNITY): Payer: Self-pay | Admitting: Obstetrics and Gynecology

## 2013-02-03 DIAGNOSIS — N63 Unspecified lump in unspecified breast: Secondary | ICD-10-CM

## 2013-03-02 ENCOUNTER — Emergency Department (HOSPITAL_COMMUNITY)
Admission: EM | Admit: 2013-03-02 | Discharge: 2013-03-02 | Disposition: A | Discharge status: Home / Self Care | Payer: Medicaid Other | Attending: Emergency Medicine | Admitting: Emergency Medicine

## 2013-03-02 DIAGNOSIS — G8911 Acute pain due to trauma: Secondary | ICD-10-CM | POA: Insufficient documentation

## 2013-03-02 DIAGNOSIS — Z792 Long term (current) use of antibiotics: Secondary | ICD-10-CM | POA: Insufficient documentation

## 2013-03-02 DIAGNOSIS — K029 Dental caries, unspecified: Secondary | ICD-10-CM | POA: Insufficient documentation

## 2013-03-02 DIAGNOSIS — M542 Cervicalgia: Secondary | ICD-10-CM | POA: Insufficient documentation

## 2013-03-02 DIAGNOSIS — Z87891 Personal history of nicotine dependence: Secondary | ICD-10-CM | POA: Insufficient documentation

## 2013-03-02 DIAGNOSIS — K0889 Other specified disorders of teeth and supporting structures: Secondary | ICD-10-CM

## 2013-03-02 DIAGNOSIS — K089 Disorder of teeth and supporting structures, unspecified: Secondary | ICD-10-CM | POA: Insufficient documentation

## 2013-03-02 MED ORDER — OXYCODONE-ACETAMINOPHEN 5-325 MG PO TABS: 1.0000 | ORAL_TABLET | Freq: Four times a day (QID) | ORAL | Status: DC | PRN

## 2013-03-02 MED ORDER — PENICILLIN V POTASSIUM 500 MG PO TABS: 500.0000 mg | ORAL_TABLET | Freq: Four times a day (QID) | ORAL | Status: AC

## 2013-03-02 NOTE — ED Notes (Signed)
Pt c/o R side mouth pain for 2 days. Pt states she has a knot under her chin also which she feels is related to the dental pain. Pt reports that she has been taking amoxicillin for the past 4 days. States she needs a dental referral. Pt with no acute distress.

## 2013-03-02 NOTE — ED Provider Notes (Signed)
CSN: [id number]     Arrival date & time 03/02/13  2312 History   First MD Initiated Contact with Patient 03/02/13 2316     This chart was scribed for Antony Madura, by Ladona Ridgel Day, ED scribe. This patient was seen in room WTR5/WTR5 and the patient's care was started at 2316.  Chief Complaint  Patient presents with  . Dental Pain   The history is provided by the patient. No language interpreter was used.   HPI Comments: Julie Hendrix is a 27 y.o. female who presents to the Emergency Department complaining of right lower tooth pain which she attributes to a tooth fracture, onset 3 days ago. She states similar to a previous episode w/a different tooth. She states pain when opening her jaw and some associated neck pain but no neck stiffness. She has been on AMX for past x4 days. She states handling secretions normally while she is awake but drools more than normal while sleeping. She denies any SOB or fever, an inability to swallow, and oral bleeding.   Past Medical History  Diagnosis Date  . Medical history non-contributory    Past Surgical History  Procedure Laterality Date  . No past surgeries     Family History  Problem Relation Age of Onset  . Hypertension Father   . Diabetes Father    History  Substance Use Topics  . Smoking status: Former Smoker -- 0.50 packs/day    Quit date: 01/01/2012  . Smokeless tobacco: Not on file  . Alcohol Use: Yes     Comment: occasionally   OB History   Grav Para Term Preterm Abortions TAB SAB Ect Mult Living   3 2 2  0 1 0 0 0 0 2     Review of Systems  Constitutional: Negative for fever and chills.  HENT: Positive for dental problem (right lower tooth pain).   Respiratory: Negative for shortness of breath.   Gastrointestinal: Negative for nausea and vomiting.  Neurological: Negative for weakness.  All other systems reviewed and are negative.  A complete 10 system review of systems was obtained and all systems are negative except as noted in  the HPI and PMH.    Allergies  Review of patient's allergies indicates no known allergies.  Home Medications   Current Outpatient Rx  Name  Route  Sig  Dispense  Refill  . amoxicillin (AMOXIL) 500 MG capsule   Oral   Take 1 capsule (500 mg total) by mouth 3 (three) times daily.   30 capsule   0   . Ibuprofen (MOTRIN PO)   Oral   Take 600 tablets by mouth every 6 (six) hours as needed. For pain          Triage Vitals: BP 140/77  Pulse 78  Temp(Src) 97.7 F (36.5 C) (Oral)  Resp 16  SpO2 98%  LMP 02/01/2013  Physical Exam  Nursing note and vitals reviewed. Constitutional: She is oriented to person, place, and time. She appears well-developed and well-nourished. No distress.  HENT:  Head: Normocephalic and atraumatic.  Right Ear: Tympanic membrane, external ear and ear canal normal. No mastoid tenderness.  Left Ear: Tympanic membrane, external ear and ear canal normal. No mastoid tenderness.  Mouth/Throat: Uvula is midline, oropharynx is clear and moist and mucous membranes are normal. No oral lesions. No trismus in the jaw. Abnormal dentition. Dental caries present. No uvula swelling. No oropharyngeal exudate.  Tenderness to palpation of her right lower 1st molar. No fluctuance. Mild gingival swelling  w/out erythema. No oral lesions or bleeding. Uvula midline.  Eyes: Conjunctivae and EOM are normal. No scleral icterus.  Neck: Neck supple. No tracheal deviation present.  Cardiovascular: Normal rate, regular rhythm and normal heart sounds.   Pulmonary/Chest: Effort normal. No stridor. No respiratory distress. She has no wheezes.  Musculoskeletal: Normal range of motion.  Neurological: She is alert and oriented to person, place, and time.  Skin: Skin is warm and dry.  Psychiatric: She has a normal mood and affect. Her behavior is normal.    ED Course  Procedures (including critical care time) DIAGNOSTIC STUDIES: Oxygen Saturation is 98% on room air, normal by my  interpretation.    COORDINATION OF CARE: At 1140 PM Discussed treatment plan with patient which includes referral to dentist. Patient agrees.   Labs Review Labs Reviewed - No data to display Imaging Review No results found.  EKG Interpretation   None       MDM   1. Dentalgia    Uncomplicated dentalgia. Patient well and nontoxic appearing, hemodynamically stable, and afebrile. No trismus, stridor, or area of fluctuance. No facial swelling or neck stiffness. Uvula midline and patient tolerating secretions without difficulty. Patient stable and appropriate for d/c with dental f/u; resources provided. Have advised changing from AMOX to Pen VK; script provided. Short course of percocet given for breakthrough pain. Return precautions discussed and patient agreeable to plan with no unaddressed concerns.  I personally performed the services described in this documentation, which was scribed in my presence. The recorded information has been reviewed and is accurate.       Antony Madura, PA-C 03/03/13 2215

## 2013-03-04 NOTE — ED Provider Notes (Signed)
Medical screening examination/treatment/procedure(s) were performed by non-physician practitioner and as supervising physician I was immediately available for consultation/collaboration.  Lalisa Kiehn, MD 03/04/13 2236 

## 2013-04-03 ENCOUNTER — Inpatient Hospital Stay (HOSPITAL_COMMUNITY)
Admission: AD | Admit: 2013-04-03 | Discharge: 2013-04-03 | Disposition: A | Discharge status: Home / Self Care | Payer: Medicaid Other | Source: Ambulatory Visit | Attending: Obstetrics & Gynecology | Admitting: Obstetrics & Gynecology

## 2013-04-03 ENCOUNTER — Inpatient Hospital Stay (HOSPITAL_COMMUNITY): Payer: Medicaid Other

## 2013-04-03 ENCOUNTER — Encounter (HOSPITAL_COMMUNITY): Payer: Self-pay | Admitting: *Deleted

## 2013-04-03 DIAGNOSIS — N921 Excessive and frequent menstruation with irregular cycle: Secondary | ICD-10-CM

## 2013-04-03 DIAGNOSIS — D649 Anemia, unspecified: Secondary | ICD-10-CM | POA: Insufficient documentation

## 2013-04-03 DIAGNOSIS — N920 Excessive and frequent menstruation with regular cycle: Secondary | ICD-10-CM | POA: Insufficient documentation

## 2013-04-03 DIAGNOSIS — N979 Female infertility, unspecified: Secondary | ICD-10-CM | POA: Insufficient documentation

## 2013-04-03 DIAGNOSIS — N898 Other specified noninflammatory disorders of vagina: Secondary | ICD-10-CM | POA: Insufficient documentation

## 2013-04-03 HISTORY — DX: Unspecified abnormal cytological findings in specimens from cervix uteri: R87.619

## 2013-04-03 HISTORY — DX: Reserved for concepts with insufficient information to code with codable children: IMO0002

## 2013-04-03 LAB — CBC
HCT: 33.6 % — ABNORMAL LOW (ref 36.0–46.0)
Hemoglobin: 10.9 g/dL — ABNORMAL LOW (ref 12.0–15.0)
MCV: 83.4 fL (ref 78.0–100.0)
RBC: 4.03 MIL/uL (ref 3.87–5.11)
WBC: 10.4 10*3/uL (ref 4.0–10.5)

## 2013-04-03 MED ORDER — MEGESTROL ACETATE 40 MG PO TABS: 40.0000 mg | ORAL_TABLET | Freq: Every day | ORAL | Status: DC

## 2013-04-03 NOTE — MAU Note (Signed)
Pt LMP 03/04/2013, bleeding continues, passing clots, soaking 10 or more pads a day.

## 2013-04-03 NOTE — MAU Provider Note (Signed)
History     CSN: [id number]  Arrival date and time: 04/03/13 1914   None     Chief Complaint  Patient presents with  . Vaginal Bleeding   HPI This is a 27 y.o. female who presents with c/o heavy vaginal bleeding for 30 days States has been heavy the whole time. Saw Dr Jolayne Panther in May 2014 for this but "everything was normal. They wanted to put me on birth control, but I want to get pregnant". Has been trying for a year without success. Has had successful pregnancies in past.  Has a physical exam scheduled for this Friday   RN Note  Pt LMP 03/04/2013, bleeding continues, passing clots, soaking 10 or more pads a day.      OB History   Grav Para Term Preterm Abortions TAB SAB Ect Mult Living   3 2 2  0 1 0 0 0 0 2      Past Medical History  Diagnosis Date  . Medical history non-contributory     Past Surgical History  Procedure Laterality Date  . No past surgeries      Family History  Problem Relation Age of Onset  . Hypertension Father   . Diabetes Father     History  Substance Use Topics  . Smoking status: Former Smoker -- 0.50 packs/day    Quit date: 01/01/2012  . Smokeless tobacco: Not on file  . Alcohol Use: Yes     Comment: occasionally    Allergies: No Known Allergies  Prescriptions prior to admission  Medication Sig Dispense Refill  . Ibuprofen (MOTRIN PO) Take 600 tablets by mouth every 6 (six) hours as needed. For pain      . oxyCODONE-acetaminophen (PERCOCET/ROXICET) 5-325 MG per tablet Take 1 tablet by mouth every 6 (six) hours as needed for severe pain. Take one tab every 4-6 hours for pain.  11 tablet  0    Review of Systems  Constitutional: Negative for fever and chills.  Gastrointestinal: Negative for nausea, vomiting, abdominal pain, diarrhea and constipation.  Genitourinary: Negative for dysuria.  Neurological: Negative for headaches.   Physical Exam   Blood pressure 128/70, pulse 64, temperature 98.2 F (36.8 C), resp. rate 16,  height 5\' 3"  (1.6 m), weight 113.671 kg (250 lb 9.6 oz), last menstrual period 03/04/2013.  Physical Exam  Constitutional: She is oriented to person, place, and time. She appears well-developed and well-nourished. No distress.  HENT:  Head: Normocephalic.  Cardiovascular: Normal rate.   Respiratory: Effort normal.  GI: Soft. She exhibits no distension and no mass. There is tenderness (slight tenderness over suprapubic area). There is no rebound and no guarding.  Genitourinary: Uterus normal. Vaginal discharge (moderate blood in vault) found.  Musculoskeletal: Normal range of motion.  Neurological: She is alert and oriented to person, place, and time.  Skin: Skin is warm and dry.  Psychiatric: She has a normal mood and affect.    MAU Course  Procedures  MDM Will check CBC, pregnancy test (RN threw out urine), [place] and TSH.  Results for orders placed during the hospital encounter of 04/03/13 (from the past 24 hour(s))  CBC     Status: Abnormal   Collection Time    04/03/13  8:10 PM      Result Value Range   WBC 10.4  4.0 - 10.5 K/uL   RBC 4.03  3.87 - 5.11 MIL/uL   Hemoglobin 10.9 (*) 12.0 - 15.0 g/dL   HCT 40.9 (*) 81.1 - 91.4 %  MCV 83.4  78.0 - 100.0 fL   MCH 27.0  26.0 - 34.0 pg   MCHC 32.4  30.0 - 36.0 g/dL   RDW 96.0  45.4 - 09.8 %   Platelets 240  150 - 400 K/uL  HCG, SERUM, QUALITATIVE     Status: None   Collection Time    04/03/13  8:10 PM      Result Value Range   Preg, Serum NEGATIVE  NEGATIVE   US shows normal uterus and endometrial thickness of 8mm Right ovarian simple cyst  TSH pending  Assessment and Plan  A:  Menometrorrhagia      Mild anemia (4 yrs ago was 12, now 10.9)      Obesity      Secondary infertility  P:  Discussed results      Will rx Megace to slow bleeding until can be seen in clinic       Will arrange followup appointment in clinic  [facility] 04/03/2013, 7:52 PM

## 2013-04-04 LAB — TSH: TSH: 1.313 u[IU]/mL (ref 0.350–4.500)

## 2013-05-04 ENCOUNTER — Encounter: Payer: No Typology Code available for payment source | Admitting: Obstetrics and Gynecology

## 2013-06-05 ENCOUNTER — Ambulatory Visit (INDEPENDENT_AMBULATORY_CARE_PROVIDER_SITE_OTHER): Payer: Medicaid Other | Admitting: Obstetrics & Gynecology

## 2013-06-05 ENCOUNTER — Encounter: Payer: Self-pay | Admitting: Obstetrics & Gynecology

## 2013-06-05 VITALS — BP 114/69 | HR 72 | Temp 96.9°F | Ht 63.0 in | Wt 256.3 lb

## 2013-06-05 DIAGNOSIS — N926 Irregular menstruation, unspecified: Secondary | ICD-10-CM

## 2013-06-05 DIAGNOSIS — N898 Other specified noninflammatory disorders of vagina: Secondary | ICD-10-CM

## 2013-06-05 DIAGNOSIS — N939 Abnormal uterine and vaginal bleeding, unspecified: Secondary | ICD-10-CM

## 2013-06-05 LAB — WET PREP, GENITAL
Trich, Wet Prep: NONE SEEN
YEAST WET PREP: NONE SEEN

## 2013-06-05 LAB — HEMOGLOBIN A1C
HEMOGLOBIN A1C: 5.7 % — AB (ref <5.7)
Mean Plasma Glucose: 117 mg/dL — ABNORMAL HIGH (ref <117)

## 2013-06-05 LAB — POCT PREGNANCY, URINE: PREG TEST UR: NEGATIVE

## 2013-06-05 MED ORDER — METRONIDAZOLE 0.75 % VA GEL: 1.0000 | Freq: Every day | VAGINAL | Status: DC

## 2013-06-05 NOTE — Patient Instructions (Signed)
Thank you for enrolling in MyChart. Please follow the instructions below to securely access your online medical record. MyChart allows you to send messages to your doctor, view your test results, manage appointments, and more.   How Do I Sign Up? 1. In your Internet browser, go to [employer] Address [employer] and enter [email or web address]. 2. Click on the Sign Up Now link in the Sign In box. You will see the New Member Sign Up page. 3. Enter your MyChart Access Code exactly as it appears below. You will not need to use this code after you've completed the sign-up process. If you do not sign up before the expiration date, you must request a new code.  MyChart Access Code: [email or web address] Expires: 08/04/2013  3:37 PM  4. Enter your Social Security Number ([id number]) and Date of Birth (mm/dd/yyyy) as indicated and click Submit. You will be taken to the next sign-up page. 5. Create a MyChart ID. This will be your MyChart login ID and cannot be changed, so think of one that is secure and easy to remember. 6. Create a MyChart password. You can change your password at any time. 7. Enter your Password Reset Question and Answer. This can be used at a later time if you forget your password.  8. Enter your e-mail address. You will receive e-mail notification when new information is available in MyChart. 9. Click Sign Up. You can now view your medical record.   Additional Information Remember, MyChart is NOT to be used for urgent needs. For medical emergencies, dial 911.

## 2013-06-05 NOTE — Progress Notes (Signed)
   CLINIC ENCOUNTER NOTE  History:  28 y.o. [id number] here today for evaluation of AUB and abnormal vaginal discharge. Was seen in MAU on 04/03/13, had normal ultrasound, TSH and CBC, was given Megace.  Since then, she stopped Megace after one week, but has not had any periods since then,  Long history of abnormal periods, also has secondary infertility as she has been trying to conceive for one year.    Abnormal vaginal discharge started over a week ago, foul-smelling vaginal discharge.  Reports that she always gets BV.  No other concerns.  The following portions of the patient's history were reviewed and updated as appropriate: allergies, current medications, past family history, past medical history, past social history, past surgical history and problem list.  Normal pap smear last year as per patient.  Review of Systems:  Pertinent items are noted in HPI.  Objective:  Physical Exam BP 114/69  Pulse 72  Temp(Src) 96.9 F (36.1 C) (Oral)  Ht 5\' 3"  (1.6 m)  Wt 256 lb 4.8 oz (116.257 kg)  BMI 45.41 kg/m2  LMP 03/04/2013 Gen: NAD Abd: Soft, obese,  nontender and nondistended Pelvic: Normal appearing external genitalia; normal appearing vaginal mucosa and cervix.  Yellow, foul-smelling discharge seen.  Small uterus, no other palpable masses, no uterine or adnexal tenderness.  Cultures obtained.  Labs and Imaging 04/03/13   TRANSABDOMINAL AND TRANSVAGINAL ULTRASOUND OF PELVIS CLINICAL DATA: Metromenorrhagia, bleeding for 30 days  COMPARISON: 09/16/2012 FINDINGS: Uterus Measurements: 9.9 x 4.6 x 5.3 cm. Normal morphology without mass. Endometrium Thickness: 8.1 cm length. Small amount of endocervical fluid. Nabothian cyst at cervix. No focal abnormalities otherwise seen. Right ovary Measurements: 5.1 x 3.1 x 3.4 cm. Simple cyst right ovary 3.4 x 1.8 x 3.1 cm no additional masses. Left ovary Measurements: 4.4 x 2.3 x 2.7 cm. Normal morphology without mass. Other findings No free pelvic fluid or  additional adnexal masses. IMPRESSION: Minimal nonspecific endocervical fluid. Small right ovarian cyst 3.4 cm greatest size. Otherwise negative exam. Electronically Signed By: Ulyses SouthwardMark Boles M.D. On: 04/03/2013 21:37  Assessment & Plan:  Discussed management options for abnormal uterine bleeding: recommended weight loss and this may also help ovulating.  Also discussed hormonal management, patient declines all hormones for now.  Will follow up labs and manage accordingly.  As fopr the vaginal discharge, will follow up cultures.  Metronidazole gel prescribed as per patient preference.  Proper vulvar hygiene emphasized: discussed avoidance of perfumed soaps, detergents, lotions and any type of douches; in addition to wearing cotton underwear and no underwear at night.    Routine preventative health maintenance measures emphasized.  Jaynie CollinsUGONNA  Harsh Trulock, MD, FACOG Attending Obstetrician & Gynecologist Faculty Practice, [facility]

## 2013-06-06 LAB — GC/CHLAMYDIA PROBE AMP
CT PROBE, AMP APTIMA: NEGATIVE
GC Probe RNA: NEGATIVE

## 2013-06-06 LAB — TESTOSTERONE, FREE, TOTAL, SHBG
Sex Hormone Binding: 35 nmol/L (ref 18–114)
Testosterone, Free: 7.3 pg/mL — ABNORMAL HIGH (ref 0.6–6.8)
Testosterone-% Free: 1.7 % (ref 0.4–2.4)
Testosterone: 42 ng/dL (ref 10–70)

## 2013-06-06 LAB — FOLLICLE STIMULATING HORMONE: FSH: 2 m[IU]/mL

## 2013-06-06 LAB — HCG, QUANTITATIVE, PREGNANCY

## 2013-06-06 LAB — PROLACTIN: PROLACTIN: 5.9 ng/mL

## 2013-08-01 ENCOUNTER — Encounter (HOSPITAL_COMMUNITY): Payer: Self-pay | Admitting: Emergency Medicine

## 2013-08-01 ENCOUNTER — Emergency Department (HOSPITAL_COMMUNITY)
Admission: EM | Admit: 2013-08-01 | Discharge: 2013-08-01 | Disposition: A | Discharge status: Home / Self Care | Payer: Medicaid Other | Attending: Emergency Medicine | Admitting: Emergency Medicine

## 2013-08-01 DIAGNOSIS — Y929 Unspecified place or not applicable: Secondary | ICD-10-CM | POA: Insufficient documentation

## 2013-08-01 DIAGNOSIS — R079 Chest pain, unspecified: Secondary | ICD-10-CM | POA: Insufficient documentation

## 2013-08-01 DIAGNOSIS — M79609 Pain in unspecified limb: Secondary | ICD-10-CM

## 2013-08-01 DIAGNOSIS — Z87891 Personal history of nicotine dependence: Secondary | ICD-10-CM | POA: Insufficient documentation

## 2013-08-01 DIAGNOSIS — Y939 Activity, unspecified: Secondary | ICD-10-CM | POA: Insufficient documentation

## 2013-08-01 DIAGNOSIS — T148XXA Other injury of unspecified body region, initial encounter: Secondary | ICD-10-CM

## 2013-08-01 DIAGNOSIS — J309 Allergic rhinitis, unspecified: Secondary | ICD-10-CM | POA: Insufficient documentation

## 2013-08-01 DIAGNOSIS — IMO0002 Reserved for concepts with insufficient information to code with codable children: Secondary | ICD-10-CM | POA: Insufficient documentation

## 2013-08-01 DIAGNOSIS — X58XXXA Exposure to other specified factors, initial encounter: Secondary | ICD-10-CM | POA: Insufficient documentation

## 2013-08-01 DIAGNOSIS — Z91040 Latex allergy status: Secondary | ICD-10-CM | POA: Insufficient documentation

## 2013-08-01 DIAGNOSIS — J302 Other seasonal allergic rhinitis: Secondary | ICD-10-CM

## 2013-08-01 LAB — BASIC METABOLIC PANEL
BUN: 8 mg/dL (ref 6–23)
CO2: 25 meq/L (ref 19–32)
Calcium: 8.9 mg/dL (ref 8.4–10.5)
Chloride: 101 mEq/L (ref 96–112)
Creatinine, Ser: 0.59 mg/dL (ref 0.50–1.10)
GFR calc Af Amer: >90 mL/min (ref >90)
GFR calc non Af Amer: >90 mL/min (ref >90)
Glucose, Bld: 81 mg/dL (ref 70–99)
POTASSIUM: 3.6 meq/L — AB (ref 3.7–5.3)
SODIUM: 138 meq/L (ref 137–147)

## 2013-08-01 LAB — CBC
HEMATOCRIT: 35 % — AB (ref 36.0–46.0)
Hemoglobin: 11.4 g/dL — ABNORMAL LOW (ref 12.0–15.0)
MCH: 24.3 pg — AB (ref 26.0–34.0)
MCHC: 32.6 g/dL (ref 30.0–36.0)
MCV: 74.5 fL — ABNORMAL LOW (ref 78.0–100.0)
Platelets: 221 10*3/uL (ref 150–400)
RBC: 4.7 MIL/uL (ref 3.87–5.11)
RDW: 18.7 % — ABNORMAL HIGH (ref 11.5–15.5)
WBC: 10.9 10*3/uL — ABNORMAL HIGH (ref 4.0–10.5)

## 2013-08-01 LAB — TROPONIN I: Troponin I: <0.3 ng/mL (ref <0.30)

## 2013-08-01 MED ORDER — CETIRIZINE HCL 10 MG PO TABS: 10.0000 mg | ORAL_TABLET | Freq: Every day | ORAL | Status: DC

## 2013-08-01 MED ORDER — ALBUTEROL SULFATE HFA 108 (90 BASE) MCG/ACT IN AERS
2.0000 | INHALATION_SPRAY | Freq: Four times a day (QID) | RESPIRATORY_TRACT | Status: DC | PRN
Start: 2013-08-01 — End: 2013-08-01

## 2013-08-01 NOTE — Discharge Instructions (Signed)
Allergic Rhinitis Allergic rhinitis is when the mucous membranes in the nose respond to allergens. Allergens are particles in the air that cause your body to have an allergic reaction. This causes you to release allergic antibodies. Through a chain of events, these eventually cause you to release histamine into the blood stream. Although meant to protect the body, it is this release of histamine that causes your discomfort, such as frequent sneezing, congestion, and an itchy, runny nose.  CAUSES  Seasonal allergic rhinitis (hay fever) is caused by pollen allergens that may come from grasses, trees, and weeds. Year-round allergic rhinitis (perennial allergic rhinitis) is caused by allergens such as house dust mites, pet dander, and mold spores.  SYMPTOMS   Nasal stuffiness (congestion).  Itchy, runny nose with sneezing and tearing of the eyes. DIAGNOSIS  Your health care provider can help you determine the allergen or allergens that trigger your symptoms. If you and your health care provider are unable to determine the allergen, skin or blood testing may be used. TREATMENT  Allergic Rhinitis does not have a cure, but it can be controlled by:  Medicines and allergy shots (immunotherapy).  Avoiding the allergen. Hay fever may often be treated with antihistamines in pill or nasal spray forms. Antihistamines block the effects of histamine. There are over-the-counter medicines that may help with nasal congestion and swelling around the eyes. Check with your health care provider before taking or giving this medicine.  If avoiding the allergen or the medicine prescribed do not work, there are many new medicines your health care provider can prescribe. Stronger medicine may be used if initial measures are ineffective. Desensitizing injections can be used if medicine and avoidance does not work. Desensitization is when a patient is given ongoing shots until the body becomes less sensitive to the allergen.  Make sure you follow up with your health care provider if problems continue. HOME CARE INSTRUCTIONS It is not possible to completely avoid allergens, but you can reduce your symptoms by taking steps to limit your exposure to them. It helps to know exactly what you are allergic to so that you can avoid your specific triggers. SEEK MEDICAL CARE IF:   You have a fever.  You develop a cough that does not stop easily (persistent).  You have shortness of breath.  You start wheezing.  Symptoms interfere with normal daily activities. Document Released: 12/30/2000 Document Revised: 01/25/2013 Document Reviewed: 12/12/2012 [facility] Patient Information 2014 [employer], [place]. Muscle Strain A muscle strain is an injury that occurs when a muscle is stretched beyond its normal length. Usually a small number of muscle fibers are torn when this happens. Muscle strain is rated in degrees. First-degree strains have the least amount of muscle fiber tearing and pain. Second-degree and third-degree strains have increasingly more tearing and pain.  Usually, recovery from muscle strain takes 1 2 weeks. Complete healing takes 5 6 weeks.  CAUSES  Muscle strain happens when a sudden, violent force placed on a muscle stretches it too far. This may occur with lifting, sports, or a fall.  RISK FACTORS Muscle strain is especially common in athletes.  SIGNS AND SYMPTOMS At the site of the muscle strain, there may be:  Pain.  Bruising.  Swelling.  Difficulty using the muscle due to pain or lack of normal function. DIAGNOSIS  Your health care provider will perform a physical exam and ask about your medical history. TREATMENT  Often, the best treatment for a muscle strain is resting, icing, and applying  cold compresses to the injured area.  HOME CARE INSTRUCTIONS   Use the PRICE method of treatment to promote muscle healing during the first 2 3 days after your injury. The PRICE method involves:  Protecting the  muscle from being injured again.  Restricting your activity and resting the injured body part.  Icing your injury. To do this, put ice in a plastic bag. Place a towel between your skin and the bag. Then, apply the ice and leave it on from 15 20 minutes each hour. After the third day, switch to moist heat packs.  Apply compression to the injured area with a splint or elastic bandage. Be careful not to wrap it too tightly. This may interfere with blood circulation or increase swelling.  Elevate the injured body part above the level of your heart as often as you can.  Only take over-the-counter or prescription medicines for pain, discomfort, or fever as directed by your health care provider.  Warming up prior to exercise helps to prevent future muscle strains. SEEK MEDICAL CARE IF:   You have increasing pain or swelling in the injured area.  You have numbness, tingling, or a significant loss of strength in the injured area. MAKE SURE YOU:   Understand these instructions.  Will watch your condition.  Will get help right away if you are not doing well or get worse. Document Released: 04/06/2005 Document Revised: 01/25/2013 Document Reviewed: 11/03/2012 [facility] Patient Information 2014 [place], [place].

## 2013-08-01 NOTE — ED Notes (Signed)
Spoke with [facility], states they will be in to see patient soon

## 2013-08-01 NOTE — ED Notes (Signed)
Pt in c/o right calf pain that started on Sunday after she spent a lot of Saturday on her tip toes hanging pictures, states pain has continued which prompted her to get it checked out, states she had a recent trip to the beach with approx 4 hour car ride, today developed intermittent left sided chest pain with cough, states the pain is stabbing when it comes on, denies chest pain at this time, denies shortness of breath with this, no distress noted.

## 2013-08-01 NOTE — ED Provider Notes (Signed)
CSN: [id number]     Arrival date & time 08/01/13  1758 History  This chart was scribed for non-physician practitioner, Roxy Horsemanobert Isadore Bokhari, PA-C,working with Juliet RudeNathan R. Rubin PayorPickering, MD, by Karle PlumberJennifer Tensley, ED Scribe.  This patient was seen in room TR11C/TR11C and the patient's care was started at 7:09 PM.  Chief Complaint  Patient presents with  . Chest Pain  . Leg Pain  . Cough   The history is provided by the patient. No language interpreter was used.   HPI Comments:  Julie Hendrix is a 28 y.o. female who presents to the Emergency Department complaining of right calf pain that started on Sunday. She states that she was standing on her tiptoes almost all day on Saturday hanging pictures. She states that she noticed the pain on Sunday. She tried taking ibuprofen with some relief. She denies any history of blood clots, recent surgery, or taking any exhaustion is estrogen. She states that she is also been having a runny nose, sore throat, and cough. She has not tried taking anything to alleviate her symptoms.   Past Medical History  Diagnosis Date  . Medical history non-contributory   . Abnormal Pap smear    Past Surgical History  Procedure Laterality Date  . No past surgeries    . Colposcopy     Family History  Problem Relation Age of Onset  . Hypertension Father   . Diabetes Father    History  Substance Use Topics  . Smoking status: Former Smoker -- 0.50 packs/day    Quit date: 01/01/2012  . Smokeless tobacco: Not on file  . Alcohol Use: Yes     Comment: occasionally   OB History   Grav Para Term Preterm Abortions TAB SAB Ect Mult Living   3 2 2  0 1 0 0 0 0 2     Review of Systems  Constitutional: Negative for fever and chills.  HENT: Positive for postnasal drip, rhinorrhea, sinus pressure, sneezing and sore throat.   Respiratory: Positive for cough. Negative for shortness of breath.   Gastrointestinal: Negative for nausea, vomiting, abdominal pain, diarrhea and constipation.   Genitourinary: Negative for dysuria.    Allergies  Latex  Home Medications   Prior to Admission medications   Medication Sig Start Date End Date Taking? Authorizing Provider  metroNIDAZOLE (METROGEL) 0.75 % vaginal gel Place 1 Applicatorful vaginally at bedtime. Apply one applicatorful to vagina at bedtime for 5 days 06/05/13   Tereso NewcomerUgonna A Anyanwu, MD   Triage Vitals: BP 130/71  Pulse 81  Temp(Src) 98.4 F (36.9 C) (Oral)  Resp 20  Ht 5\' 3"  (1.6 m)  Wt 240 lb (108.863 kg)  BMI 42.52 kg/m2  SpO2 95% Physical Exam  Nursing note and vitals reviewed. Constitutional: She is oriented to person, place, and time. She appears well-developed and well-nourished.  HENT:  Head: Normocephalic and atraumatic.  Eyes: Conjunctivae and EOM are normal. Pupils are equal, round, and reactive to light.  Neck: Normal range of motion. Neck supple.  Cardiovascular: Normal rate and regular rhythm.  Exam reveals no gallop and no friction rub.   No murmur heard. Pulmonary/Chest: Effort normal and breath sounds normal. No respiratory distress. She has no wheezes. She has no rales. She exhibits no tenderness.  Abdominal: Soft. She exhibits no distension and no mass. There is no tenderness. There is no rebound and no guarding.  Musculoskeletal: Normal range of motion. She exhibits no edema and no tenderness.  Right calf moderately tender to palpation over the  gastrocnemius muscle body, no unilateral leg swelling,  Neurological: She is alert and oriented to person, place, and time.  Skin: Skin is warm and dry.  Psychiatric: She has a normal mood and affect. Her behavior is normal. Judgment and thought content normal.    ED Course  Procedures (including critical care time) DIAGNOSTIC STUDIES: Oxygen Saturation is 95% on RA, normal by my interpretation.   COORDINATION OF CARE: 7:08 PM- Pt verbalizes understanding and agrees to plan.  Medications - No data to display  Labs Review Labs Reviewed  CBC -  Abnormal; Notable for the following:    WBC 10.9 (*)    Hemoglobin 11.4 (*)    HCT 35.0 (*)    MCV 74.5 (*)    MCH 24.3 (*)    RDW 18.7 (*)    All other components within normal limits  BASIC METABOLIC PANEL - Abnormal; Notable for the following:    Potassium 3.6 (*)    All other components within normal limits  TROPONIN I    Imaging Review No results found.   EKG Interpretation   Date/Time:  Tuesday August 01 2013 18:10:42 EDT Ventricular Rate:  83 PR Interval:  170 QRS Duration: 86 QT Interval:  366 QTC Calculation: 430 R Axis:   84 Text Interpretation:  Normal sinus rhythm Normal ECG     VASCULAR LAB  PRELIMINARY PRELIMINARY PRELIMINARY PRELIMINARY  Right lower extremity venous duplex completed.  Preliminary report: Right: No evidence of DVT, superficial thrombosis, or Baker's cyst.  Virginia D Slaughter, RVS  08/01/2013, 7:27 PM   MDM   Final diagnoses:  Muscle strain  Seasonal allergies   Patient with right calf pain, and recent travel. Negative Doppler DVT study. PERC negative. Patient with muscle strain. Will also treat for seasonal allergies with Zyrtec. I personally performed the services described in this documentation, which was scribed in my presence. The recorded information has been reviewed and is accurate.    Roxy Horsemanobert Ronnae Kaser, PA-C 08/01/13 1939

## 2013-08-01 NOTE — Progress Notes (Signed)
VASCULAR LAB PRELIMINARY  PRELIMINARY  PRELIMINARY  PRELIMINARY  Right lower extremity venous duplex completed.    Preliminary report:  Right:  No evidence of DVT, superficial thrombosis, or Baker's cyst.  Julie HanlonVirginia D Vadim Hendrix, RVS 08/01/2013, 7:27 PM

## 2013-08-02 NOTE — ED Provider Notes (Signed)
Medical screening examination/treatment/procedure(s) were performed by non-physician practitioner and as supervising physician I was immediately available for consultation/collaboration.   EKG Interpretation   Date/Time:  Tuesday August 01 2013 18:10:42 EDT Ventricular Rate:  83 PR Interval:  170 QRS Duration: 86 QT Interval:  366 QTC Calculation: 430 R Axis:   84 Text Interpretation:  Normal sinus rhythm Normal ECG       Azavier Creson R. Rubin PayorPickering, MD 08/02/13 [phone]

## 2013-10-24 ENCOUNTER — Emergency Department (HOSPITAL_COMMUNITY)
Admission: EM | Admit: 2013-10-24 | Discharge: 2013-10-24 | Disposition: A | Discharge status: Home / Self Care | Payer: No Typology Code available for payment source | Attending: Emergency Medicine | Admitting: Emergency Medicine

## 2013-10-24 ENCOUNTER — Emergency Department (HOSPITAL_COMMUNITY): Payer: No Typology Code available for payment source

## 2013-10-24 ENCOUNTER — Encounter (HOSPITAL_COMMUNITY): Payer: Self-pay | Admitting: Emergency Medicine

## 2013-10-24 DIAGNOSIS — S139XXA Sprain of joints and ligaments of unspecified parts of neck, initial encounter: Secondary | ICD-10-CM

## 2013-10-24 DIAGNOSIS — S298XXA Other specified injuries of thorax, initial encounter: Secondary | ICD-10-CM | POA: Insufficient documentation

## 2013-10-24 DIAGNOSIS — Y9389 Activity, other specified: Secondary | ICD-10-CM | POA: Insufficient documentation

## 2013-10-24 DIAGNOSIS — Z79899 Other long term (current) drug therapy: Secondary | ICD-10-CM | POA: Insufficient documentation

## 2013-10-24 DIAGNOSIS — S8990XA Unspecified injury of unspecified lower leg, initial encounter: Secondary | ICD-10-CM | POA: Insufficient documentation

## 2013-10-24 DIAGNOSIS — M545 Low back pain: Secondary | ICD-10-CM

## 2013-10-24 DIAGNOSIS — R0789 Other chest pain: Secondary | ICD-10-CM

## 2013-10-24 DIAGNOSIS — S0990XA Unspecified injury of head, initial encounter: Secondary | ICD-10-CM | POA: Insufficient documentation

## 2013-10-24 DIAGNOSIS — Z3202 Encounter for pregnancy test, result negative: Secondary | ICD-10-CM | POA: Insufficient documentation

## 2013-10-24 DIAGNOSIS — S99919A Unspecified injury of unspecified ankle, initial encounter: Secondary | ICD-10-CM

## 2013-10-24 DIAGNOSIS — Z91040 Latex allergy status: Secondary | ICD-10-CM | POA: Insufficient documentation

## 2013-10-24 DIAGNOSIS — S99929A Unspecified injury of unspecified foot, initial encounter: Secondary | ICD-10-CM

## 2013-10-24 DIAGNOSIS — Z87891 Personal history of nicotine dependence: Secondary | ICD-10-CM | POA: Insufficient documentation

## 2013-10-24 DIAGNOSIS — IMO0002 Reserved for concepts with insufficient information to code with codable children: Secondary | ICD-10-CM | POA: Insufficient documentation

## 2013-10-24 DIAGNOSIS — Y92410 Unspecified street and highway as the place of occurrence of the external cause: Secondary | ICD-10-CM | POA: Insufficient documentation

## 2013-10-24 LAB — POC URINE PREG, ED: Preg Test, Ur: NEGATIVE

## 2013-10-24 MED ORDER — NAPROXEN 500 MG PO TABS: 500.0000 mg | ORAL_TABLET | Freq: Two times a day (BID) | ORAL | Status: DC

## 2013-10-24 MED ORDER — DIAZEPAM 5 MG PO TABS: 5.0000 mg | ORAL_TABLET | Freq: Two times a day (BID) | ORAL | Status: DC
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
## 2013-10-24 NOTE — Progress Notes (Signed)
[facility] provided pt with a list of primary care resources to help patient establish a pcp.  °
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
## 2013-10-26 ENCOUNTER — Emergency Department (HOSPITAL_COMMUNITY)
Admission: EM | Admit: 2013-10-26 | Discharge: 2013-10-26 | Disposition: A | Discharge status: Home / Self Care | Payer: No Typology Code available for payment source | Attending: Emergency Medicine | Admitting: Emergency Medicine

## 2013-10-26 ENCOUNTER — Emergency Department (HOSPITAL_COMMUNITY): Payer: No Typology Code available for payment source

## 2013-10-26 ENCOUNTER — Encounter (HOSPITAL_COMMUNITY): Payer: Self-pay | Admitting: Emergency Medicine

## 2013-10-26 DIAGNOSIS — M62838 Other muscle spasm: Secondary | ICD-10-CM | POA: Insufficient documentation

## 2013-10-26 DIAGNOSIS — Z9119 Patient's noncompliance with other medical treatment and regimen: Secondary | ICD-10-CM | POA: Insufficient documentation

## 2013-10-26 DIAGNOSIS — S0993XA Unspecified injury of face, initial encounter: Secondary | ICD-10-CM | POA: Insufficient documentation

## 2013-10-26 DIAGNOSIS — Z79899 Other long term (current) drug therapy: Secondary | ICD-10-CM | POA: Insufficient documentation

## 2013-10-26 DIAGNOSIS — S8990XA Unspecified injury of unspecified lower leg, initial encounter: Secondary | ICD-10-CM | POA: Insufficient documentation

## 2013-10-26 DIAGNOSIS — Y92410 Unspecified street and highway as the place of occurrence of the external cause: Secondary | ICD-10-CM | POA: Insufficient documentation

## 2013-10-26 DIAGNOSIS — Z91199 Patient's noncompliance with other medical treatment and regimen due to unspecified reason: Secondary | ICD-10-CM | POA: Insufficient documentation

## 2013-10-26 DIAGNOSIS — S99929A Unspecified injury of unspecified foot, initial encounter: Principal | ICD-10-CM

## 2013-10-26 DIAGNOSIS — Y9389 Activity, other specified: Secondary | ICD-10-CM | POA: Insufficient documentation

## 2013-10-26 DIAGNOSIS — M545 Low back pain: Secondary | ICD-10-CM

## 2013-10-26 DIAGNOSIS — Z87891 Personal history of nicotine dependence: Secondary | ICD-10-CM | POA: Insufficient documentation

## 2013-10-26 DIAGNOSIS — Z791 Long term (current) use of non-steroidal anti-inflammatories (NSAID): Secondary | ICD-10-CM | POA: Insufficient documentation

## 2013-10-26 DIAGNOSIS — M542 Cervicalgia: Secondary | ICD-10-CM

## 2013-10-26 DIAGNOSIS — S199XXA Unspecified injury of neck, initial encounter: Secondary | ICD-10-CM

## 2013-10-26 DIAGNOSIS — Z91040 Latex allergy status: Secondary | ICD-10-CM | POA: Insufficient documentation

## 2013-10-26 DIAGNOSIS — S99919A Unspecified injury of unspecified ankle, initial encounter: Principal | ICD-10-CM

## 2013-10-26 DIAGNOSIS — IMO0002 Reserved for concepts with insufficient information to code with codable children: Secondary | ICD-10-CM | POA: Insufficient documentation

## 2013-10-26 MED ORDER — CYCLOBENZAPRINE HCL 5 MG PO TABS: 5.0000 mg | ORAL_TABLET | Freq: Three times a day (TID) | ORAL | Status: DC | PRN

## 2013-10-26 MED ORDER — DEXAMETHASONE SODIUM PHOSPHATE 10 MG/ML IJ SOLN
10.0000 mg | Freq: Once | INTRAMUSCULAR | Status: AC
  Administered 2013-10-26: 10 mg via INTRAMUSCULAR
  Filled 2013-10-26: qty 1

## 2013-10-26 MED ORDER — KETOROLAC TROMETHAMINE 30 MG/ML IJ SOLN
30.0000 mg | Freq: Once | INTRAMUSCULAR | Status: AC
  Administered 2013-10-26: 30 mg via INTRAMUSCULAR
  Filled 2013-10-26: qty 1
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
## 2013-10-27 NOTE — ED Provider Notes (Signed)
Medical screening examination/treatment/procedure(s) were performed by non-physician practitioner and as supervising physician I was immediately available for consultation/collaboration.   EKG Interpretation None        William Karrisa Didio, MD 10/27/13 0001 

## 2013-10-30 NOTE — ED Provider Notes (Signed)
Medical screening examination/treatment/procedure(s) were performed by non-physician practitioner and as supervising physician I was immediately available for consultation/collaboration.   EKG Interpretation None        Akela Pocius M Fallou Hulbert, MD 10/30/13 1530 

## 2014-01-09 ENCOUNTER — Encounter (HOSPITAL_COMMUNITY): Payer: Self-pay | Admitting: Emergency Medicine

## 2014-01-09 ENCOUNTER — Emergency Department (HOSPITAL_COMMUNITY)
Admission: EM | Admit: 2014-01-09 | Discharge: 2014-01-09 | Disposition: A | Discharge status: Home / Self Care | Payer: No Typology Code available for payment source | Attending: Emergency Medicine | Admitting: Emergency Medicine

## 2014-01-09 DIAGNOSIS — G5600 Carpal tunnel syndrome, unspecified upper limb: Secondary | ICD-10-CM | POA: Insufficient documentation

## 2014-01-09 DIAGNOSIS — Z91040 Latex allergy status: Secondary | ICD-10-CM | POA: Insufficient documentation

## 2014-01-09 DIAGNOSIS — G5603 Carpal tunnel syndrome, bilateral upper limbs: Secondary | ICD-10-CM

## 2014-01-09 DIAGNOSIS — Z791 Long term (current) use of non-steroidal anti-inflammatories (NSAID): Secondary | ICD-10-CM | POA: Insufficient documentation

## 2014-01-09 DIAGNOSIS — M79609 Pain in unspecified limb: Secondary | ICD-10-CM | POA: Insufficient documentation

## 2014-01-09 DIAGNOSIS — Z87891 Personal history of nicotine dependence: Secondary | ICD-10-CM | POA: Insufficient documentation

## 2014-01-09 DIAGNOSIS — Z79899 Other long term (current) drug therapy: Secondary | ICD-10-CM | POA: Insufficient documentation

## 2014-01-09 MED ORDER — NAPROXEN 500 MG PO TABS: 500.0000 mg | ORAL_TABLET | Freq: Two times a day (BID) | ORAL | Status: DC

## 2014-01-09 MED ORDER — TRAMADOL HCL 50 MG PO TABS: 50.0000 mg | ORAL_TABLET | Freq: Four times a day (QID) | ORAL | Status: DC | PRN

## 2014-01-09 NOTE — Discharge Instructions (Signed)
Take Naprosyn as needed for inflammation. Take Tramadol as needed for extra pain relief. Wear bilateral splints at night. Refer to attached documents for more information.

## 2014-01-09 NOTE — ED Provider Notes (Signed)
CSN: [id number]     Arrival date & time 01/09/14  1455 History   First MD Initiated Contact with Patient 01/09/14 1535     Chief Complaint  Patient presents with  . Hand Pain     (Consider location/radiation/quality/duration/timing/severity/associated sxs/prior Treatment) HPI Comments: Patient is a 28 year old female who presents with bilateral wrist pain for the past 2 weeks. Symptoms started gradually and progressively worsened since the onset. The pain is worse in the morning and is aching without radiation. No known injury. Patient cleans hotel rooms for work. She has not tried anything for pain. She reports associated hand tingling in the morning. No other associated symptoms.    Past Medical History  Diagnosis Date  . Medical history non-contributory   . Abnormal Pap smear    Past Surgical History  Procedure Laterality Date  . No past surgeries    . Colposcopy     Family History  Problem Relation Age of Onset  . Hypertension Father   . Diabetes Father    History  Substance Use Topics  . Smoking status: Former Smoker -- 0.50 packs/day    Quit date: 01/01/2012  . Smokeless tobacco: Not on file  . Alcohol Use: Yes     Comment: occasionally   OB History   Grav Para Term Preterm Abortions TAB SAB Ect Mult Living   0 1 0 0 0 0 2     Review of Systems  Constitutional: Negative for fever, chills and fatigue.  HENT: Negative for trouble swallowing.   Eyes: Negative for visual disturbance.  Respiratory: Negative for shortness of breath.   Cardiovascular: Negative for chest pain and palpitations.  Gastrointestinal: Negative for nausea, vomiting, abdominal pain and diarrhea.  Genitourinary: Negative for dysuria and difficulty urinating.  Musculoskeletal: Positive for arthralgias. Negative for neck pain.  Skin: Negative for color change.  Neurological: Negative for dizziness and weakness.  Psychiatric/Behavioral: Negative for dysphoric mood.      Allergies   Latex  Home Medications   Prior to Admission medications   Medication Sig Start Date End Date Taking? Authorizing Provider  cyclobenzaprine (FLEXERIL) 5 MG tablet Take 1 tablet (5 mg total) by mouth 3 (three) times daily as needed for muscle spasms. 10/26/13   Jillyn Ledger, PA-C  diazepam (VALIUM) 5 MG tablet Take 1 tablet (5 mg total) by mouth 2 (two) times daily. 10/24/13   Mora Bellman, PA-C  naproxen (NAPROSYN) 500 MG tablet Take 1 tablet (500 mg total) by mouth 2 (two) times daily with a meal. 10/24/13   Mora Bellman, PA-C  Prenatal Vit-Fe Fumarate-FA (MULTIVITAMIN-PRENATAL) 27-0.8 MG TABS tablet Take 1 tablet by mouth daily at 12 noon.    Historical Provider, MD   BP 149/82  Pulse 77  Temp(Src) 97.9 F (36.6 C) (Oral)  Resp 18  Ht  (1.575 m)  Wt 253 lb (114.76 kg)  BMI 46.26 kg/m2  SpO2 97%  LMP 12/02/2013 Physical Exam  Nursing note and vitals reviewed. Constitutional: She is oriented to person, place, and time. She appears well-developed and well-nourished. No distress.  HENT:  Head: Normocephalic and atraumatic.  Eyes: Conjunctivae and EOM are normal.  Neck: Normal range of motion.  Cardiovascular: Normal rate and regular rhythm.  Exam reveals no gallop and no friction rub.   No murmur heard. Pulmonary/Chest: Effort normal and breath sounds normal. She has no wheezes. She has no rales. She exhibits no tenderness.  Musculoskeletal: Normal range of motion.  Positive Tinnel's sign and Phalen sign bilaterally. No wrist tenderness to palpation or obvious deformity. Full ROM of bilateral fingers.   Neurological: She is alert and oriented to person, place, and time. Coordination normal.  Speech is goal-oriented. Moves limbs without ataxia.   Skin: Skin is warm and dry.  Psychiatric: She has a normal mood and affect. Her behavior is normal.    ED Course  Procedures (including critical care time) Labs Review Labs Reviewed - No data to display  Imaging  Review No results found.  SPLINT APPLICATION Date/Time: 3:56 PM Authorized by: Emilia Beck Consent: Verbal consent obtained. Risks and benefits: risks, benefits and alternatives were discussed Consent given by: patient Splint applied by: orthopedic technician Location details: right hand Splint type: thumb spica velcro Post-procedure: The splinted body part was neurovascularly unchanged following the procedure. Patient tolerance: Patient tolerated the procedure well with no immediate complications.   SPLINT APPLICATION Date/Time: 3:56 PM Authorized by: Emilia Beck Consent: Verbal consent obtained. Risks and benefits: risks, benefits and alternatives were discussed Consent given by: patient Splint applied by: orthopedic technician Location details: left hand Splint type: thumb spica velcro Post-procedure: The splinted body part was neurovascularly unchanged following the procedure. Patient tolerance: Patient tolerated the procedure well with no immediate complications.      EKG Interpretation None      MDM   Final diagnoses:  Bilateral carpal tunnel syndrome   3:51 PM Patient likely has bilateral carpal tunnel. Patient denies injury. I will prescribe bilateral thumb spica splints to wear at night. Patient will be discharged with anti inflammatory medication and additional pain medication. No neurovascular compromise. Vitals stable and patient afebrile.     Emilia Beck, PA-C 01/11/14 1357

## 2014-01-09 NOTE — ED Notes (Signed)
C/o bilateral arm pain and some back pain for x 2 weeks. Worse when waking up.

## 2014-01-11 NOTE — ED Provider Notes (Signed)
Medical screening examination/treatment/procedure(s) were performed by non-physician practitioner and as supervising physician I was immediately available for consultation/collaboration.     Geoffery Lyons, MD 01/11/14 [phone]

## 2014-02-19 ENCOUNTER — Encounter (HOSPITAL_COMMUNITY): Payer: Self-pay | Admitting: Emergency Medicine

## 2014-04-22 ENCOUNTER — Emergency Department (HOSPITAL_COMMUNITY)
Admission: EM | Admit: 2014-04-22 | Discharge: 2014-04-22 | Disposition: A | Discharge status: Home / Self Care | Payer: Medicaid Other | Attending: Emergency Medicine | Admitting: Emergency Medicine

## 2014-04-22 ENCOUNTER — Emergency Department (HOSPITAL_COMMUNITY): Payer: No Typology Code available for payment source

## 2014-04-22 ENCOUNTER — Encounter (HOSPITAL_COMMUNITY): Payer: Self-pay | Admitting: *Deleted

## 2014-04-22 DIAGNOSIS — Y9289 Other specified places as the place of occurrence of the external cause: Secondary | ICD-10-CM | POA: Insufficient documentation

## 2014-04-22 DIAGNOSIS — S0512XA Contusion of eyeball and orbital tissues, left eye, initial encounter: Secondary | ICD-10-CM

## 2014-04-22 DIAGNOSIS — Z791 Long term (current) use of non-steroidal anti-inflammatories (NSAID): Secondary | ICD-10-CM | POA: Insufficient documentation

## 2014-04-22 DIAGNOSIS — Z79899 Other long term (current) drug therapy: Secondary | ICD-10-CM | POA: Insufficient documentation

## 2014-04-22 DIAGNOSIS — Y998 Other external cause status: Secondary | ICD-10-CM | POA: Insufficient documentation

## 2014-04-22 DIAGNOSIS — R6884 Jaw pain: Secondary | ICD-10-CM | POA: Insufficient documentation

## 2014-04-22 DIAGNOSIS — Z87891 Personal history of nicotine dependence: Secondary | ICD-10-CM | POA: Insufficient documentation

## 2014-04-22 DIAGNOSIS — S0012XA Contusion of left eyelid and periocular area, initial encounter: Secondary | ICD-10-CM | POA: Insufficient documentation

## 2014-04-22 DIAGNOSIS — Z91040 Latex allergy status: Secondary | ICD-10-CM | POA: Insufficient documentation

## 2014-04-22 DIAGNOSIS — Y9389 Activity, other specified: Secondary | ICD-10-CM | POA: Insufficient documentation

## 2014-04-22 MED ORDER — FLUORESCEIN SODIUM 1 MG OP STRP
1.0000 | ORAL_STRIP | Freq: Once | OPHTHALMIC | Status: AC
  Administered 2014-04-22: 1 via OPHTHALMIC
  Filled 2014-04-22: qty 1

## 2014-04-22 MED ORDER — TETRACAINE HCL 0.5 % OP SOLN
2.0000 [drp] | Freq: Once | OPHTHALMIC | Status: AC
  Administered 2014-04-22: 2 [drp] via OPHTHALMIC
  Filled 2014-04-22: qty 2
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
## 2014-04-22 NOTE — ED Notes (Signed)
Declined W/C at D/C and was escorted to lobby by RN. 

## 2014-04-22 NOTE — Discharge Instructions (Signed)
Return here as needed. Your scans were normal. Use ice and heat on your eye to help reduce swelling.
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
## 2014-04-25 ENCOUNTER — Encounter (HOSPITAL_COMMUNITY): Payer: Self-pay | Admitting: Emergency Medicine

## 2014-04-25 ENCOUNTER — Emergency Department (HOSPITAL_COMMUNITY)
Admission: EM | Admit: 2014-04-25 | Discharge: 2014-04-25 | Disposition: A | Discharge status: Home / Self Care | Payer: No Typology Code available for payment source | Attending: Emergency Medicine | Admitting: Emergency Medicine

## 2014-04-25 DIAGNOSIS — Y9289 Other specified places as the place of occurrence of the external cause: Secondary | ICD-10-CM | POA: Insufficient documentation

## 2014-04-25 DIAGNOSIS — S0512XA Contusion of eyeball and orbital tissues, left eye, initial encounter: Secondary | ICD-10-CM

## 2014-04-25 DIAGNOSIS — F0781 Postconcussional syndrome: Secondary | ICD-10-CM | POA: Insufficient documentation

## 2014-04-25 DIAGNOSIS — H1132 Conjunctival hemorrhage, left eye: Secondary | ICD-10-CM

## 2014-04-25 DIAGNOSIS — W228XXA Striking against or struck by other objects, initial encounter: Secondary | ICD-10-CM | POA: Insufficient documentation

## 2014-04-25 DIAGNOSIS — Z87891 Personal history of nicotine dependence: Secondary | ICD-10-CM | POA: Insufficient documentation

## 2014-04-25 DIAGNOSIS — Z791 Long term (current) use of non-steroidal anti-inflammatories (NSAID): Secondary | ICD-10-CM | POA: Insufficient documentation

## 2014-04-25 DIAGNOSIS — S0012XA Contusion of left eyelid and periocular area, initial encounter: Secondary | ICD-10-CM | POA: Insufficient documentation

## 2014-04-25 DIAGNOSIS — Z79899 Other long term (current) drug therapy: Secondary | ICD-10-CM | POA: Insufficient documentation

## 2014-04-25 DIAGNOSIS — Y9389 Activity, other specified: Secondary | ICD-10-CM | POA: Insufficient documentation

## 2014-04-25 DIAGNOSIS — Z91040 Latex allergy status: Secondary | ICD-10-CM | POA: Insufficient documentation

## 2014-04-25 DIAGNOSIS — Y998 Other external cause status: Secondary | ICD-10-CM | POA: Insufficient documentation

## 2014-04-25 MED ORDER — KETOROLAC TROMETHAMINE 10 MG PO TABS: 10.0000 mg | ORAL_TABLET | Freq: Four times a day (QID) | ORAL | Status: DC | PRN
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
## 2014-04-25 NOTE — Discharge Instructions (Signed)
1. Medications: Toradol, usual home medications 2. Treatment: rest, drink plenty of fluids, ice 3. Follow Up: Please followup with ophthalmology as needed if your symptoms worsen or persist further evaluation after today's visit; if you do not have a primary care doctor use the resource guide provided to find one; Please return to the ER for concerning symptoms, headache associated with syncope or vision changes    Post-Concussion Syndrome Post-concussion syndrome describes the symptoms that can occur after a head injury. These symptoms can last from weeks to months. CAUSES  It is not clear why some head injuries cause post-concussion syndrome. It can occur whether your head injury was mild or severe and whether you were wearing head protection or not.  SIGNS AND SYMPTOMS  Memory difficulties.  Dizziness.  Headaches.  Double vision or blurry vision.  Sensitivity to light.  Hearing difficulties.  Depression.  Tiredness.  Weakness.  Difficulty with concentration.  Difficulty sleeping or staying asleep.  Vomiting.  Poor balance or instability on your feet.  Slow reaction time.  Difficulty learning and remembering things you have heard. DIAGNOSIS  There is no test to determine whether you have post-concussion syndrome. Your health care provider may order an imaging scan of your brain, such as a CT scan, to check for other problems that may be causing your symptoms (such as severe injury inside your skull). TREATMENT  Usually, these problems disappear over time without medical care. Your health care provider may prescribe medicine to help ease your symptoms. It is important to follow up with a neurologist to evaluate your recovery and address any lingering symptoms or issues. HOME CARE INSTRUCTIONS   Only take over-the-counter or prescription medicines for pain, discomfort, or fever as directed by your health care provider. Do not take aspirin. Aspirin can slow blood  clotting.  Sleep with your head slightly elevated to help with headaches.  Avoid any situation where there is potential for another head injury (football, hockey, soccer, basketball, martial arts, downhill snow sports, and horseback riding). Your condition will get worse every time you experience a concussion. You should avoid these activities until you are evaluated by the appropriate follow-up health care providers.  Keep all follow-up appointments as directed by your health care provider. SEEK IMMEDIATE MEDICAL CARE IF:  You develop confusion or unusual drowsiness.  You cannot wake the injured person.  You develop nausea or persistent, forceful vomiting.  You feel like you are moving when you are not (vertigo).  You notice the injured person's eyes moving rapidly back and forth. This may be a sign of vertigo.  You have convulsions or faint.  You have severe, persistent headaches that are not relieved by medicine.  You cannot use your arms or legs normally.  Your pupils change size.  You have clear or bloody discharge from the nose or ears.  Your problems are getting worse, not better. MAKE SURE YOU:  Understand these instructions.  Will watch your condition.  Will get help right away if you are not doing well or get worse. Document Released: 09/26/2001 Document Revised: 01/25/2013 Document Reviewed: 07/12/2013 [facility] Patient Information 2015 [place], [place]. This information is not intended to replace advice given to you by your health care provider. Make sure you discuss any questions you have with your health care provider.

## 2014-07-15 ENCOUNTER — Encounter (HOSPITAL_COMMUNITY): Payer: Self-pay | Admitting: Family Medicine

## 2014-07-15 ENCOUNTER — Emergency Department (HOSPITAL_COMMUNITY): Payer: Self-pay

## 2014-07-15 ENCOUNTER — Emergency Department (HOSPITAL_COMMUNITY)
Admission: EM | Admit: 2014-07-15 | Discharge: 2014-07-15 | Disposition: A | Discharge status: Home / Self Care | Payer: Medicaid Other | Attending: Emergency Medicine | Admitting: Emergency Medicine

## 2014-07-15 ENCOUNTER — Emergency Department (HOSPITAL_COMMUNITY): Payer: Medicaid Other

## 2014-07-15 DIAGNOSIS — Z791 Long term (current) use of non-steroidal anti-inflammatories (NSAID): Secondary | ICD-10-CM | POA: Insufficient documentation

## 2014-07-15 DIAGNOSIS — R102 Pelvic and perineal pain: Secondary | ICD-10-CM

## 2014-07-15 DIAGNOSIS — N760 Acute vaginitis: Secondary | ICD-10-CM | POA: Insufficient documentation

## 2014-07-15 DIAGNOSIS — Z3202 Encounter for pregnancy test, result negative: Secondary | ICD-10-CM | POA: Insufficient documentation

## 2014-07-15 DIAGNOSIS — Z91040 Latex allergy status: Secondary | ICD-10-CM | POA: Insufficient documentation

## 2014-07-15 DIAGNOSIS — B9689 Other specified bacterial agents as the cause of diseases classified elsewhere: Secondary | ICD-10-CM

## 2014-07-15 DIAGNOSIS — Z87891 Personal history of nicotine dependence: Secondary | ICD-10-CM | POA: Insufficient documentation

## 2014-07-15 DIAGNOSIS — Z79899 Other long term (current) drug therapy: Secondary | ICD-10-CM | POA: Insufficient documentation

## 2014-07-15 DIAGNOSIS — R52 Pain, unspecified: Secondary | ICD-10-CM

## 2014-07-15 LAB — WET PREP, GENITAL
Trich, Wet Prep: NONE SEEN
Yeast Wet Prep HPF POC: NONE SEEN

## 2014-07-15 LAB — URINALYSIS, ROUTINE W REFLEX MICROSCOPIC
Bilirubin Urine: NEGATIVE
Glucose, UA: NEGATIVE mg/dL
Hgb urine dipstick: NEGATIVE
KETONES UR: NEGATIVE mg/dL
NITRITE: NEGATIVE
Protein, ur: NEGATIVE mg/dL
Specific Gravity, Urine: 1.02 (ref 1.005–1.030)
UROBILINOGEN UA: 0.2 mg/dL (ref 0.0–1.0)
pH: 5 (ref 5.0–8.0)

## 2014-07-15 LAB — POC URINE PREG, ED: Preg Test, Ur: NEGATIVE

## 2014-07-15 LAB — URINE MICROSCOPIC-ADD ON

## 2014-07-15 MED ORDER — IBUPROFEN 800 MG PO TABS: 800.0000 mg | ORAL_TABLET | Freq: Three times a day (TID) | ORAL | Status: DC

## 2014-07-15 MED ORDER — METRONIDAZOLE 500 MG PO TABS: 500.0000 mg | ORAL_TABLET | Freq: Two times a day (BID) | ORAL | Status: DC

## 2014-07-15 NOTE — ED Provider Notes (Signed)
CSN: [id number]     Arrival date & time 07/15/14  1349 History   First MD Initiated Contact with Patient 07/15/14 1435     Chief Complaint  Patient presents with  . Abdominal Pain  . Vaginal Discharge     (Consider location/radiation/quality/duration/timing/severity/associated sxs/prior Treatment) Patient is a 29 y.o. female presenting with vaginal discharge. The history is provided by the patient. No language interpreter was used.  Vaginal Discharge Quality:  White Severity:  Moderate Onset quality:  Gradual Duration:  2 days Timing:  Constant Progression:  Worsening Chronicity:  New Context: not after urination and not at rest   Relieved by:  Nothing Worsened by:  Nothing tried Associated symptoms: dysuria   Associated symptoms: no vaginal itching   Risk factors: no PID and no prior miscarriage     Past Medical History  Diagnosis Date  . Medical history non-contributory   . Abnormal Pap smear    Past Surgical History  Procedure Laterality Date  . No past surgeries    . Colposcopy     Family History  Problem Relation Age of Onset  . Hypertension Father   . Diabetes Father    History  Substance Use Topics  . Smoking status: Former Smoker -- 0.50 packs/day    Quit date: 01/01/2012  . Smokeless tobacco: Not on file  . Alcohol Use: Yes     Comment: occasionally   OB History    Gravida Para Term Preterm AB TAB SAB Ectopic Multiple Living   0 1 0 0 0 0 2     Review of Systems  Genitourinary: Positive for dysuria and vaginal discharge.  All other systems reviewed and are negative.     Allergies  Latex  Home Medications   Prior to Admission medications   Medication Sig Start Date End Date Taking? Authorizing Provider  Aspirin-Salicylamide-Caffeine (BC HEADACHE POWDER PO) Take 1 packet by mouth daily as needed (toothache).   Yes Historical Provider, MD  cyclobenzaprine (FLEXERIL) 5 MG tablet Take 1 tablet (5 mg total) by mouth 3 (three) times daily  as needed for muscle spasms. Patient not taking: Reported on 07/15/2014 10/26/13   Jillyn Ledger, PA-C  diazepam (VALIUM) 5 MG tablet Take 1 tablet (5 mg total) by mouth 2 (two) times daily. Patient not taking: Reported on 07/15/2014 10/24/13   Junious Silk, PA-C  ketorolac (TORADOL) 10 MG tablet Take 1 tablet (10 mg total) by mouth every 6 (six) hours as needed. Patient not taking: Reported on 07/15/2014 04/25/14   Dahlia Client Muthersbaugh, PA-C  naproxen (NAPROSYN) 500 MG tablet Take 1 tablet (500 mg total) by mouth 2 (two) times daily with a meal. Patient not taking: Reported on 07/15/2014 10/24/13   Junious Silk, PA-C  naproxen (NAPROSYN) 500 MG tablet Take 1 tablet (500 mg total) by mouth 2 (two) times daily with a meal. Patient not taking: Reported on 07/15/2014 01/09/14   Emilia Beck, PA-C  traMADol (ULTRAM) 50 MG tablet Take 1 tablet (50 mg total) by mouth every 6 (six) hours as needed. Patient not taking: Reported on 07/15/2014 01/09/14   Emilia Beck, PA-C   BP 115/68 mmHg  Pulse 84  Temp(Src) 97.9 F (36.6 C) (Oral)  Resp 20  SpO2 96%  LMP 06/03/2014 Physical Exam  Constitutional: She is oriented to person, place, and time. She appears well-developed and well-nourished.  HENT:  Head: Normocephalic.  Right Ear: External ear normal.  Left Ear: External ear normal.  Mouth/Throat: Oropharynx is clear  and moist.  Eyes: Pupils are equal, round, and reactive to light.  Neck: Normal range of motion.  Cardiovascular: Normal rate and normal heart sounds.   Pulmonary/Chest: Effort normal and breath sounds normal.  Abdominal: Soft.  Genitourinary: Vaginal discharge found.  Thick white vaginal discharge adnexa no mass,    Musculoskeletal: Normal range of motion.  Neurological: She is alert and oriented to person, place, and time. She has normal reflexes.  Skin: Skin is warm.  Nursing note and vitals reviewed.   ED Course  Procedures (including critical care time) Labs  Review Labs Reviewed  WET PREP, GENITAL - Abnormal; Notable for the following:    Clue Cells Wet Prep HPF POC MODERATE (*)    WBC, Wet Prep HPF POC FEW (*)    All other components within normal limits  URINALYSIS, ROUTINE W REFLEX MICROSCOPIC - Abnormal; Notable for the following:    Color, Urine AMBER (*)    Leukocytes, UA TRACE (*)    All other components within normal limits  URINE MICROSCOPIC-ADD ON  PREGNANCY, URINE  POC URINE PREG, ED  GC/CHLAMYDIA PROBE AMP (H. [facility] Colon)    Imaging Review No results found.   EKG Interpretation None      MDM wet prep shows clue cells, Ultraouns reviewed normal.    Final diagnoses:  Pain  Bacterial vaginitis  Pelvic pain in female    Flagyl Ibuprofen    Elson AreasLeslie K Gilmer Kaminsky, PA-C 07/15/14 2059  Tilden FossaElizabeth Rees, MD 07/15/14 2136

## 2014-07-15 NOTE — ED Notes (Signed)
Pt sts sharp mid and lower abd pain since yesterday. sts some vaginal discharge. Denies N,V,D. sts LBM this am.

## 2014-07-15 NOTE — ED Notes (Signed)
Spoke with Lab about patient wet prep, states just received and will result shortly.

## 2014-07-15 NOTE — Discharge Instructions (Signed)
Abdominal Pain °Many things can cause abdominal pain. Usually, abdominal pain is not caused by a disease and will improve without treatment. It can often be observed and treated at home. Your health care provider will do a physical exam and possibly order blood tests and X-rays to help determine the seriousness of your pain. However, in many cases, more time must pass before a clear cause of the pain can be found. Before that point, your health care provider may not know if you need more testing or further treatment. °HOME CARE INSTRUCTIONS  °Monitor your abdominal pain for any changes. The following actions may help to alleviate any discomfort you are experiencing: °· Only take over-the-counter or prescription medicines as directed by your health care provider. °· Do not take laxatives unless directed to do so by your health care provider. °· Try a clear liquid diet (broth, tea, or water) as directed by your health care provider. Slowly move to a bland diet as tolerated. °SEEK MEDICAL CARE IF: °· You have unexplained abdominal pain. °· You have abdominal pain associated with nausea or diarrhea. °· You have pain when you urinate or have a bowel movement. °· You experience abdominal pain that wakes you in the night. °· You have abdominal pain that is worsened or improved by eating food. °· You have abdominal pain that is worsened with eating fatty foods. °· You have a fever. °SEEK IMMEDIATE MEDICAL CARE IF:  °· Your pain does not go away within 2 hours. °· You keep throwing up (vomiting). °· Your pain is felt only in portions of the abdomen, such as the right side or the left lower portion of the abdomen. °· You pass bloody or black tarry stools. °MAKE SURE YOU: °· Understand these instructions.   °· Will watch your condition.   °· Will get help right away if you are not doing well or get worse.   °Document Released: 01/14/2005 Document Revised: 04/11/2013 Document Reviewed: 12/14/2012 °ExitCare® Patient Information  ©2015 [employer], LLC. This information is not intended to replace advice given to you by your health care provider. Make sure you discuss any questions you have with your health care provider. °Bacterial Vaginosis °Bacterial vaginosis is a vaginal infection that occurs when the normal balance of bacteria in the vagina is disrupted. It results from an overgrowth of certain bacteria. This is the most common vaginal infection in women of childbearing age. Treatment is important to prevent complications, especially in pregnant women, as it can cause a premature delivery. °CAUSES  °Bacterial vaginosis is caused by an increase in harmful bacteria that are normally present in smaller amounts in the vagina. Several different kinds of bacteria can cause bacterial vaginosis. However, the reason that the condition develops is not fully understood. °RISK FACTORS °Certain activities or behaviors can put you at an increased risk of developing bacterial vaginosis, including: °· Having a new sex partner or multiple sex partners. °· Douching. °· Using an intrauterine device (IUD) for contraception. °Women do not get bacterial vaginosis from toilet seats, bedding, swimming pools, or contact with objects around them. °SIGNS AND SYMPTOMS  °Some women with bacterial vaginosis have no signs or symptoms. Common symptoms include: °· Grey vaginal discharge. °· A fishlike odor with discharge, especially after sexual intercourse. °· Itching or burning of the vagina and vulva. °· Burning or pain with urination. °DIAGNOSIS  °Your health care provider will take a medical history and examine the vagina for signs of bacterial vaginosis. A sample of vaginal fluid may be taken.   Your health care provider will look at this sample under a microscope to check for bacteria and abnormal cells. A vaginal pH test may also be done.  °TREATMENT  °Bacterial vaginosis may be treated with antibiotic medicines. These may be given in the form of a pill or a vaginal  cream. A second round of antibiotics may be prescribed if the condition comes back after treatment.  °HOME CARE INSTRUCTIONS  °· Only take over-the-counter or prescription medicines as directed by your health care provider. °· If antibiotic medicine was prescribed, take it as directed. Make sure you finish it even if you start to feel better. °· Do not have sex until treatment is completed. °· Tell all sexual partners that you have a vaginal infection. They should see their health care provider and be treated if they have problems, such as a mild rash or itching. °· Practice safe sex by using condoms and only having one sex partner. °SEEK MEDICAL CARE IF:  °· Your symptoms are not improving after 3 days of treatment. °· You have increased discharge or pain. °· You have a fever. °MAKE SURE YOU:  °· Understand these instructions. °· Will watch your condition. °· Will get help right away if you are not doing well or get worse. °FOR MORE INFORMATION  °Centers for Disease Control and Prevention, Division of STD Prevention: www.cdc.gov/std °American Sexual Health Association (ASHA): www.ashastd.org  °Document Released: 04/06/2005 Document Revised: 01/25/2013 Document Reviewed: 11/16/2012 °ExitCare® Patient Information ©2015 Exit[employer], LLC. This information is not intended to replace advice given to you by your health care provider. Make sure you discuss any questions you have with your health care provider. ° °

## 2014-07-16 LAB — GC/CHLAMYDIA PROBE AMP (~~LOC~~) NOT AT ARMC
Chlamydia: NEGATIVE
Neisseria Gonorrhea: NEGATIVE

## 2014-07-31 ENCOUNTER — Encounter (HOSPITAL_COMMUNITY): Payer: Self-pay | Admitting: Emergency Medicine

## 2014-07-31 ENCOUNTER — Emergency Department (HOSPITAL_COMMUNITY)
Admission: EM | Admit: 2014-07-31 | Discharge: 2014-07-31 | Disposition: A | Discharge status: Home / Self Care | Payer: Medicaid Other | Attending: Emergency Medicine | Admitting: Emergency Medicine

## 2014-07-31 DIAGNOSIS — Z720 Tobacco use: Secondary | ICD-10-CM | POA: Insufficient documentation

## 2014-07-31 DIAGNOSIS — K029 Dental caries, unspecified: Secondary | ICD-10-CM | POA: Insufficient documentation

## 2014-07-31 DIAGNOSIS — H9209 Otalgia, unspecified ear: Secondary | ICD-10-CM | POA: Insufficient documentation

## 2014-07-31 DIAGNOSIS — Z791 Long term (current) use of non-steroidal anti-inflammatories (NSAID): Secondary | ICD-10-CM | POA: Insufficient documentation

## 2014-07-31 DIAGNOSIS — M542 Cervicalgia: Secondary | ICD-10-CM | POA: Insufficient documentation

## 2014-07-31 DIAGNOSIS — Z91040 Latex allergy status: Secondary | ICD-10-CM | POA: Insufficient documentation

## 2014-07-31 DIAGNOSIS — Z792 Long term (current) use of antibiotics: Secondary | ICD-10-CM | POA: Insufficient documentation

## 2014-07-31 DIAGNOSIS — Z7982 Long term (current) use of aspirin: Secondary | ICD-10-CM | POA: Insufficient documentation

## 2014-07-31 MED ORDER — AMOXICILLIN 500 MG PO CAPS: 500.0000 mg | ORAL_CAPSULE | Freq: Three times a day (TID) | ORAL | Status: DC

## 2014-07-31 MED ORDER — TRAMADOL HCL 50 MG PO TABS: 50.0000 mg | ORAL_TABLET | Freq: Four times a day (QID) | ORAL | Status: DC | PRN

## 2014-07-31 NOTE — ED Provider Notes (Signed)
CSN: [id number]     Arrival date & time 07/31/14  1837 History  This chart was scribed for non-physician practitioner, Rubye OaksErin O'Mallery, PA-C,working with Blake DivineJohn Wofford, MD, by Karle PlumberJennifer Tensley, ED Scribe. This patient was seen in room WTR7/WTR7 and the patient's care was started at 7:32 PM.  Chief Complaint  Patient presents with  . Dental Pain    lower right mouth  . Otalgia  . Neck Pain    pain and swelling on r/side of mouth   The history is provided by the patient and medical records. No language interpreter was used.    HPI Comments:  Julie Hendrix is a 29 y.o. obese female who presents to the Emergency Department complaining of severe right lower dental pain that began yesterday. She states she currently has a fractured tooth. She reports associated pain from her tooth that radiates down her right neck. She states she has taken Ibuprofen approximately 8 hours ago with minimal relief of the pain. Eating makes the pain worse. Denies alleviating factors. Pt was sleeping and had to be aroused upon entrance to the exam room. Denies fever, chills, nausea or vomiting.  Past Medical History  Diagnosis Date  . Medical history non-contributory   . Abnormal Pap smear    Past Surgical History  Procedure Laterality Date  . No past surgeries    . Colposcopy     Family History  Problem Relation Age of Onset  . Hypertension Father   . Diabetes Father    History  Substance Use Topics  . Smoking status: Current Every Day Smoker -- 1.00 packs/day  . Smokeless tobacco: Not on file  . Alcohol Use: Yes     Comment: occasionally   OB History    Gravida Para Term Preterm AB TAB SAB Ectopic Multiple Living   3 2 2  0 1 0 0 0 0 2     Review of Systems  Constitutional: Negative for fever and chills.  HENT: Positive for dental problem.   Gastrointestinal: Negative for nausea and vomiting.  Musculoskeletal: Positive for neck pain.    Allergies  Latex  Home Medications   Prior to Admission  medications   Medication Sig Start Date End Date Taking? Authorizing Provider  amoxicillin (AMOXIL) 500 MG capsule Take 1 capsule (500 mg total) by mouth 3 (three) times daily. 07/31/14   Junius FinnerErin O'Malley, PA-C  Aspirin-Salicylamide-Caffeine (BC HEADACHE POWDER PO) Take 1 packet by mouth daily as needed (toothache).    Historical Provider, MD  ibuprofen (ADVIL,MOTRIN) 800 MG tablet Take 1 tablet (800 mg total) by mouth 3 (three) times daily. 07/15/14   Elson AreasLeslie K Sofia, PA-C  metroNIDAZOLE (FLAGYL) 500 MG tablet Take 1 tablet (500 mg total) by mouth 2 (two) times daily. 07/15/14   Elson AreasLeslie K Sofia, PA-C  traMADol (ULTRAM) 50 MG tablet Take 1 tablet (50 mg total) by mouth every 6 (six) hours as needed. 07/31/14   Junius FinnerErin O'Malley, PA-C   Triage Vitals: BP 145/89 mmHg  Pulse 66  Resp 18  Ht 5\' 3"  (1.6 m)  Wt 255 lb (115.667 kg)  BMI 45.18 kg/m2  SpO2 95%  LMP 06/03/2014 Physical Exam  Constitutional: She is oriented to person, place, and time. She appears well-developed and well-nourished.  Pt sleeping upon entrance to exam room.  HENT:  Head: Normocephalic and atraumatic.  Mouth/Throat: Uvula is midline, oropharynx is clear and moist and mucous membranes are normal. No trismus in the jaw. Abnormal dentition. Dental caries present. No dental abscesses.  Right  lower jaw with multiple dental carries and dental decay last two molars. No gingival abscess. No bleeding or discharge noted.  Eyes: EOM are normal.  Neck: Normal range of motion.  Cardiovascular: Normal rate.   Pulmonary/Chest: Effort normal.  Musculoskeletal: Normal range of motion.  Neurological: She is alert and oriented to person, place, and time.  Skin: Skin is warm and dry.  Psychiatric: She has a normal mood and affect. Her behavior is normal.  Nursing note and vitals reviewed.   ED Course  Procedures (including critical care time) DIAGNOSTIC STUDIES: Oxygen Saturation is 95% on RA, adequate by my interpretation.   COORDINATION OF  CARE: 7:36 PM- Will give dental resources. Will prescribe pain medication and antibiotic. Pt verbalizes understanding and agrees to plan.  Medications - No data to display  Labs Review Labs Reviewed - No data to display  Imaging Review No results found.   EKG Interpretation None      MDM   Final diagnoses:  Dental decay  Pain due to dental caries    Pt presenting to ED with c/o dental pain. Dental decay present on exam. No gingival abscess. No airway involvement. Will tx with amoxicillin and tramadol. Pt has  ibuprofen at home.  Advised pt to call to schedule f/u appointment with Dr. Mayford Knife, DDS, for further evaluation and treatment of dental pain. Dental resource guide provided. Also advised to establish care with a PCP at [facility] for ongoing healthcare needs. Return precautions provided. Pt verbalized understanding and agreement with tx plan.   I personally performed the services described in this documentation, which was scribed in my presence. The recorded information has been reviewed and is accurate.    Junius Finner, PA-C 07/31/14 1947  Bethann Berkshire, MD 08/01/14 [id number]

## 2014-07-31 NOTE — Discharge Instructions (Signed)
Dental Care and Dentist Visits Dental care supports good overall health. Regular dental visits can also help you avoid dental pain, bleeding, infection, and other more serious health problems in the future. It is important to keep the mouth healthy because diseases in the teeth, gums, and other oral tissues can spread to other areas of the body. Some problems, such as diabetes, heart disease, and pre-term labor have been associated with poor oral health.  See your dentist every 6 months. If you experience emergency problems such as a toothache or broken tooth, go to the dentist right away. If you see your dentist regularly, you may catch problems early. It is easier to be treated for problems in the early stages.  WHAT TO EXPECT AT A DENTIST VISIT  Your dentist will look for many common oral health problems and recommend proper treatment. At your regular dental visit, you can expect:  Gentle cleaning of the teeth and gums. This includes scraping and polishing. This helps to remove the sticky substance around the teeth and gums (plaque). Plaque forms in the mouth shortly after eating. Over time, plaque hardens on the teeth as tartar. If tartar is not removed regularly, it can cause problems. Cleaning also helps remove stains.  Periodic X-rays. These pictures of the teeth and supporting bone will help your dentist assess the health of your teeth.  Periodic fluoride treatments. Fluoride is a natural mineral shown to help strengthen teeth. Fluoride treatmentinvolves applying a fluoride gel or varnish to the teeth. It is most commonly done in children.  Examination of the mouth, tongue, jaws, teeth, and gums to look for any oral health problems, such as:  Cavities (dental caries). This is decay on the tooth caused by plaque, sugar, and acid in the mouth. It is best to catch a cavity when it is small.  Inflammation of the gums caused by plaque buildup (gingivitis).  Problems with the mouth or malformed  or misaligned teeth.  Oral cancer or other diseases of the soft tissues or jaws. KEEP YOUR TEETH AND GUMS HEALTHY For healthy teeth and gums, follow these general guidelines as well as your dentist's specific advice:  Have your teeth professionally cleaned at the dentist every 6 months.  Brush twice daily with a fluoride toothpaste.  Floss your teeth daily.  Ask your dentist if you need fluoride supplements, treatments, or fluoride toothpaste.  Eat a healthy diet. Reduce foods and drinks with added sugar.  Avoid smoking. TREATMENT FOR ORAL HEALTH PROBLEMS If you have oral health problems, treatment varies depending on the conditions present in your teeth and gums.  Your caregiver will most likely recommend good oral hygiene at each visit.  For cavities, gingivitis, or other oral health disease, your caregiver will perform a procedure to treat the problem. This is typically done at a separate appointment. Sometimes your caregiver will refer you to another dental specialist for specific tooth problems or for surgery. SEEK IMMEDIATE DENTAL CARE IF:  You have pain, bleeding, or soreness in the gum, tooth, jaw, or mouth area.  A permanent tooth becomes loose or separated from the gum socket.  You experience a blow or injury to the mouth or jaw area. Document Released: 12/17/2010 Document Revised: 06/29/2011 Document Reviewed: 12/17/2010 [facility] Patient Information 2015 [place], [place]. This information is not intended to replace advice given to you by your health care provider. Make sure you discuss any questions you have with your health care provider.  Dental Extraction A dental extraction procedure refers to  a routine tooth extraction performed by your dentist. The procedure depends on where and how the tooth is positioned. The procedure can be very quick, sometimes lasting only seconds. Reasons for dental extraction include:  Tooth decay.  Infections (abscesses).  The need  to make room for other teeth.  Gum diseases where the supporting bone has been destroyed.  Fractures of the tooth leaving it unrestorable.  Extra teeth (supernumerary) or grossly malformed teeth.  Baby teeththat have not fallen out in time and have not permitted the the permanent teeth to erupt properly.  In preparation for braces where there is not enough room to align the teeth properly.  Not enough room for wisdom teeth (particularly those that are impacted).  Prior to receiving radiation to the head and neck,teeth in the field of radiation may need to be extracted. LET YOUR CAREGIVER KNOW ABOUT:  Any allergies.  All medicines you are taking:  Including herbs, eye drops, over-the-counter medications, and creams.  Blood thinners (anticoagulants), aspirin, or other drugs that may affect blood clotting.  Use of steroids (through mouth or as creams).  Previous problems with anesthetics, including local anesthetics.  History of bleeding or blood problems.  Previous surgery.  Possibility of pregnancy if this applies.  Smoking history.  Any health problems. RISKS AND COMPLICATIONS As with any procedure, complications may occur, but they can usually be managed by your caregiver. General surgical complications may include:  Reaction to anesthesia.  Damage to surrounding teeth, nerves, tissues, or structures.  Infection.  Bleeding. With appropriate treatment and care after surgery, the following complications are very uncommon:  Dry socket (blood clot does not form or stay in place over empty socket). This can delay healing.  Incomplete extraction of roots.  Jawbone injury, pain, or weakness. BEFORE THE PROCEDURE  Your dental care provider will:  Take a medical and dental history.  Take an X-ray to evaluate the circumstances and how to best extract the tooth.  Do an oral exam.  Depending on the situation, antibiotics may be given before or after the  extraction.  Your caregivers may review the procedure, the local anesthesia and/or sedation being used, and what to expect after the procedure with you.  If needed, your dentist may give you a form of sedation, either by medicine you swallow, gas, or intravenously (IV). This will help to relieve anxiety. Complicated extractions may require the use of general anesthesia. It is important to follow your caregiver's instructions prior to your procedure to avoid complications. Steps before your procedure may include:  Alert your caregiver if you feel ill (sore throat, fever, upset stomach, etc.) in the days leading up to your procedure.  Stop taking certain medications for several days prior to your procedure such as blood thinners.  Take certain medications, such as antibiotics.  Avoid eating and drinking for several hours before the procedure. This will help you to avoid complications from the sedation or anesthesia.  Sign a patient consent form.  Have a friend or family member drive you to the dentist and drive you home after the procedure.  Wear comfortable, loose clothing. Limit makeup and jewelry.  Quit smoking. If you are a smoker, this will raise the chances of a healing problem after your procedure. If you are thinking about quitting, talk to your surgeon about how long before the operation you should stop smoking. You may also get help from your primary caregiver. PROCEDURE Dental extraction is typically done as an outpatient procedure. IV sedation, local  anesthesia, or both may be used. It will keep you comfortable and free of pain during the procedure.  There are 2 types of extractions:  Simple extraction involves a tooth that is visible in the mouth and above the gum line. After local anesthetic is given by injection, and the area is numbed, the dentist will loosen the tooth with a special instrument (elevator). Then another instrument (forceps) will be used to grasp the tooth and  remove it from its socket. During the procedure you will feel some pressure, but you should not feel pain. If you do feel pain, tell your dentist. The open socket will be cleaned. Dressings (gauze) will be placed in the socket to reduce bleeding.  Surgical extractions are used if the tooth has not come into the mouth or the tooth is broken off below the gum line. The dentist will make a cut (incision) in the gum and may have to remove some of the bone around the tooth to aid in the removal of the tooth. After removal, stitches (sutures) may be required to close the area to help in healing and control bleeding. For some surgical extractions, you may need a general anesthetic or IV sedation (through the vein). After both types of extractions, you may be given pain medication or other drugs to help healing. Other postoperative instructions will be given by your dental caregiver. AFTER THE PROCEDURE  You will have gauze in your mouth where the tooth was removed. Gentle pressure on the gauze for up to 1 hour will help to control bleeding.  A blood clot will begin to form over the open socket. This is normal. Do not touch the area or rinse it.  Your pain will be controlled with medication and self-care.  You will be given detailed instructions for care after surgery. PROGNOSIS While some discomfort is normal after tooth extraction, most patients recover fully in just a few days. SEEK IMMEDIATE DENTAL CARE  You have uncontrolled bleeding, marked swelling, or severe pain.  You develop a fever, difficulty swallowing, or other severe symptoms.  You have questions or concerns. Document Released: 04/06/2005 Document Revised: 08/21/2013 Document Reviewed: 07/11/2010 [facility] Patient Information 2015 [place], [place]. This information is not intended to replace advice given to you by your health care provider. Make sure you discuss any questions you have with your health care provider.  Dental  Pain Toothache is pain in or around a tooth. It may get worse with chewing or with cold or heat.  HOME CARE  Your dentist may use a numbing medicine during treatment. If so, you may need to avoid eating until the medicine wears off. Ask your dentist about this.  Only take medicine as told by your dentist or doctor.  Avoid chewing food near the painful tooth until after all treatment is done. Ask your dentist about this. GET HELP RIGHT AWAY IF:   The problem gets worse or new problems appear.  You have a fever.  There is redness and puffiness (swelling) of the face, jaw, or neck.  You cannot open your mouth.  There is pain in the jaw.  There is very bad pain that is not helped by medicine. MAKE SURE YOU:   Understand these instructions.  Will watch your condition.  Will get help right away if you are not doing well or get worse. Document Released: 09/23/2007 Document Revised: 06/29/2011 Document Reviewed: 09/23/2007 [facility] Patient Information 2015 [place], [place]. This information is not intended to replace advice given to  you by your health care provider. Make sure you discuss any questions you have with your health care provider.  Patients with Medicaid: [facility] [phone] [address], [phone], [phone]  If unable to pay, or uninsured, contact [facility] [phone]) or [facility] Department [phone] in [place], [phone] in [facility]) to become qualified for the adult dental clinic  Other Low-Cost Community Dental Services: [facility] [address] Natasha Bence [place], [place], [address]    [phone], Ext. [phone]    2nd and 4th Thursday of the month at 6:30am    10 clients each day by appointment, can sometimes see walk-in patients if someone does not show for an appointment [facility]- [address] [place], [place], [address]    [phone] [facility] [address],  [place], [place], [address]    [phone]  [facility] Health Department- [phone] [facility] Health Department- [phone] [facility] Department- [phone]

## 2014-07-31 NOTE — ED Notes (Signed)
Pt reports severe pain in the er/side of her mouth. Also c/o r/ear pressure, and neck pain. Pain stated last night. Treated once with Motrin

## 2014-10-23 ENCOUNTER — Encounter (HOSPITAL_COMMUNITY): Payer: Self-pay | Admitting: Emergency Medicine

## 2014-10-23 ENCOUNTER — Emergency Department (HOSPITAL_COMMUNITY)
Admission: EM | Admit: 2014-10-23 | Discharge: 2014-10-23 | Disposition: A | Discharge status: Home / Self Care | Payer: Medicaid Other | Attending: Emergency Medicine | Admitting: Emergency Medicine

## 2014-10-23 DIAGNOSIS — R531 Weakness: Secondary | ICD-10-CM | POA: Insufficient documentation

## 2014-10-23 DIAGNOSIS — Z720 Tobacco use: Secondary | ICD-10-CM | POA: Insufficient documentation

## 2014-10-23 DIAGNOSIS — Z9889 Other specified postprocedural states: Secondary | ICD-10-CM | POA: Insufficient documentation

## 2014-10-23 DIAGNOSIS — Z91040 Latex allergy status: Secondary | ICD-10-CM | POA: Insufficient documentation

## 2014-10-23 DIAGNOSIS — N939 Abnormal uterine and vaginal bleeding, unspecified: Secondary | ICD-10-CM

## 2014-10-23 LAB — CBC WITH DIFFERENTIAL/PLATELET
BASOS ABS: 0.1 10*3/uL (ref 0.0–0.1)
BASOS PCT: 1 % (ref 0–1)
EOS PCT: 2 % (ref 0–5)
Eosinophils Absolute: 0.2 10*3/uL (ref 0.0–0.7)
HCT: 37.4 % (ref 36.0–46.0)
Hemoglobin: 12 g/dL (ref 12.0–15.0)
Lymphocytes Relative: 22 % (ref 12–46)
Lymphs Abs: 2.3 10*3/uL (ref 0.7–4.0)
MCH: 26.8 pg (ref 26.0–34.0)
MCHC: 32.1 g/dL (ref 30.0–36.0)
MCV: 83.7 fL (ref 78.0–100.0)
Monocytes Absolute: 0.9 10*3/uL (ref 0.1–1.0)
Monocytes Relative: 9 % (ref 3–12)
Neutro Abs: 6.9 10*3/uL (ref 1.7–7.7)
Neutrophils Relative %: 66 % (ref 43–77)
PLATELETS: 250 10*3/uL (ref 150–400)
RBC: 4.47 MIL/uL (ref 3.87–5.11)
RDW: 14.4 % (ref 11.5–15.5)
WBC: 10.4 10*3/uL (ref 4.0–10.5)

## 2014-10-23 LAB — I-STAT CHEM 8, ED
BUN: 9 mg/dL (ref 6–20)
CHLORIDE: 104 mmol/L (ref 101–111)
CREATININE: 0.7 mg/dL (ref 0.44–1.00)
Calcium, Ion: 1.19 mmol/L (ref 1.12–1.23)
GLUCOSE: 93 mg/dL (ref 65–99)
HCT: 40 % (ref 36.0–46.0)
Hemoglobin: 13.6 g/dL (ref 12.0–15.0)
Potassium: 3.9 mmol/L (ref 3.5–5.1)
SODIUM: 140 mmol/L (ref 135–145)
TCO2: 25 mmol/L (ref 0–100)

## 2014-10-23 NOTE — ED Provider Notes (Signed)
CSN: [id number]     Arrival date & time 10/23/14  1235 History   First MD Initiated Contact with Patient 10/23/14 1456     Chief Complaint  Patient presents with  . Vaginal Bleeding     (Consider location/radiation/quality/duration/timing/severity/associated sxs/prior Treatment) Patient is a 29 y.o. female presenting with vaginal bleeding. The history is provided by the patient. No language interpreter was used.  Vaginal Bleeding Associated symptoms: no dizziness, no dysuria and no fever   Ms. Weigand is a 29 y.o female who presents with vaginal bleeding since September 19, 2014. She states she has been using 7-8 super pads/day.  She also states her periods have always been irregular and this not unusual for her but she has been feeling weak the last 2-3 days. She states she was seen at the health department on October 19, 2014 for a routine check up and was told that she could not start oral contraception of due to hypertension. She had a Pap smear and was checked for STDs which she states were all normal. She denies any dizziness, palpitations, abdominal pain, nausea, vomiting, diarrhea, dysuria, urinary frequency.  Past Medical History  Diagnosis Date  . Medical history non-contributory   . Abnormal Pap smear    Past Surgical History  Procedure Laterality Date  . No past surgeries    . Colposcopy     Family History  Problem Relation Age of Onset  . Hypertension Father   . Diabetes Father    History  Substance Use Topics  . Smoking status: Current Every Day Smoker -- 1.00 packs/day  . Smokeless tobacco: Not on file  . Alcohol Use: Yes     Comment: occasionally   OB History    Gravida Para Term Preterm AB TAB SAB Ectopic Multiple Living   3 2 2  0 1 0 0 0 0 2     Review of Systems  Constitutional: Negative for fever.  Genitourinary: Positive for vaginal bleeding and menstrual problem. Negative for dysuria, urgency and pelvic pain.  Neurological: Positive for weakness. Negative for  dizziness, syncope and light-headedness.  All other systems reviewed and are negative.     Allergies  Latex  Home Medications   Prior to Admission medications   Medication Sig Start Date End Date Taking? Authorizing Provider  traMADol (ULTRAM) 50 MG tablet Take 1 tablet (50 mg total) by mouth every 6 (six) hours as needed. Patient not taking: Reported on 10/23/2014 07/31/14   Junius FinnerErin O'Malley, PA-C   BP 123/69 mmHg  Pulse 71  Temp(Src) 97.8 F (36.6 C) (Oral)  Resp 18  SpO2 97%  LMP 09/19/2014 Physical Exam  Constitutional: She is oriented to person, place, and time. She appears well-developed and well-nourished.  HENT:  Head: Normocephalic.  Eyes: Conjunctivae are normal.  Neck: Normal range of motion.  Cardiovascular: Normal rate, regular rhythm and normal heart sounds.   Pulmonary/Chest: Effort normal and breath sounds normal. No respiratory distress. She has no wheezes. She has no rales.  Abdominal: Soft. She exhibits no distension and no mass. There is no tenderness. There is no rigidity, no rebound and no guarding.  Genitourinary:  Pelvic exam: Chaperone present.  Vaginal bleeding but no clots. Cervical os closed. No adnexal tenderness.   Musculoskeletal: Normal range of motion.  Neurological: She is alert and oriented to person, place, and time. She has normal strength. No sensory deficit. GCS eye subscore is 4. GCS verbal subscore is 5. GCS motor subscore is 6.  Skin: Skin is  warm and dry.  Psychiatric: She has a normal mood and affect. Her behavior is normal.  Nursing note and vitals reviewed.   ED Course  Procedures (including critical care time) Labs Review Labs Reviewed  CBC WITH DIFFERENTIAL/PLATELET  I-STAT CHEM 8, ED    Imaging Review No results found.   EKG Interpretation None      MDM   Final diagnoses:  Vaginal bleeding   Patient is not tachycardic and not hypotensive.  She is well appearing and in no acute distress. Patient's labs are  unremarkable with a normal hemoglobin. She is ambulatory with a steady gait. She can follow up outpatient with women's clinic and verbally agrees with the plan. I discussed return precautions such as dizziness or lightheadedness.    Catha Gosselin, PA-C 10/23/14 1553  Derwood Kaplan, MD 10/25/14 1610

## 2014-10-23 NOTE — Discharge Instructions (Signed)
Abnormal Uterine Bleeding Return for any dizziness or light headedness.  Follow up with women's outpatient clinic.  Abnormal uterine bleeding can affect women at various stages in life, including teenagers, women in their reproductive years, pregnant women, and women who have reached menopause. Several kinds of uterine bleeding are considered abnormal, including:  Bleeding or spotting between periods.   Bleeding after sexual intercourse.   Bleeding that is heavier or more than normal.   Periods that last longer than usual.  Bleeding after menopause.  Many cases of abnormal uterine bleeding are minor and simple to treat, while others are more serious. Any type of abnormal bleeding should be evaluated by your health care provider. Treatment will depend on the cause of the bleeding. HOME CARE INSTRUCTIONS Monitor your condition for any changes. The following actions may help to alleviate any discomfort you are experiencing:  Avoid the use of tampons and douches as directed by your health care provider.  Change your pads frequently. You should get regular pelvic exams and Pap tests. Keep all follow-up appointments for diagnostic tests as directed by your health care provider.  SEEK MEDICAL CARE IF:   Your bleeding lasts more than 1 week.   You feel dizzy at times.  SEEK IMMEDIATE MEDICAL CARE IF:   You pass out.   You are changing pads every 15 to 30 minutes.   You have abdominal pain.  You have a fever.   You become sweaty or weak.   You are passing large blood clots from the vagina.   You start to feel nauseous and vomit. MAKE SURE YOU:   Understand these instructions.  Will watch your condition.  Will get help right away if you are not doing well or get worse. Document Released: 04/06/2005 Document Revised: 04/11/2013 Document Reviewed: 11/03/2012 [facility] Patient Information 2015 [place], [place]. This information is not intended to replace advice given to  you by your health care provider. Make sure you discuss any questions you have with your health care provider.

## 2014-10-23 NOTE — ED Notes (Signed)
Pelvic cart ready at bedside 

## 2014-10-23 NOTE — ED Notes (Signed)
Pt c/o prolonged menstruation, states she has been on her menstrual cycle since September 19, 2014, pt states that today she began feeling dizzy and she has felt weak x 2 days. Pt states she has been passing blood clots and she saturates 6-7 super pads per day, states she typically requires 2 super pads to contain bleeding. Pt saw her PCP for this, PCP states pt cannot initiate oral contraceptives due to hypertension.

## 2015-05-04 ENCOUNTER — Encounter (HOSPITAL_COMMUNITY): Payer: Self-pay | Admitting: Emergency Medicine

## 2015-05-04 ENCOUNTER — Emergency Department (HOSPITAL_COMMUNITY)
Admission: EM | Admit: 2015-05-04 | Discharge: 2015-05-04 | Disposition: A | Discharge status: Home / Self Care | Payer: Medicaid Other | Attending: Emergency Medicine | Admitting: Emergency Medicine

## 2015-05-04 DIAGNOSIS — Z91040 Latex allergy status: Secondary | ICD-10-CM | POA: Insufficient documentation

## 2015-05-04 DIAGNOSIS — R519 Headache, unspecified: Secondary | ICD-10-CM

## 2015-05-04 DIAGNOSIS — H9201 Otalgia, right ear: Secondary | ICD-10-CM | POA: Insufficient documentation

## 2015-05-04 DIAGNOSIS — H53149 Visual discomfort, unspecified: Secondary | ICD-10-CM | POA: Insufficient documentation

## 2015-05-04 DIAGNOSIS — R42 Dizziness and giddiness: Secondary | ICD-10-CM | POA: Insufficient documentation

## 2015-05-04 DIAGNOSIS — R51 Headache: Secondary | ICD-10-CM | POA: Insufficient documentation

## 2015-05-04 DIAGNOSIS — M549 Dorsalgia, unspecified: Secondary | ICD-10-CM | POA: Insufficient documentation

## 2015-05-04 DIAGNOSIS — R05 Cough: Secondary | ICD-10-CM | POA: Insufficient documentation

## 2015-05-04 DIAGNOSIS — R079 Chest pain, unspecified: Secondary | ICD-10-CM | POA: Insufficient documentation

## 2015-05-04 MED ORDER — KETOROLAC TROMETHAMINE 60 MG/2ML IM SOLN
60.0000 mg | Freq: Once | INTRAMUSCULAR | Status: AC
  Administered 2015-05-04: 60 mg via INTRAMUSCULAR
  Filled 2015-05-04: qty 2

## 2015-05-04 MED ORDER — IBUPROFEN 800 MG PO TABS: 800.0000 mg | ORAL_TABLET | Freq: Three times a day (TID) | ORAL | Status: DC | PRN

## 2015-05-04 MED ORDER — PSEUDOEPHEDRINE HCL 60 MG PO TABS: 60.0000 mg | ORAL_TABLET | Freq: Four times a day (QID) | ORAL | Status: DC | PRN

## 2015-05-04 NOTE — ED Notes (Signed)
Pt states that she has been having nasal congestion, headache, and dry cough.  Cough for 2 wks but nasal congestion/headache for 3 days.  Pt states that the headache is consists of facial pressure and points to her forehead and cheeks.  States "she just wanted to make sure she was ok".  Denies abd pain, NVD.

## 2015-05-04 NOTE — ED Provider Notes (Signed)
CSN: [id number]     Arrival date & time 05/04/15  1545 History  By signing my name below, I, Julie Hendrix, attest that this documentation has been prepared under the direction and in the presence of Trixie DredgeEmily Tage Feggins, PA-C Electronically Signed: Soijett Hendrix, ED Scribe. 05/04/2015. 6:02 PM.  Chief Complaint  Patient presents with  . Nasal Congestion  . Headache  . Cough      The history is provided by the patient. No language interpreter was used.    Julie Hendrix is a 30 y.o. female who presents to the Emergency Department complaining of constant, right sided HA onset 3 days. She reports that her symptoms began with a dry cough that progressed into a productive cough. She states that she is having associated symptoms of dry cough x 2 weeks that progressed into a mildly resolved productive cough, sinus pressure worsened with leaning over, sharp, intermittent, non-exertional left sided CP x 1-2 weeks lasting 10 minutes, intermittent lightheadedness x 1-2 months, right upper back pain, right ear, and photophobia. She notes that she was either at work or home when the HA began. She reports that her last episode of CP was while she was in the lobby and it has resolved since waiting in her room. Pt notes that her CP is also reproducible with palpation and her CP will occur for months or years.   She states that she has not tried any medications for the relief for her symptoms. She denies sore throat, rhinorrhea, congestion, post-nasal drip, n/v/d, abdominal pain, SOB, leg swelling, left ear pain, numbness, weakness, and any other symptoms. She denies any recent travel, immobilizations, hospitalizations, surgeries, PMHx of blood clots, or estrogen use. Denies being a diabetic or having neck issues at this time. Pt denies having a PCP at this time.   Past Medical History  Diagnosis Date  . Medical history non-contributory   . Abnormal Pap smear    Past Surgical History  Procedure Laterality Date  . No  past surgeries    . Colposcopy     Family History  Problem Relation Age of Onset  . Hypertension Father   . Diabetes Father    Social History  Substance Use Topics  . Smoking status: Current Every Day Smoker -- 1.00 packs/day  . Smokeless tobacco: None  . Alcohol Use: Yes     Comment: occasionally   OB History    Gravida Para Term Preterm AB TAB SAB Ectopic Multiple Living   3 2 2  0 1 0 0 0 0 2     Review of Systems  Constitutional: Negative for fever.  HENT: Positive for ear pain and sinus pressure. Negative for congestion, postnasal drip, rhinorrhea and sore throat.   Eyes: Positive for photophobia (mild).  Respiratory: Positive for cough. Negative for shortness of breath.   Cardiovascular: Positive for chest pain. Negative for leg swelling.  Gastrointestinal: Negative for nausea, vomiting, abdominal pain and diarrhea.       No bowel incontinence  Genitourinary:       No bladder incontinence  Musculoskeletal: Positive for back pain. Negative for neck pain and neck stiffness.  Skin: Negative for color change, rash and wound.  Neurological: Positive for light-headedness and headaches. Negative for weakness and numbness.      Allergies  Latex  Home Medications   Prior to Admission medications   Medication Sig Start Date End Date Taking? Authorizing Provider  ibuprofen (ADVIL,MOTRIN) 800 MG tablet Take 1 tablet (800 mg total) by mouth every  8 (eight) hours as needed for mild pain or moderate pain. 05/04/15   [name], PA-C  pseudoephedrine (SUDAFED) 60 MG tablet Take 1 tablet (60 mg total) by mouth every 6 (six) hours as needed for congestion. 05/04/15   [name], PA-C  traMADol (ULTRAM) 50 MG tablet Take 1 tablet (50 mg total) by mouth every 6 (six) hours as needed. Patient not taking: Reported on 10/23/2014 07/31/14   Junius Finner, PA-C   BP 134/74 mmHg  Pulse 87  Temp(Src) 98.7 F (37.1 C) (Oral)  Resp 18  SpO2 100% Physical Exam  Constitutional: She appears  well-developed and well-nourished. No distress.  HENT:  Head: Normocephalic and atraumatic.  Right Ear: Tympanic membrane and ear canal normal.  Left Ear: Tympanic membrane and ear canal normal.  Nose: Nose normal.  Mouth/Throat: Oropharynx is clear and moist. No oropharyngeal exudate.  Mild tenderness across forehead  Eyes: Conjunctivae are normal.  Neck: Neck supple.  Cardiovascular: Normal rate and regular rhythm.   Pulmonary/Chest: Effort normal and breath sounds normal. No respiratory distress. She has no wheezes. She has no rales.  Neurological: She is alert. No cranial nerve deficit.  Cranial nerves intact. CN II-XII intact, EOMs intact, no pronator drift, grip strengths equal bilaterally; strength 5/5 in all extremities, sensation intact in all extremities; finger to nose, heel to shin, rapid alternating movements normal; gait is normal.     Skin: She is not diaphoretic.  Nursing note and vitals reviewed.   ED Course  Procedures (including critical care time) DIAGNOSTIC STUDIES: Oxygen Saturation is 100% on RA, nl by my interpretation.    COORDINATION OF CARE: 5:45 PM Discussed treatment plan with pt at bedside which includes referral to PCP and pt agreed to plan.    Labs Review Labs Reviewed - No data to display  Imaging Review No results found.    EKG Interpretation None      MDM   Final diagnoses:  Acute nonintractable headache, unspecified headache type    Afebrile, nontoxic patient with several days of frontal headache, worse with leaning forward.  Has had recent URI, possibly sinus headache vs more likely tension headache.  No red flags.   D/C home with motrin, sudafed, PCP follow up.  Discussed result, findings, treatment, and follow up  with patient.  Pt given return precautions.  Pt verbalizes understanding and agrees with plan.       Pt PERC negative.  I personally performed the services described in this documentation, which was scribed in my  presence. The recorded information has been reviewed and is accurate.    [name], PA-C 05/04/15 1959  Rolland Porter, MD 05/09/15 1046

## 2015-05-04 NOTE — Discharge Instructions (Signed)
Read the information below.  Use the prescribed medication as directed.  Please discuss all new medications with your pharmacist.  You may return to the Emergency Department at any time for worsening condition or any new symptoms that concern you.    ° °You are having a headache. No specific cause was found today for your headache. It may have been a migraine or other cause of headache. Stress, anxiety, fatigue, and depression are common triggers for headaches. Your headache today does not appear to be life-threatening or require hospitalization, but often the exact cause of headaches is not determined in the emergency department. Therefore, follow-up with your doctor is very important to find out what may have caused your headache, and whether or not you need any further diagnostic testing or treatment. Sometimes headaches can appear benign (not harmful), but then more serious symptoms can develop which should prompt an immediate re-evaluation by your doctor or the emergency department. °SEEK MEDICAL ATTENTION IF: °You develop possible problems with medications prescribed.  °The medications don't resolve your headache, if it recurs , or if you have multiple episodes of vomiting or can't take fluids. °You have a change from the usual headache. °RETURN IMMEDIATELY IF you develop a sudden, severe headache or confusion, become poorly responsive or faint, develop a fever above 100.4F or problem breathing, have a change in speech, vision, swallowing, or understanding, or develop new weakness, numbness, tingling, incoordination, or have a seizure. ° ° ° °General Headache Without Cause °A headache is pain or discomfort felt around the head or neck area. The specific cause of a headache may not be found. There are many causes and types of headaches. A few common ones are: °· Tension headaches. °· Migraine headaches. °· Cluster headaches. °· Chronic daily headaches. °HOME CARE INSTRUCTIONS  °Watch your condition for any  changes. Take these steps to help with your condition: °Managing Pain °· Take over-the-counter and prescription medicines only as told by your health care provider. °· Lie down in a dark, quiet room when you have a headache. °· If directed, apply ice to the head and neck area: °¨ Put ice in a plastic bag. °¨ Place a towel between your skin and the bag. °¨ Leave the ice on for 20 minutes, 2-3 times per day. °· Use a heating pad or hot shower to apply heat to the head and neck area as told by your health care provider. °· Keep lights dim if bright lights bother you or make your headaches worse. °Eating and Drinking °· Eat meals on a regular schedule. °· Limit alcohol use. °· Decrease the amount of caffeine you drink, or stop drinking caffeine. °General Instructions °· Keep all follow-up visits as told by your health care provider. This is important. °· Keep a headache journal to help find out what may trigger your headaches. For example, write down: °¨ What you eat and drink. °¨ How much sleep you get. °¨ Any change to your diet or medicines. °· Try massage or other relaxation techniques. °· Limit stress. °· Sit up straight, and do not tense your muscles. °· Do not use tobacco products, including cigarettes, chewing tobacco, or e-cigarettes. If you need help quitting, ask your health care provider. °· Exercise regularly as told by your health care provider. °· Sleep on a regular schedule. Get 7-9 hours of sleep, or the amount recommended by your health care provider. °SEEK MEDICAL CARE IF:  °· Your symptoms are not helped by medicine. °· You have a   headache that is different from the usual headache. °· You have nausea or you vomit. °· You have a fever. °SEEK IMMEDIATE MEDICAL CARE IF:  °· Your headache becomes severe. °· You have repeated vomiting. °· You have a stiff neck. °· You have a loss of vision. °· You have problems with speech. °· You have pain in the eye or ear. °· You have muscular weakness or loss of  muscle control. °· You lose your balance or have trouble walking. °· You feel faint or pass out. °· You have confusion. °  °This information is not intended to replace advice given to you by your health care provider. Make sure you discuss any questions you have with your health care provider. °  °Document Released: 04/06/2005 Document Revised: 12/26/2014 Document Reviewed: 07/30/2014 °Elsevier Interactive Patient Education ©2016 Elsevier Inc. ° ° °Emergency Department Resource Guide °1) Find a Doctor and Pay Out of Pocket °Although you won't have to find out who is covered by your insurance plan, it is a good idea to ask around and get recommendations. You will then need to call the office and see if the doctor you have chosen will accept you as a new patient and what types of options they offer for patients who are self-pay. Some doctors offer discounts or will set up payment plans for their patients who do not have insurance, but you will need to ask so you aren't surprised when you get to your appointment. ° °2) Contact Your Local Health Department °Not all health departments have doctors that can see patients for sick visits, but many do, so it is worth a call to see if yours does. If you don't know where your local health department is, you can check in your phone book. The CDC also has a tool to help you locate your state's health department, and many state websites also have listings of all of their local health departments. ° °3) Find a Walk-in Clinic °If your illness is not likely to be very severe or complicated, you may want to try a walk in clinic. These are popping up all over the country in pharmacies, drugstores, and shopping centers. They're usually staffed by nurse practitioners or physician assistants that have been trained to treat common illnesses and complaints. They're usually fairly quick and inexpensive. However, if you have serious medical issues or chronic medical problems, these are  probably not your best option. ° °No Primary Care Doctor: °- Call [facility] at  832-8000 - they can help you locate a primary care doctor that  accepts your insurance, provides certain services, etc. °- Physician Referral Service- 1-800-533-3463 ° °Chronic Pain Problems: °Organization         Address  Phone   Notes  °[facility]  (336) 297-2271 Patients need to be referred by their primary care doctor.  ° °Medication Assistance: °Organization         Address  Phone   Notes  °[place] [facility] [address] °[place], [place] [address] (336) 641-8030 --Must be a resident of [place] °-- Must have NO insurance coverage whatsoever (no Medicaid/ Medicare, etc.) °-- The pt. MUST have a primary care doctor that directs their care regularly and follows them in the community °  °[facility]  (866) 331-1348   °[employer]  (888) 892-1162   ° °Agencies that provide inexpensive medical care: °Organization         Address  Phone     Notes  °[facility]  (336) 832-8035   °[facility]    (336) 832-7272   °[facility] [address] °[place], [place] [address] (336) 832-4777   °[facility] [address], °[place] (336) 271-4999   °[facility]    (336) 373-0678   °[facility]    (336) 272-1050   °[facility] ° [address], [place] Phone:  (336) 832-4444, Fax:  (336) 832-4440 Hours of Operation:  9 am - 6 pm, M-F.  Also accepts Medicaid/Medicare and self-pay.  °[facility] ° [address], [place] Phone: (336) 832-3150, Fax: (336) 832-3151. Hours of Operation:  8:30 am - 5:30 pm, M-F.  Also accepts Medicaid and self-pay.  °[facility] [place] [address], [place] Phone: (336) 878-6027   °[facility] [address], [place], [place] (336)723-1848, Ext. 123 Mondays & Thursdays:  7-9 AM.  First 15 patients are seen on a first come, first serve basis. °  ° °Medicaid-accepting [place] Providers: ° °Organization         Address  Phone   Notes  °[facility] [address], [place] (336) 641-2100 Also accepts self-pay patients.  °[facility] [address], [place] ° (336) 856-9996   °[facility] [address], [place] (336) 288-8857   °[facility] [address] [place] [address], [place] (336) 299-7000   °Veita Bland [address], [place]  ° (336) 373-1557 Only accepts [place] Medicaid patients after they have their name applied to their card.  ° °Self-Pay (no insurance) in [place]: ° °Organization         Address  Phone   Notes  °Sickle Cell Patients, [facility] [address], [place] (336) 832-1970   °[facility] [address], [place] (336) 832-4400   ° [facility] ° [address], [place] (336) 992-4800   °[facility]/Dr. Osei-Bonsu ° [address] [place] [address], [place] or [address], [place] (336) 841-8500 Phone number for both [place] and [place] locations is the same.  °[facility] [address], [place] (336) 299-0000   °[facility] [address] [place] [address], [place] or [address] (336) 852-7530 °(336) 878-2260   °[facility] [address], [place] (336) 350-1642, phone; (336) 294-5005, fax Sees patients 1st and 3rd Saturday of every month.  Must not qualify for public or private insurance (i.e. Medicaid, Medicare, [place] Health Choice, Veterans' Benefits) • Household income should be no more than 200% of the poverty level •The clinic cannot treat you if you are pregnant or think you are pregnant • Sexually transmitted diseases are not treated at the clinic.   ° ° °Dental Care: °Organization         Address  Phone  Notes  °[place] [facility] [address],  (336) 641-6152 Accepts children up to age 21 who are enrolled in Medicaid or [place] Health Choice; pregnant women with a Medicaid card; and children who have applied for Medicaid or [place] Health Choice, but were declined, whose parents can pay a reduced fee at time of service.  °[place]   [facility] [place]  [address], [place] (336) 641-7733 Accepts children up to age 21 who are enrolled in Medicaid or [place] Health Choice; pregnant women with a Medicaid card; and children who have applied for Medicaid or [place] Health Choice, but were declined, whose parents can pay a reduced fee at time of service.  °[facility] ° [address], [place] (336) 641-4533 Patients are seen by appointment only. Walk-ins are not accepted. [facility] will see patients 18 years of age and older. °Monday - Tuesday (8am-5pm) °Most Wednesdays (8:30-5pm) °$30 per visit, cash only  °[facility] ° [address], [place] (336) 641-4533 Patients are seen by appointment only. Walk-ins are not accepted. [facility] will see patients 18 years of age and older. °One Wednesday Evening (Monthly: Volunteer Based).  $30 per visit, cash only  °[facility]  (919) 537-3737 for adults; Children under age 4, call Graduate Pediatric Dentistry at (919) 537-3956. Children aged 4-14, please call (919) 537-3737 to request a pediatric application. ° Dental services are provided in all areas of dental care including fillings, crowns and bridges, complete and partial dentures, implants, gum treatment, root canals, and extractions. Preventive care is also provided. Treatment is provided to both adults and children. °Patients are selected via a lottery and there is often a waiting  list. °  °[facility] [address], °[place] ° (336) 763-8833 [email or web address] °  °[facility] [address], [place], [place] (336)723-1848, Ext. 123 Second and Fourth Thursday of each month, opens at 6:30 AM; Clinic ends at 9 AM.  Patients are seen on a first-come first-served basis, and a limited number are seen during each clinic.  ° °[facility] ° [address], [place], [place] (336) 723-7904   Eligibility Requirements °You must have lived in [place], [place], or [place] counties for at least the last three months. °  You cannot be eligible for state or federal sponsored healthcare insurance, including Veterans Administration, Medicaid, or Medicare. °  You generally cannot be eligible for healthcare insurance through your employer.  °  How to apply: °Eligibility screenings are held every Tuesday and Wednesday afternoon from 1:00 pm until 4:00 pm. You do not need an appointment for the interview!  °[facility] [address], [place], [place] 336-631-2330   °[facility]  336-342-8273   °[facility]  336-703-3100   °[facility]  336-570-6415   ° °Behavioral Health Resources in the Community: °Intensive Outpatient Programs °Organization         Address  Phone  Notes  °[facility] [address], [place], [place] 336-878-6098   °[facility] [address], [place], [place] 336-832-9800   °[facility] [address], [place], [place] ° 336-882-2125   °[facility] [address],  °, [place] 1-800-853-5163 or 336-641-4981   °Substance Abuse Resources °Organization         Address  Phone  Notes  °[facility]  336-882-2125   °[facility]  336-784-9470   °[facility]  336-285-9073   °[facility]  336-845-3988   °Residential & Outpatient Substance Abuse Program   1-800-659-3381   °Psychological Services °Organization         Address  Phone  Notes  °[facility]  336- 832-9600   °[facility]  336- 378-7881   °[facility] [address], [place] 1-800-853-5163 or 336-641-4981   ° °Mobile Crisis Teams °Organization         Address  Phone  Notes  °[facility], Mobile Crisis Care Unit  1-877-626-1772   °Assertive °[facility] ° [address] [place], [place] 336-834-9664   °Sharon DeEsch [address] °[place] 336-554-5454   ° °Self-Help/Support Groups °Organization         Address  Phone             Notes  °[facility] - variety of support groups  336- 373-1402 Call for more information  °Narcotics Anonymous (NA), [facility] [address], °[place]  2 meetings at this location  ° °Residential Treatment Programs °Organization         Address  Phone  Notes  °[facility] [address],    °[place]  1-866-801-8205   °[facility] ° [address], [place], [place] 704-293-8524   °[facility] [address], [place] 336-845-3988 Admissions: 8am-3pm M-F  °[facility] [address]    °[place], [place] 336-841-1104   °[facility] [address], [place], [place] 336-379-7146   °[facility] [address]  °[place], [place] 336-285-9073   °[facility] Intensive Outpatient [address], [place], [place] 336-852-3033   °[facility] [address]  °[place], [place] 1-877-615-2722 or 336-784-9470   °[facility] [address] [place], [place] 336-227-7417 Accepts Medicaid  °[facility] [address]  °[place] 1-800-659-3381 Substance Abuse/Addiction Treatment  ° °[place] Behavioral Health Resources °Organization          Address  Phone  Notes  °[facility]  (888) 581-9988   °Julie Brannon, PhD [address] [place], [place]   (336) 349-5553 or (336) 951-0000   °[facility]   [address] °[place], [place] (336) 349-4454   °[facility] [address], [place], [place] (336) 342-8316 Insurance/Medicaid/sponsorship through [facility]  °[facility] [address]                                    [place], [place] (336) 342-8316 Therapy/tele-psych/case  °[facility] [address]  ° [place], [place] (336) 349-2233    °Dr. Arfeen  (336) 349-4544   °[facility] [place]  [employer] [place] [facility] 1) [address], [place] °2) [address], [place] °3)  [address], [place] (336) 349-3220 °(336) 342-7768 ° °(336) 342-8140   °[place] [facility] (336) 342-1394 or (336) 342-3537 (After Hours)    ° ° ° ° °

## 2015-07-22 ENCOUNTER — Emergency Department (HOSPITAL_COMMUNITY)
Admission: EM | Admit: 2015-07-22 | Discharge: 2015-07-22 | Disposition: A | Discharge status: Home / Self Care | Payer: No Typology Code available for payment source | Attending: Emergency Medicine | Admitting: Emergency Medicine

## 2015-07-22 DIAGNOSIS — Y998 Other external cause status: Secondary | ICD-10-CM | POA: Diagnosis not present

## 2015-07-22 DIAGNOSIS — S6990XA Unspecified injury of unspecified wrist, hand and finger(s), initial encounter: Secondary | ICD-10-CM | POA: Insufficient documentation

## 2015-07-22 DIAGNOSIS — X58XXXA Exposure to other specified factors, initial encounter: Secondary | ICD-10-CM | POA: Insufficient documentation

## 2015-07-22 DIAGNOSIS — F17200 Nicotine dependence, unspecified, uncomplicated: Secondary | ICD-10-CM | POA: Insufficient documentation

## 2015-07-22 DIAGNOSIS — Y9289 Other specified places as the place of occurrence of the external cause: Secondary | ICD-10-CM | POA: Diagnosis not present

## 2015-07-22 DIAGNOSIS — Y9389 Activity, other specified: Secondary | ICD-10-CM | POA: Insufficient documentation

## 2016-05-10 ENCOUNTER — Inpatient Hospital Stay (HOSPITAL_COMMUNITY)
Admission: AD | Admit: 2016-05-10 | Discharge: 2016-05-10 | Disposition: A | Discharge status: Home / Self Care | Payer: Medicaid Other | Source: Ambulatory Visit | Attending: Obstetrics & Gynecology | Admitting: Obstetrics & Gynecology

## 2016-05-10 ENCOUNTER — Encounter (HOSPITAL_COMMUNITY): Payer: Self-pay | Admitting: *Deleted

## 2016-05-10 DIAGNOSIS — Z79899 Other long term (current) drug therapy: Secondary | ICD-10-CM | POA: Diagnosis not present

## 2016-05-10 DIAGNOSIS — Z9889 Other specified postprocedural states: Secondary | ICD-10-CM | POA: Insufficient documentation

## 2016-05-10 DIAGNOSIS — Z888 Allergy status to other drugs, medicaments and biological substances status: Secondary | ICD-10-CM | POA: Insufficient documentation

## 2016-05-10 DIAGNOSIS — N83209 Unspecified ovarian cyst, unspecified side: Secondary | ICD-10-CM

## 2016-05-10 DIAGNOSIS — Z8249 Family history of ischemic heart disease and other diseases of the circulatory system: Secondary | ICD-10-CM | POA: Insufficient documentation

## 2016-05-10 DIAGNOSIS — Z833 Family history of diabetes mellitus: Secondary | ICD-10-CM | POA: Diagnosis not present

## 2016-05-10 DIAGNOSIS — F17210 Nicotine dependence, cigarettes, uncomplicated: Secondary | ICD-10-CM | POA: Insufficient documentation

## 2016-05-10 DIAGNOSIS — R102 Pelvic and perineal pain: Secondary | ICD-10-CM | POA: Insufficient documentation

## 2016-05-10 LAB — URINALYSIS, ROUTINE W REFLEX MICROSCOPIC
BILIRUBIN URINE: NEGATIVE
GLUCOSE, UA: NEGATIVE mg/dL
HGB URINE DIPSTICK: NEGATIVE
Ketones, ur: NEGATIVE mg/dL
LEUKOCYTES UA: NEGATIVE
NITRITE: NEGATIVE
PROTEIN: 100 mg/dL — AB
Specific Gravity, Urine: 1.014 (ref 1.005–1.030)
pH: 6 (ref 5.0–8.0)

## 2016-05-10 LAB — WET PREP, GENITAL
Sperm: NONE SEEN
Trich, Wet Prep: NONE SEEN
Yeast Wet Prep HPF POC: NONE SEEN

## 2016-05-10 LAB — POCT PREGNANCY, URINE: Preg Test, Ur: NEGATIVE

## 2016-05-10 MED ORDER — METRONIDAZOLE 500 MG PO TABS: 500.0000 mg | ORAL_TABLET | Freq: Two times a day (BID) | ORAL | 0 refills | Status: DC

## 2016-05-10 NOTE — MAU Note (Signed)
Pt presents to MAU with complaints of lower abdominal pain. Pt [facility] and was told she had a ruptured cyst and pt states they did not give her but one percocet and she is still having pain. Pt denies any vaginal bleeding reports clear vaginal discharge

## 2016-05-10 NOTE — MAU Provider Note (Signed)
History     CSN: [id number]  Arrival date and time: 05/10/16 1720   None     Chief Complaint  Patient presents with  . Abdominal Pain   HPI 31 yo [id number] presenting today for the evaluation of right sided lower abdominal pain present for the past week which radiates to her right side. Patient reports being [facility] on 05/06/2016. She was told that she had a ruptured ovarian cyst and sent her home. Patient is concerned that she may require antibiotics for treatment as her pain is still present. She denies any vaginal bleeding or abnormal discharge. She denies dysuria. Patient reports a history of irregular menstrual cycles, often having a period every 3 months. She is sexually active without contraception. Patient also reports an abnormal pap smear 3 months [facility] but was not able to afford the colposcopy.    Past Medical History:  Diagnosis Date  . Abnormal Pap smear   . Medical history non-contributory     Past Surgical History:  Procedure Laterality Date  . COLPOSCOPY    . NO PAST SURGERIES      Family History  Problem Relation Age of Onset  . Hypertension Father   . Diabetes Father     Social History  Substance Use Topics  . Smoking status: Current Every Day Smoker    Packs/day: 1.00  . Smokeless tobacco: Never Used  . Alcohol use Yes     Comment: occasionally    Allergies:  Allergies  Allergen Reactions  . Latex Rash    Prescriptions Prior to Admission  Medication Sig Dispense Refill Last Dose  . ibuprofen (ADVIL,MOTRIN) 800 MG tablet Take 1 tablet (800 mg total) by mouth every 8 (eight) hours as needed for mild pain or moderate pain. 15 tablet 0   . pseudoephedrine (SUDAFED) 60 MG tablet Take 1 tablet (60 mg total) by mouth every 6 (six) hours as needed for congestion. 30 tablet 0   . traMADol (ULTRAM) 50 MG tablet Take 1 tablet (50 mg total) by mouth every 6 (six) hours as needed. (Patient not  taking: Reported on 10/23/2014) 15 tablet 0 Not Taking at Unknown time    Review of Systems  See pertinent in HPI Physical Exam   Blood pressure 128/84, pulse 96, temperature 98.1 F (36.7 C), resp. rate 18, height 5' (1.524 m), weight 261 lb (118.4 kg), last menstrual period 04/23/2016.  Physical Exam GENERAL: Well-developed, well-nourished female in no acute distress.  LUNGS: Clear to auscultation bilaterally.  HEART: Regular rate and rhythm. ABDOMEN: Soft, nontender, nondistended. Obese PELVIC: Normal external female genitalia. Vagina is pink and rugated.  Normal discharge. Normal appearing cervix. Uterus is normal in size. No adnexal mass or tenderness. EXTREMITIES: No cyanosis, clubbing, or edema, 2+ distal pulses.  05/06/2016 ultrasound reviewed (patient brought records with her) 10 week size uterus without fibroids, cystic fluid collection within endometrial lining. Normal right and left ovaries without ovarian cysts. Small amount of free fluid  MAU Course  Procedures  MDM UA- negative Wet prep- positive clue cells GC/Cl- pending  Assessment and Plan  31 yo [id number] with pelvic pain - Reviewed ultrasound results with the patient and likely ruptured ovarian cyst as etiology of her pain given the free fluid. Advised patient to control her pain with ibuprofen and heating pad. Informed patient that it will take several weeks before her pain completely resolves - patient with BV- Rx flagyl provided - Patient will be  contacted with GC/CL results if abnormal - Contact information provided to [facility] to review recent pap smear and assess whether she is a [facility] candidate in order to have colposcopy - Precautions reviewed  Raha Tennison 05/10/2016, 6:38 PM

## 2016-05-10 NOTE — Discharge Instructions (Signed)
Ovarian Cyst  An ovarian cyst is a fluid-filled sac that forms on an ovary. The ovaries are small organs that produce eggs in women. Various types of cysts can form on the ovaries. Some may cause symptoms and require treatment. Most ovarian cysts go away on their own, are not cancerous (are benign), and do not cause problems. Common types of ovarian cysts include:  Functional (follicle) cysts.  Occur during the menstrual cycle, and usually go away with the next menstrual cycle if you do not get pregnant.  Usually cause no symptoms.  Endometriomas.  Are cysts that form from the tissue that lines the uterus (endometrium).  Are sometimes called "chocolate cysts" because they become filled with blood that turns brown.  Can cause pain in the lower abdomen during intercourse and during your period.  Cystadenoma cysts.  Develop from cells on the outside surface of the ovary.  Can get very large and cause lower abdomen pain and pain with intercourse.  Can cause severe pain if they twist or break open (rupture).  Dermoid cysts.  Are sometimes found in both ovaries.  May contain different kinds of body tissue, such as skin, teeth, hair, or cartilage.  Usually do not cause symptoms unless they get very big.  Theca lutein cysts.  Occur when too much of a certain hormone (human chorionic gonadotropin) is produced and overstimulates the ovaries to produce an egg.  Are most common after having procedures used to assist with the conception of a baby (in vitro fertilization). What are the causes? Ovarian cysts may be caused by:  Ovarian hyperstimulation syndrome. This is a condition that can develop from taking fertility medicines. It causes multiple large ovarian cysts to form.  Polycystic ovarian syndrome (PCOS). This is a common hormonal disorder that can cause ovarian cysts, as well as problems with your period or fertility. What increases the risk? The following factors may make you  more likely to develop ovarian cysts:  Being overweight or obese.  Taking fertility medicines.  Taking certain forms of hormonal birth control.  Smoking. What are the signs or symptoms? Many ovarian cysts do not cause symptoms. If symptoms are present, they may include:  Pelvic pain or pressure.  Pain in the lower abdomen.  Pain during sex.  Abdominal swelling.  Abnormal menstrual periods.  Increasing pain with menstrual periods. How is this diagnosed? These cysts are commonly found during a routine pelvic exam. You may have tests to find out more about the cyst, such as:  Ultrasound.  X-ray of the pelvis.  CT scan.  MRI.  Blood tests. How is this treated? Many ovarian cysts go away on their own without treatment. Your health care provider may want to check your cyst regularly for 2-3 months to see if it changes. If you are in menopause, it is especially important to have your cyst monitored closely because menopausal women have a higher rate of ovarian cancer. When treatment is needed, it may include:  Medicines to help relieve pain.  A procedure to drain the cyst (aspiration).  Surgery to remove the whole cyst.  Hormone treatment or birth control pills. These methods are sometimes used to help dissolve a cyst. Follow these instructions at home:  Take over-the-counter and prescription medicines only as told by your health care provider.  Do not drive or use heavy machinery while taking prescription pain medicine.  Get regular pelvic exams and Pap tests as often as told by your health care provider.  Return to your   normal activities as told by your health care provider. Ask your health care provider what activities are safe for you.  Do not use any products that contain nicotine or tobacco, such as cigarettes and e-cigarettes. If you need help quitting, ask your health care provider.  Keep all follow-up visits as told by your health care provider. This is  important. Contact a health care provider if:  Your periods are late, irregular, or painful, or they stop.  You have pelvic pain that does not go away.  You have pressure on your bladder or trouble emptying your bladder completely.  You have pain during sex.  You have any of the following in your abdomen:  A feeling of fullness.  Pressure.  Discomfort.  Pain that does not go away.  Swelling.  You feel generally ill.  You become constipated.  You lose your appetite.  You develop severe acne.  You start to have more body hair and facial hair.  You are gaining weight or losing weight without changing your exercise and eating habits.  You think you may be pregnant. Get help right away if:  You have abdominal pain that is severe or gets worse.  You cannot eat or drink without vomiting.  You suddenly develop a fever.  Your menstrual period is much heavier than usual. This information is not intended to replace advice given to you by your health care provider. Make sure you discuss any questions you have with your health care provider. Document Released: 04/06/2005 Document Revised: 10/25/2015 Document Reviewed: 09/08/2015 Elsevier Interactive Patient Education  2017 Elsevier Inc.  

## 2016-05-11 LAB — GC/CHLAMYDIA PROBE AMP (~~LOC~~) NOT AT ARMC
Chlamydia: NEGATIVE
Neisseria Gonorrhea: NEGATIVE

## 2016-05-29 ENCOUNTER — Encounter (HOSPITAL_COMMUNITY): Payer: Self-pay

## 2016-05-29 ENCOUNTER — Ambulatory Visit (HOSPITAL_COMMUNITY)
Admission: RE | Admit: 2016-05-29 | Discharge: 2016-05-29 | Disposition: A | Discharge status: Home / Self Care | Payer: Self-pay | Source: Ambulatory Visit | Attending: Obstetrics and Gynecology | Admitting: Obstetrics and Gynecology

## 2016-05-29 VITALS — BP 124/70 | Ht 63.0 in | Wt 266.0 lb

## 2016-05-29 DIAGNOSIS — Z01419 Encounter for gynecological examination (general) (routine) without abnormal findings: Secondary | ICD-10-CM

## 2016-05-29 NOTE — Progress Notes (Signed)
No complaints today.   Pap Smear: Pap smear completed today. [facility] and ASCUS with positive HPV. Patient did not follow up per recommendations for her abnormal Pap smear. It was recommended to have a colposcopy completed and due to being over six months since Pap smear was completed needed to do a repeat Pap smear. Per patient has a history of one other abnormal Pap smear in addition to her last Pap smear that a colposcopy was completed for follow up. Last Pap smear result is in EPIC.  Physical exam: Breasts Breasts symmetrical. No skin abnormalities bilateral breasts. No nipple retraction bilateral breasts. No nipple discharge bilateral breasts. No lymphadenopathy. No lumps palpated bilateral breasts. No complaints of pain or tenderness on exam. Screening mammogram recommended at age 31 unless clinically indicated prior.  Pelvic/Bimanual   Ext Genitalia No lesions, no swelling and no discharge observed on external genitalia.         Vagina Vagina pink and normal texture. No lesions or discharge observed in vagina.          Cervix Cervix is present. Cervix pink and of normal texture. No discharge observed.     Uterus Uterus is present and palpable. Uterus in normal position and normal size.        Adnexae Bilateral ovaries present and palpable. No tenderness on palpation.          Rectovaginal No rectal exam completed today since patient had no rectal complaints. No skin abnormalities observed on exam.    Smoking History: Patient is a current smoker. Discussed smoking cessation with patient. Referred [facility] and gave resources to free smoking cessation classes at [facility].  Patient Navigation: Patient education provided. Access to services provided for [facility] through [facility].

## 2016-05-29 NOTE — Patient Instructions (Addendum)
Explained breast self awareness with [employer] Yusupov. Let patient know that follow up from today's Pap smear will depend on the result. Let patient know will follow up with her within the next couple weeks with results of Pap smear by phone. Informed patient that she will need a screening mammogram at age 31 unless clinically indicated prior. Discussed smoking cessation with patient. Referred patient to the [facility] and gave resources to free smoking cessation classes at [facility]. Julie Hendrix verbalized understanding.  Akshith Moncus, Kathaleen Maserhristine Poll, RN 2:26 PM

## 2016-05-29 NOTE — Progress Notes (Signed)
Pap smear completed

## 2016-06-01 ENCOUNTER — Encounter (HOSPITAL_COMMUNITY): Payer: Self-pay | Admitting: *Deleted

## 2016-06-03 LAB — CYTOLOGY - PAP
Diagnosis: UNDETERMINED — AB
HPV: DETECTED — AB

## 2016-06-08 ENCOUNTER — Telehealth (HOSPITAL_COMMUNITY): Payer: Self-pay | Admitting: *Deleted

## 2016-06-08 NOTE — Telephone Encounter (Signed)
Telephoned patient at home number and unable to leave voicemail.

## 2016-06-10 ENCOUNTER — Telehealth (HOSPITAL_COMMUNITY): Payer: Self-pay | Admitting: *Deleted

## 2016-06-10 NOTE — Telephone Encounter (Signed)
[facility]. Advised patient of abnormal pap smear and positive HPV. Advised patient need for colposcopy due to abnormal pap smear. [facility] 3:00. Patient voiced understanding.

## 2016-06-22 ENCOUNTER — Other Ambulatory Visit (HOSPITAL_COMMUNITY)
Admission: RE | Admit: 2016-06-22 | Discharge: 2016-06-22 | Disposition: A | Discharge status: Home / Self Care | Payer: Self-pay | Source: Ambulatory Visit | Attending: Obstetrics & Gynecology | Admitting: Obstetrics & Gynecology

## 2016-06-22 ENCOUNTER — Encounter: Payer: Self-pay | Admitting: Obstetrics & Gynecology

## 2016-06-22 ENCOUNTER — Ambulatory Visit (INDEPENDENT_AMBULATORY_CARE_PROVIDER_SITE_OTHER): Payer: Medicaid Other | Admitting: Obstetrics & Gynecology

## 2016-06-22 ENCOUNTER — Telehealth: Payer: Self-pay

## 2016-06-22 VITALS — BP 151/97 | HR 81 | Wt 261.1 lb

## 2016-06-22 DIAGNOSIS — R87610 Atypical squamous cells of undetermined significance on cytologic smear of cervix (ASC-US): Secondary | ICD-10-CM | POA: Diagnosis not present

## 2016-06-22 DIAGNOSIS — Z309 Encounter for contraceptive management, unspecified: Secondary | ICD-10-CM | POA: Diagnosis present

## 2016-06-22 DIAGNOSIS — R87810 Cervical high risk human papillomavirus (HPV) DNA test positive: Secondary | ICD-10-CM | POA: Insufficient documentation

## 2016-06-22 DIAGNOSIS — Z3202 Encounter for pregnancy test, result negative: Secondary | ICD-10-CM

## 2016-06-22 LAB — POCT PREGNANCY, URINE: Preg Test, Ur: NEGATIVE

## 2016-06-22 NOTE — Progress Notes (Signed)
   Subjective:    Patient ID: Julie Hendrix, female    DOB: 18-Apr-1986, 31 y.o.   MRN: [id number]  HPI 31 yo S AA P2 here for a colpo due to a ASCUS pap. She has a distant h/o abnormal paps.  She is interested in learning about forms of contraception. She is now using condoms and has used depo provera, nuvaring in the past.   Review of Systems     Objective:   Physical Exam Pleasant morbidly obese BFNAD Breathing, conversing, and ambulating normally UPT negative, consent signed, time out done Parous cervix noted Cervix prepped with acetic acid. Transformation zone seen in its entirety. Colpo adequate. Normal colpo ECC obtained. She tolerated the procedure well.      Assessment & Plan:   ASCUS + HR HPV pap and normal colpo If ECC is negative, then pap in a year with cotesting

## 2016-06-22 NOTE — Telephone Encounter (Signed)
Patient called stating she lost her [facility] card and has a colposcopy today. Informed her to inform them she is a [facility] patient when she checks in and if they have any questions or concerns they can call Fon[facility] [place].

## 2016-06-29 ENCOUNTER — Telehealth: Payer: Self-pay | Admitting: General Practice

## 2016-06-29 NOTE — Telephone Encounter (Signed)
Per Dr Marice Potterove, patient needs pap with cotesting in 1 year. Called patient & informed her of results & recommendations. Patient verbalized understanding to all & had no questions

## 2016-07-10 ENCOUNTER — Ambulatory Visit: Payer: Self-pay | Admitting: Obstetrics & Gynecology

## 2016-07-16 ENCOUNTER — Ambulatory Visit: Payer: Self-pay | Admitting: Obstetrics & Gynecology

## 2016-09-02 ENCOUNTER — Inpatient Hospital Stay (HOSPITAL_COMMUNITY)
Admission: AD | Admit: 2016-09-02 | Discharge: 2016-09-02 | Disposition: A | Discharge status: Home / Self Care | Payer: Medicaid Other | Source: Ambulatory Visit | Attending: Obstetrics and Gynecology | Admitting: Obstetrics and Gynecology

## 2016-09-02 ENCOUNTER — Encounter (HOSPITAL_COMMUNITY): Payer: Self-pay | Admitting: *Deleted

## 2016-09-02 DIAGNOSIS — R109 Unspecified abdominal pain: Secondary | ICD-10-CM

## 2016-09-02 DIAGNOSIS — Z8249 Family history of ischemic heart disease and other diseases of the circulatory system: Secondary | ICD-10-CM | POA: Insufficient documentation

## 2016-09-02 DIAGNOSIS — R197 Diarrhea, unspecified: Secondary | ICD-10-CM | POA: Diagnosis not present

## 2016-09-02 DIAGNOSIS — Z91040 Latex allergy status: Secondary | ICD-10-CM | POA: Diagnosis not present

## 2016-09-02 DIAGNOSIS — I10 Essential (primary) hypertension: Secondary | ICD-10-CM | POA: Insufficient documentation

## 2016-09-02 DIAGNOSIS — N760 Acute vaginitis: Secondary | ICD-10-CM | POA: Insufficient documentation

## 2016-09-02 DIAGNOSIS — B9689 Other specified bacterial agents as the cause of diseases classified elsewhere: Secondary | ICD-10-CM | POA: Insufficient documentation

## 2016-09-02 DIAGNOSIS — F17210 Nicotine dependence, cigarettes, uncomplicated: Secondary | ICD-10-CM | POA: Insufficient documentation

## 2016-09-02 DIAGNOSIS — Z833 Family history of diabetes mellitus: Secondary | ICD-10-CM | POA: Insufficient documentation

## 2016-09-02 DIAGNOSIS — K219 Gastro-esophageal reflux disease without esophagitis: Secondary | ICD-10-CM | POA: Insufficient documentation

## 2016-09-02 HISTORY — DX: Anogenital (venereal) warts: A63.0

## 2016-09-02 LAB — URINALYSIS, ROUTINE W REFLEX MICROSCOPIC
Bilirubin Urine: NEGATIVE
GLUCOSE, UA: NEGATIVE mg/dL
Hgb urine dipstick: NEGATIVE
Ketones, ur: NEGATIVE mg/dL
Leukocytes, UA: NEGATIVE
NITRITE: NEGATIVE
PH: 5 (ref 5.0–8.0)
Protein, ur: NEGATIVE mg/dL
Specific Gravity, Urine: 1.019 (ref 1.005–1.030)

## 2016-09-02 LAB — POCT PREGNANCY, URINE: Preg Test, Ur: NEGATIVE

## 2016-09-02 LAB — CBC
HCT: 37 % (ref 36.0–46.0)
HEMOGLOBIN: 12.1 g/dL (ref 12.0–15.0)
MCH: 26.4 pg (ref 26.0–34.0)
MCHC: 32.7 g/dL (ref 30.0–36.0)
MCV: 80.6 fL (ref 78.0–100.0)
PLATELETS: 245 10*3/uL (ref 150–400)
RBC: 4.59 MIL/uL (ref 3.87–5.11)
RDW: 16.3 % — ABNORMAL HIGH (ref 11.5–15.5)
WBC: 10.1 10*3/uL (ref 4.0–10.5)

## 2016-09-02 LAB — WET PREP, GENITAL
SPERM: NONE SEEN
Trich, Wet Prep: NONE SEEN
YEAST WET PREP: NONE SEEN

## 2016-09-02 MED ORDER — RANITIDINE HCL 150 MG PO TABS: 150.0000 mg | ORAL_TABLET | Freq: Two times a day (BID) | ORAL | 0 refills | Status: DC

## 2016-09-02 MED ORDER — METRONIDAZOLE 500 MG PO TABS: 500.0000 mg | ORAL_TABLET | Freq: Two times a day (BID) | ORAL | 0 refills | Status: DC

## 2016-09-02 NOTE — Progress Notes (Signed)
Swabs collected without speculum

## 2016-09-02 NOTE — MAU Note (Signed)
Stomach pain mid to lower abd that is crampy and achy. Was told had a cyst and pain is in same spot. Stomach feels like it is "balling up" for wk or 2. Denies vag bleeding. Some white d/c with slight odor.

## 2016-09-02 NOTE — Discharge Instructions (Signed)
Diarrhea, Adult Diarrhea is when you have loose and water poop (stool) often. Diarrhea can make you feel weak and cause you to get dehydrated. Dehydration can make you tired and thirsty, make you have a dry mouth, and make it so you pee (urinate) less often. Diarrhea often lasts 2-3 days. However, it can last longer if it is a sign of something more serious. It is important to treat your diarrhea as told by your doctor. Follow these instructions at home: Eating and drinking   Follow these recommendations as told by your doctor:  Take an oral rehydration solution (ORS). This is a drink that is sold at pharmacies and stores.  Drink clear fluids, such as:  Water.  Ice chips.  Diluted fruit juice.  Low-calorie sports drinks.  Eat bland, easy-to-digest foods in small amounts as you are able. These foods include:  Bananas.  Applesauce.  Rice.  Low-fat (lean) meats.  Toast.  Crackers.  Avoid drinking fluids that have a lot of sugar or caffeine in them.  Avoid alcohol.  Avoid spicy or fatty foods. General instructions    Drink enough fluid to keep your pee (urine) clear or pale yellow.  Wash your hands often. If you cannot use soap and water, use hand sanitizer.  Make sure that all people in your home wash their hands well and often.  Take over-the-counter and prescription medicines only as told by your doctor.  Rest at home while you get better.  Watch your condition for any changes.  Take a warm bath to help with any burning or pain from having diarrhea.  Keep all follow-up visits as told by your doctor. This is important. Contact a doctor if:  You have a fever.  Your diarrhea gets worse.  You have new symptoms.  You cannot keep fluids down.  You feel light-headed or dizzy.  You have a headache.  You have muscle cramps. Get help right away if:  You have chest pain.  You feel very weak or you pass out (faint).  You have bloody or black poop or  poop that look like tar.  You have very bad pain, cramping, or bloating in your belly (abdomen).  You have trouble breathing or you are breathing very quickly.  Your heart is beating very quickly.  Your skin feels cold and clammy.  You feel confused.  You have signs of dehydration, such as:  Dark pee, hardly any pee, or no pee.  Cracked lips.  Dry mouth.  Sunken eyes.  Sleepiness.  Weakness. This information is not intended to replace advice given to you by your health care provider. Make sure you discuss any questions you have with your health care provider. Document Released: 09/23/2007 Document Revised: 10/25/2015 Document Reviewed: 12/11/2014 Elsevier Interactive Patient Education  2017 Elsevier Inc. [place] Diet A bland diet consists of foods that do not have a lot of fat or fiber. Foods without fat or fiber are easier for the body to digest. They are also less likely to irritate your mouth, throat, stomach, and other parts of your gastrointestinal tract. A bland diet is sometimes called a BRAT diet. What is my plan? Your health care provider or dietitian may recommend specific changes to your diet to prevent and treat your symptoms, such as:  Eating small meals often.  Cooking food until it is soft enough to chew easily.  Chewing your food well.  Drinking fluids slowly.  Not eating foods that are very spicy, sour, or fatty.  Not  eating citrus fruits, such as oranges and grapefruit. What do I need to know about this diet?  Eat a variety of foods from the bland diet food list.  Do not follow a bland diet longer than you have to.  Ask your health care provider whether you should take vitamins. What foods can I eat? Grains   Hot cereals, such as cream of wheat. Bread, crackers, or tortillas made from refined white flour. Rice. Vegetables  Canned or cooked vegetables. Mashed or boiled potatoes. Fruits  Bananas. Applesauce. Other types of cooked or canned  fruit with the skin and seeds removed, such as canned peaches or pears. Meats and Other Protein Sources  Scrambled eggs. Creamy peanut butter or other nut butters. Lean, well-cooked meats, such as chicken or fish. Tofu. Soups or broths. Dairy  Low-fat dairy products, such as milk, cottage cheese, or yogurt. Beverages  Water. Herbal tea. Apple juice. Sweets and Desserts  Pudding. Custard. Fruit gelatin. Ice cream. Fats and Oils  Mild salad dressings. Canola or olive oil. The items listed above may not be a complete list of allowed foods or beverages. Contact your dietitian for more options.  What foods are not recommended? Foods and ingredients that are often not recommended include:  Spicy foods, such as hot sauce or salsa.  Fried foods.  Sour foods, such as pickled or fermented foods.  Raw vegetables or fruits, especially citrus or berries.  Caffeinated drinks.  Alcohol.  Strongly flavored seasonings or condiments. The items listed above may not be a complete list of foods and beverages that are not allowed. Contact your dietitian for more information.  This information is not intended to replace advice given to you by your health care provider. Make sure you discuss any questions you have with your health care provider. Document Released: 07/29/2015 Document Revised: 09/12/2015 Document Reviewed: 04/18/2014 Elsevier Interactive Patient Education  2017 [employer]

## 2016-09-02 NOTE — Progress Notes (Signed)
Bimanual only done with no cervical tenderness

## 2016-09-02 NOTE — Progress Notes (Signed)
esig not working so pt Multimedia programmersigned hardcopy and put on chart

## 2016-09-02 NOTE — MAU Provider Note (Signed)
History     CSN: [id number]  Arrival date and time: 09/02/16 1324   First Provider Initiated Contact with Patient 09/02/16 1517      Chief Complaint  Patient presents with  . Abdominal Pain   HPI   [name] is a 31 y.o. female [phone] non-pregnant here with abdominal pain and diarrhea. She has had 3-4 episodes of watery/loose stools. She starts with cramping in her in abdomen and then has to have a BM. The diarrhea/loose stool has been present for 1 week.  1-2 weeks ago pain started in her upper and lower abdomen. The pain is cramp like. She has not tried anything over the counter.  She just recently switched to a healthy diet and has increased protein and vegetables.   OB History    Gravida Para Term Preterm AB Living   3 2 2  0 1 2   SAB TAB Ectopic Multiple Live Births   0 1 0 0 2      Past Medical History:  Diagnosis Date  . Abnormal Pap smear   . HPV (human papilloma virus) anogenital infection   . Hypertension     Past Surgical History:  Procedure Laterality Date  . COLPOSCOPY    . NO PAST SURGERIES      Family History  Problem Relation Age of Onset  . Hypertension Father   . Diabetes Father   . Hypertension Mother     Social History  Substance Use Topics  . Smoking status: Current Every Day Smoker    Packs/day: 1.00  . Smokeless tobacco: Never Used  . Alcohol use Yes     Comment: occasionally    Allergies:  Allergies  Allergen Reactions  . Latex Rash    No prescriptions prior to admission.   Results for orders placed or performed during the hospital encounter of 09/02/16 (from the past 48 hour(s))  Urinalysis, Routine w reflex microscopic     Status: None   Collection Time: 09/02/16  2:10 PM  Result Value Ref Range   Color, Urine YELLOW YELLOW   APPearance CLEAR CLEAR   Specific Gravity, Urine 1.019 1.005 - 1.030   pH 5.0 5.0 - 8.0   Glucose, UA NEGATIVE NEGATIVE mg/dL   Hgb urine dipstick NEGATIVE NEGATIVE   Bilirubin Urine  NEGATIVE NEGATIVE   Ketones, ur NEGATIVE NEGATIVE mg/dL   Protein, ur NEGATIVE NEGATIVE mg/dL   Nitrite NEGATIVE NEGATIVE   Leukocytes, UA NEGATIVE NEGATIVE  Pregnancy, urine POC     Status: None   Collection Time: 09/02/16  2:41 PM  Result Value Ref Range   Preg Test, Ur NEGATIVE NEGATIVE    Comment:        THE SENSITIVITY OF THIS METHODOLOGY IS >24 mIU/mL     Review of Systems  Constitutional: Negative for fever.  Gastrointestinal: Positive for diarrhea. Negative for nausea and vomiting.   Physical Exam   Blood pressure 133/72, pulse 77, temperature 98.3 F (36.8 C), resp. rate 18, height 5\' 3"  (1.6 m), weight 270 lb (122.5 kg), last menstrual period 08/07/2016.  Physical Exam  Constitutional: She is oriented to person, place, and time. She appears well-developed and well-nourished. No distress.  HENT:  Head: Normocephalic.  GI: Soft. She exhibits no distension. There is no tenderness. There is no rebound.  Genitourinary:  Genitourinary Comments: Wet prep and GC collected without speculum.   Musculoskeletal: Normal range of motion.  Neurological: She is alert and oriented to person, place, and time.  Skin: Skin  is warm. She is not diaphoretic.  Psychiatric: Her behavior is normal.   MAU Course  Procedures  None  MDM  Urine pregnancy test negative  UA CBC  Unable to collect stool sample in MAU: patient unable to collect  Assessment and Plan   A:  1. Abdominal cramping   2. Diarrhea, unspecified type   3. Gastroesophageal reflux disease, esophagitis presence not specified   4. BV (bacterial vaginosis)     P:  Discharge home in stable condition Rx: Flagyl, Zantac Follow up with PCP, multiple contact numbers given to establish care Ok to use imodium over the counter Return to MAU for OB emergencies only  Venia Carbonasch, Lakashia Collison I, NP 09/04/2016 6:33 AM

## 2016-09-02 NOTE — Progress Notes (Signed)
Julie CarbonJennifer Rasch NP in to discuss test results and d/c plan. Written and verbal d/c instructions will be given when printed. Pt voices understanding

## 2016-09-03 LAB — GC/CHLAMYDIA PROBE AMP (~~LOC~~) NOT AT ARMC
Chlamydia: NEGATIVE
Neisseria Gonorrhea: NEGATIVE

## 2016-09-14 ENCOUNTER — Encounter (HOSPITAL_COMMUNITY): Payer: Self-pay | Admitting: *Deleted

## 2016-09-14 ENCOUNTER — Ambulatory Visit (HOSPITAL_COMMUNITY)
Admission: EM | Admit: 2016-09-14 | Discharge: 2016-09-14 | Disposition: A | Discharge status: Home / Self Care | Payer: Medicaid Other | Attending: Family Medicine | Admitting: Family Medicine

## 2016-09-14 DIAGNOSIS — R609 Edema, unspecified: Secondary | ICD-10-CM

## 2016-09-14 DIAGNOSIS — M79662 Pain in left lower leg: Secondary | ICD-10-CM

## 2016-09-14 MED ORDER — TRIAMTERENE-HCTZ 37.5-25 MG PO TABS: 1.0000 | ORAL_TABLET | Freq: Every day | ORAL | 0 refills | Status: DC

## 2016-09-14 MED ORDER — INDOMETHACIN 50 MG PO CAPS: 50.0000 mg | ORAL_CAPSULE | Freq: Two times a day (BID) | ORAL | 0 refills | Status: DC

## 2016-09-14 NOTE — ED Triage Notes (Signed)
Reports waking up 2 days ago with left calf pain and bilat ankle swelling.  Today having "spasms" in left calf.  Denies injury.  Ambulating without difficulty.

## 2016-09-14 NOTE — ED Provider Notes (Signed)
[facility]    CSN: [id number] Arrival date & time: 09/14/16  1434     History   Chief Complaint Chief Complaint  Patient presents with  . Leg Pain    HPI Julie Hendrix is a 31 y.o. female.   Reports waking up 2 days ago with left calf pain and bilat ankle swelling.  Today having "spasms" in left calf.  Denies injury.  Ambulating without difficulty.  Patient had a sore calves on Saturday when she woke up and felt like she had a cramp in it. The pain seemed to resolve and then came back again this morning.  Patient been drinking a little bit more over the weekend. She does not take birth control pills nor does she smoke.      Past Medical History:  Diagnosis Date  . Abnormal Pap smear   . HPV (human papilloma virus) anogenital infection     Patient Active Problem List   Diagnosis Date Noted  . Breast lump on right side at 10 o'clock position 01/31/2013  . Abnormal uterine bleeding 09/08/2012    Past Surgical History:  Procedure Laterality Date  . COLPOSCOPY    . NO PAST SURGERIES      OB History    Gravida Para Term Preterm AB Living   3 2 2  0 1 2   SAB TAB Ectopic Multiple Live Births   0 1 0 0 2       Home Medications    Prior to Admission medications   Medication Sig Start Date End Date Taking? Authorizing Provider  indomethacin (INDOCIN) 50 MG capsule Take 1 capsule (50 mg total) by mouth 2 (two) times daily with a meal. 09/14/16   Elvina SidleLauenstein, Bracy Pepper, MD  metroNIDAZOLE (FLAGYL) 500 MG tablet Take 1 tablet (500 mg total) by mouth 2 (two) times daily. 09/02/16   Rasch, Victorino DikeJennifer I, NP  ranitidine (ZANTAC) 150 MG tablet Take 1 tablet (150 mg total) by mouth 2 (two) times daily. 09/02/16   Rasch, Victorino DikeJennifer I, NP  triamterene-hydrochlorothiazide (MAXZIDE-25) 37.5-25 MG tablet Take 1 tablet by mouth daily. 09/14/16   Elvina SidleLauenstein, Reuben Knoblock, MD    Family History Family History  Problem Relation Age of Onset  . Hypertension Father   . Diabetes Father   .  Hypertension Mother     Social History Social History  Substance Use Topics  . Smoking status: Current Every Day Smoker    Packs/day: 1.00  . Smokeless tobacco: Never Used  . Alcohol use Yes     Comment: occasionally     Allergies   Latex   Review of Systems Review of Systems  Cardiovascular: Positive for leg swelling.  Musculoskeletal: Positive for myalgias.  All other systems reviewed and are negative.    Physical Exam Triage Vital Signs ED Triage Vitals  Enc Vitals Group     BP 09/14/16 1520 (!) 140/56     Pulse Rate 09/14/16 1520 67     Resp 09/14/16 1520 14     Temp 09/14/16 1520 97.9 F (36.6 C)     Temp Source 09/14/16 1520 Oral     SpO2 09/14/16 1520 96 %     Weight --      Height --      Head Circumference --      Peak Flow --      Pain Score 09/14/16 1521 7     Pain Loc --      Pain Edu? --  Excl. in GC? --    No data found.   Updated Vital Signs BP (!) 140/56   Pulse 67   Temp 97.9 F (36.6 C) (Oral)   Resp 14   LMP 09/13/2016 (Exact Date)   SpO2 96%    Physical Exam  Constitutional: She is oriented to person, place, and time. She appears well-developed and well-nourished.  HENT:  Right Ear: External ear normal.  Left Ear: External ear normal.  Mouth/Throat: Oropharynx is clear and moist.  Eyes: Conjunctivae and EOM are normal. Pupils are equal, round, and reactive to light.  Neck: Normal range of motion. Neck supple.  Cardiovascular: Intact distal pulses.   Pulmonary/Chest: Effort normal.  Musculoskeletal: Normal range of motion. She exhibits edema. She exhibits no tenderness.  Patient has trace edema in both feet and ankles. I palpated her entire left leg and did not find any tenderness.  Neurological: She is alert and oriented to person, place, and time.  Skin: Skin is warm and dry.  Nursing note and vitals reviewed.    UC Treatments / Results  Labs (all labs ordered are listed, but only abnormal results are  displayed) Labs Reviewed - No data to display  EKG  EKG Interpretation None       Radiology No results found.  Procedures Procedures (including critical care time)  Medications Ordered in UC Medications - No data to display   Initial Impression / Assessment and Plan / UC Course  I have reviewed the triage vital signs and the nursing notes.  Pertinent labs & imaging results that were available during my care of the patient were reviewed by me and considered in my medical decision making (see chart for details).     Final Clinical Impressions(s) / UC Diagnoses   Final diagnoses:  Peripheral edema  Pain of left calf    New Prescriptions New Prescriptions   INDOMETHACIN (INDOCIN) 50 MG CAPSULE    Take 1 capsule (50 mg total) by mouth 2 (two) times daily with a meal.   TRIAMTERENE-HYDROCHLOROTHIAZIDE (MAXZIDE-25) 37.5-25 MG TABLET    Take 1 tablet by mouth daily.     Elvina Sidle, MD 09/14/16 1538

## 2016-09-14 NOTE — Discharge Instructions (Signed)
Please return here if your pain is worsening or the swelling is getting more obvious.

## 2016-10-26 DIAGNOSIS — R109 Unspecified abdominal pain: Secondary | ICD-10-CM | POA: Insufficient documentation

## 2016-10-26 DIAGNOSIS — Z5321 Procedure and treatment not carried out due to patient leaving prior to being seen by health care provider: Secondary | ICD-10-CM | POA: Insufficient documentation

## 2016-10-27 ENCOUNTER — Ambulatory Visit (HOSPITAL_COMMUNITY)
Admission: EM | Admit: 2016-10-27 | Discharge: 2016-10-27 | Disposition: A | Discharge status: Home / Self Care | Payer: Medicaid Other | Attending: Family Medicine | Admitting: Family Medicine

## 2016-10-27 ENCOUNTER — Emergency Department (HOSPITAL_COMMUNITY)
Admission: EM | Admit: 2016-10-27 | Discharge: 2016-10-27 | Disposition: A | Discharge status: Home / Self Care | Payer: Self-pay | Attending: Emergency Medicine | Admitting: Emergency Medicine

## 2016-10-27 ENCOUNTER — Encounter (HOSPITAL_COMMUNITY): Payer: Self-pay | Admitting: Emergency Medicine

## 2016-10-27 ENCOUNTER — Emergency Department (HOSPITAL_COMMUNITY): Payer: Self-pay

## 2016-10-27 DIAGNOSIS — M62838 Other muscle spasm: Secondary | ICD-10-CM

## 2016-10-27 LAB — BASIC METABOLIC PANEL
ANION GAP: 8 (ref 5–15)
BUN: 14 mg/dL (ref 6–20)
CALCIUM: 8.7 mg/dL — AB (ref 8.9–10.3)
CO2: 23 mmol/L (ref 22–32)
Chloride: 106 mmol/L (ref 101–111)
Creatinine, Ser: 0.68 mg/dL (ref 0.44–1.00)
Glucose, Bld: 108 mg/dL — ABNORMAL HIGH (ref 65–99)
POTASSIUM: 3.7 mmol/L (ref 3.5–5.1)
SODIUM: 137 mmol/L (ref 135–145)

## 2016-10-27 LAB — CBC
HEMATOCRIT: 38 % (ref 36.0–46.0)
HEMOGLOBIN: 12.6 g/dL (ref 12.0–15.0)
MCH: 27.1 pg (ref 26.0–34.0)
MCHC: 33.2 g/dL (ref 30.0–36.0)
MCV: 81.7 fL (ref 78.0–100.0)
Platelets: 232 10*3/uL (ref 150–400)
RBC: 4.65 MIL/uL (ref 3.87–5.11)
RDW: 16.2 % — ABNORMAL HIGH (ref 11.5–15.5)
WBC: 12.5 10*3/uL — ABNORMAL HIGH (ref 4.0–10.5)

## 2016-10-27 LAB — POCT I-STAT TROPONIN I: TROPONIN I, POC: 0 ng/mL (ref 0.00–0.08)

## 2016-10-27 MED ORDER — DICLOFENAC SODIUM 75 MG PO TBEC
75.0000 mg | DELAYED_RELEASE_TABLET | Freq: Two times a day (BID) | ORAL | 0 refills | Status: DC
Start: 2016-10-27 — End: 2017-01-21

## 2016-10-27 MED ORDER — KETOROLAC TROMETHAMINE 30 MG/ML IJ SOLN
30.0000 mg | Freq: Once | INTRAMUSCULAR | Status: AC
  Administered 2016-10-27: 30 mg via INTRAMUSCULAR

## 2016-10-27 MED ORDER — CYCLOBENZAPRINE HCL 10 MG PO TABS: 10.0000 mg | ORAL_TABLET | Freq: Two times a day (BID) | ORAL | 0 refills | Status: DC | PRN

## 2016-10-27 MED ORDER — KETOROLAC TROMETHAMINE 30 MG/ML IJ SOLN
INTRAMUSCULAR | Status: AC
  Filled 2016-10-27: qty 1

## 2016-10-27 NOTE — ED Notes (Signed)
Pt stated to writer that she is leaving and she's tired.  Writer notified EDP

## 2016-10-27 NOTE — ED Triage Notes (Signed)
Pt states she is leaving and will come back tomorrow but she is not waiting any more tonight

## 2016-10-27 NOTE — Discharge Instructions (Signed)
Your laboratory work and imaging from your trip to the emergency room last night were all normal with no remarkable findings.  You most likely have a muscle spasm. I have prescribed two medicines for your pain. The first is diclofenac, take 1 tablet twice a day and the other is Flexeril, take 1 tablet twice a day. Flexeril may cause drowsiness so do not drive until you know how this medicine affects you. Also do not drink any alcohol either. You may apply ice and alternate with heat for 15 minutes at a time 4 times daily and for additional pain control you may take tylenol over the counter ever 4 hours but do not take more than 4000 mg a day. Should your pain continue or fail to resolve, follow up with your primary care provider or return to clinic as needed.

## 2016-10-27 NOTE — ED Triage Notes (Signed)
Patient has lower back pain.  Patient had pain in right side of torso. Patient has had pain for 4 days.  Pain has moved to include all of her back.  Patient went to [facility] last night and had several tests, but left prior to being seen.

## 2016-10-27 NOTE — ED Triage Notes (Signed)
Pt states she is having chest pain that goes into her back and under her shoulder blade on the right side  Pt states it started yesterday Pt states sometimes it is hard to breathe

## 2016-10-27 NOTE — ED Provider Notes (Signed)
CSN: [id number]     Arrival date & time 10/27/16  1847 History   None    Chief Complaint  Patient presents with  . Back Pain   (Consider location/radiation/quality/duration/timing/severity/associated sxs/prior Treatment) The history is provided by the patient.  Back Pain  Location:  Thoracic spine Quality:  Aching and cramping Radiates to:  Does not radiate Pain severity:  Moderate Pain is:  Same all the time Onset quality:  Gradual Duration:  3 days Timing:  Constant Progression:  Worsening Chronicity:  New Context: lifting heavy objects   Relieved by:  Lying down Worsened by:  Bending, twisting and touching Ineffective treatments:  None tried Associated symptoms: no abdominal pain, no bladder incontinence, no bowel incontinence, no fever, no headaches, no numbness, no paresthesias, no tingling and no weight loss     Past Medical History:  Diagnosis Date  . Abnormal Pap smear   . HPV (human papilloma virus) anogenital infection    Past Surgical History:  Procedure Laterality Date  . COLPOSCOPY    . NO PAST SURGERIES     Family History  Problem Relation Age of Onset  . Hypertension Father   . Diabetes Father   . Heart attack Father   . Hypertension Mother    Social History  Substance Use Topics  . Smoking status: Current Every Day Smoker    Packs/day: 1.00  . Smokeless tobacco: Never Used  . Alcohol use Yes     Comment: occasionally   OB History    Gravida Para Term Preterm AB Living   3 2 2  0 1 2   SAB TAB Ectopic Multiple Live Births   0 1 0 0 2     Review of Systems  Constitutional: Negative for chills, fever and weight loss.  HENT: Negative.   Respiratory: Negative.   Cardiovascular: Negative.   Gastrointestinal: Negative for abdominal pain, bowel incontinence, diarrhea and nausea.  Genitourinary: Negative for bladder incontinence.  Musculoskeletal: Positive for back pain.  Skin: Negative.   Neurological: Negative for tingling, numbness,  headaches and paresthesias.    Allergies  Latex  Home Medications   Prior to Admission medications   Medication Sig Start Date End Date Taking? Authorizing Provider  cyclobenzaprine (FLEXERIL) 10 MG tablet Take 1 tablet (10 mg total) by mouth 2 (two) times daily as needed for muscle spasms. 10/27/16   Dorena Bodo, NP  diclofenac (VOLTAREN) 75 MG EC tablet Take 1 tablet (75 mg total) by mouth 2 (two) times daily. 10/27/16   Dorena Bodo, NP   Meds Ordered and Administered this Visit   Medications  ketorolac (TORADOL) 30 MG/ML injection 30 mg (30 mg Intramuscular Given 10/27/16 2025)    BP 127/67 (BP Location: Right Arm)   Pulse 81   Temp 98.4 F (36.9 C) (Oral)   Resp 16   LMP 10/02/2016 (Exact Date)   SpO2 100%  No data found.   Physical Exam  Constitutional: She is oriented to person, place, and time. She appears well-developed and well-nourished. No distress.  HENT:  Head: Normocephalic and atraumatic.  Right Ear: External ear normal.  Left Ear: External ear normal.  Eyes: Conjunctivae are normal.  Neck: Normal range of motion.  Cardiovascular: Normal rate and regular rhythm.   Pulmonary/Chest: Effort normal and breath sounds normal.  Musculoskeletal:       Thoracic back: She exhibits tenderness and spasm. She exhibits normal range of motion and no bony tenderness.  Neurological: She is alert and oriented to person, place,  and time.  Skin: Skin is warm and dry. Capillary refill takes less than 2 seconds. She is not diaphoretic.  Nursing note and vitals reviewed.   Urgent Care Course     Procedures (including critical care time)  Labs Review Labs Reviewed - No data to display  Imaging Review Dg Chest 2 View  Result Date: 10/27/2016 CLINICAL DATA:  Chest pain and pressure.  Upper back pain. EXAM: CHEST  2 VIEW COMPARISON:  10/24/2013 FINDINGS: The cardiomediastinal contours are normal. Heart is at the upper limits normal in size. The lungs are clear.  Pulmonary vasculature is normal. No consolidation, pleural effusion, or pneumothorax. No acute osseous abnormalities are seen. IMPRESSION: No active cardiopulmonary disease. Electronically Signed   By: Rubye OaksMelanie  Ehinger M.D.   On: 10/27/2016 00:34      MDM   1. Muscle spasm     Follow up with PCP if pain persists    Dorena BodoKennard, Deaunte Dente, NP 10/27/16 2148

## 2017-01-21 ENCOUNTER — Ambulatory Visit (HOSPITAL_COMMUNITY)
Admission: EM | Admit: 2017-01-21 | Discharge: 2017-01-21 | Disposition: A | Discharge status: Home / Self Care | Payer: Self-pay | Attending: Family Medicine | Admitting: Family Medicine

## 2017-01-21 ENCOUNTER — Encounter (HOSPITAL_COMMUNITY): Payer: Self-pay | Admitting: Emergency Medicine

## 2017-01-21 DIAGNOSIS — S29019A Strain of muscle and tendon of unspecified wall of thorax, initial encounter: Secondary | ICD-10-CM

## 2017-01-21 LAB — POCT URINALYSIS DIP (DEVICE)
BILIRUBIN URINE: NEGATIVE
GLUCOSE, UA: NEGATIVE mg/dL
Ketones, ur: NEGATIVE mg/dL
NITRITE: NEGATIVE
PROTEIN: NEGATIVE mg/dL
SPECIFIC GRAVITY, URINE: 1.02 (ref 1.005–1.030)
Urobilinogen, UA: 0.2 mg/dL (ref 0.0–1.0)
pH: 6 (ref 5.0–8.0)

## 2017-01-21 LAB — POCT PREGNANCY, URINE: PREG TEST UR: NEGATIVE

## 2017-01-21 NOTE — ED Provider Notes (Signed)
[facility]    CSN: [id number] Arrival date & time: 01/21/17  1324     History   Chief Complaint Chief Complaint  Patient presents with  . possible uti    HPI Julie Hendrix is a 31 y.o. female.   31 year old obese female complaining of pain in the left para thoracic musculature. There is exacerbated with elicited with twisting or rotation of the torso left and right and sometimes with bending forward or moving the left arm forward and backward. Denies trauma. She states that this occurred 1-2 weeks ago and just prior to that she had been doing some repetitive work moving from one side to the other.patient thought might be due to her kidney however she is not having any urinary symptoms and the pain is not located over the kidney.she started her menses today, first noticed when she collected her urine  Rather complaint was that of left inguinal pain for 3 weeks. She has called her gynecologist at [facility] for an appointment for evaluation of that pain as well as a vaginal discharge and prefers to have this managed at her upcoming appointment.      Past Medical History:  Diagnosis Date  . Abnormal Pap smear   . HPV (human papilloma virus) anogenital infection     Patient Active Problem List   Diagnosis Date Noted  . Breast lump on right side at 10 o'clock position 01/31/2013  . Abnormal uterine bleeding 09/08/2012    Past Surgical History:  Procedure Laterality Date  . COLPOSCOPY    . NO PAST SURGERIES      OB History    Gravida Para Term Preterm AB Living   0 1 2   SAB TAB Ectopic Multiple Live Births   0 1 0 0 2       Home Medications    Prior to Admission medications   Medication Sig Start Date End Date Taking? Authorizing Provider  cyclobenzaprine (FLEXERIL) 10 MG tablet Take 1 tablet (10 mg total) by mouth 2 (two) times daily as needed for muscle spasms. 10/27/16   Dorena Bodo, NP    Family History Family History    Problem Relation Age of Onset  . Hypertension Father   . Diabetes Father   . Heart attack Father   . Hypertension Mother     Social History Social History  Substance Use Topics  . Smoking status: Current Every Day Smoker    Packs/day: 1.00  . Smokeless tobacco: Never Used  . Alcohol use Yes     Comment: occasionally     Allergies   Latex   Review of Systems Review of Systems  Constitutional: Negative.  Negative for activity change, chills and fever.  HENT: Negative.   Respiratory: Negative.   Cardiovascular: Negative.   Genitourinary: Positive for vaginal discharge. Negative for dysuria and frequency.  Musculoskeletal: Positive for back pain.       As per HPI  Skin: Negative for color change, pallor and rash.  Neurological: Negative.   All other systems reviewed and are negative.    Physical Exam Triage Vital Signs ED Triage Vitals  Enc Vitals Group     BP 01/21/17 1405 (!) 142/96     Pulse Rate 01/21/17 1405 71     Resp --      Temp 01/21/17 1405 98.1 F (36.7 C)     Temp Source 01/21/17 1405 Oral     SpO2 01/21/17 1405 100 %  Weight --      Height --      Head Circumference --      Peak Flow --      Pain Score 01/21/17 1406 10     Pain Loc --      Pain Edu? --      Excl. in GC? --    No data found.   Updated Vital Signs BP (!) 142/96 (BP Location: Left Arm)   Pulse 71   Temp 98.1 F (36.7 C) (Oral)   LMP 01/02/2017 (Approximate)   SpO2 100%   Visual Acuity Right Eye Distance:   Left Eye Distance:   Bilateral Distance:    Right Eye Near:   Left Eye Near:    Bilateral Near:     Physical Exam  Constitutional: She is oriented to person, place, and time. She appears well-developed and well-nourished. No distress.  Eyes: EOM are normal.  Neck: Normal range of motion. Neck supple.  Cardiovascular: Normal rate.   Pulmonary/Chest: Effort normal. No respiratory distress.  Musculoskeletal: She exhibits no edema.  Direct tenderness over  the left parathoracic musculature at T6 and 7. Tenderness over the involved rib. Pain is reproduced as she rotates left and right with her torso. No spinal tenderness, deformity, step-off deformity.  Neurological: She is alert and oriented to person, place, and time. She exhibits normal muscle tone.  Skin: Skin is warm and dry.  Psychiatric: She has a normal mood and affect.  Nursing note and vitals reviewed.    UC Treatments / Results  Labs (all labs ordered are listed, but only abnormal results are displayed) Labs Reviewed  POCT URINALYSIS DIP (DEVICE) - Abnormal; Notable for the following:       Result Value   Hgb urine dipstick MODERATE (*)    Leukocytes, UA TRACE (*)    All other components within normal limits  POCT PREGNANCY, URINE    EKG  EKG Interpretation None       Radiology No results found.  Procedures Procedures (including critical care time)  Medications Ordered in UC Medications - No data to display   Initial Impression / Assessment and Plan / UC Course  I have reviewed the triage vital signs and the nursing notes.  Pertinent labs & imaging results that were available during my care of the patient were reviewed by me and considered in my medical decision making (see chart for details).    Apply heat to the area and perform gentle stretches. May also apply the salonpas menthol patches to help with muscle pain. Ibuprofen 600 mg every 6 hours as needed for pain. No heavy lifting or bending or repetitive work that causes the pain to be worse. Allow another week or so for the pain to get better. Sometimes it takes a long time for back muscle pain to get better.    Final Clinical Impressions(s) / UC Diagnoses   Final diagnoses:  Thoracic myofascial strain, initial encounter    New Prescriptions Current Discharge Medication List       Controlled Substance Prescriptions [place] Controlled Substance Registry consulted? Not Applicable   Hayden Rasmussen,  NP 01/21/17 1514

## 2017-01-21 NOTE — Discharge Instructions (Signed)
Apply heat to the area and perform gentle stretches. May also apply the salonpas menthol patches to help with muscle pain. Ibuprofen 600 mg every 6 hours as needed for pain. No heavy lifting or bending or repetitive work that causes the pain to be worse. Allow another week or so for the pain to get better. Sometimes it takes a long time for back muscle pain to get better.

## 2017-01-21 NOTE — ED Triage Notes (Signed)
Pt reports left back pain around her kidney and left lower abdominal pain along with urinary frequency, discharge and odor.

## 2017-02-22 ENCOUNTER — Encounter (HOSPITAL_COMMUNITY): Payer: Self-pay | Admitting: *Deleted

## 2017-03-17 ENCOUNTER — Encounter (HOSPITAL_COMMUNITY): Payer: Self-pay

## 2017-03-17 ENCOUNTER — Inpatient Hospital Stay (HOSPITAL_COMMUNITY)
Admission: AD | Admit: 2017-03-17 | Discharge: 2017-03-17 | Disposition: A | Discharge status: Home / Self Care | Payer: Medicaid Other | Source: Ambulatory Visit | Attending: Obstetrics & Gynecology | Admitting: Obstetrics & Gynecology

## 2017-03-17 DIAGNOSIS — N939 Abnormal uterine and vaginal bleeding, unspecified: Secondary | ICD-10-CM | POA: Insufficient documentation

## 2017-03-17 DIAGNOSIS — R102 Pelvic and perineal pain: Secondary | ICD-10-CM | POA: Diagnosis not present

## 2017-03-17 DIAGNOSIS — Z793 Long term (current) use of hormonal contraceptives: Secondary | ICD-10-CM | POA: Insufficient documentation

## 2017-03-17 DIAGNOSIS — N921 Excessive and frequent menstruation with irregular cycle: Secondary | ICD-10-CM

## 2017-03-17 DIAGNOSIS — F17210 Nicotine dependence, cigarettes, uncomplicated: Secondary | ICD-10-CM | POA: Insufficient documentation

## 2017-03-17 DIAGNOSIS — R109 Unspecified abdominal pain: Secondary | ICD-10-CM | POA: Diagnosis present

## 2017-03-17 LAB — CBC
HEMATOCRIT: 36.6 % (ref 36.0–46.0)
Hemoglobin: 11.5 g/dL — ABNORMAL LOW (ref 12.0–15.0)
MCH: 27.4 pg (ref 26.0–34.0)
MCHC: 31.4 g/dL (ref 30.0–36.0)
MCV: 87.1 fL (ref 78.0–100.0)
Platelets: 286 10*3/uL (ref 150–400)
RBC: 4.2 MIL/uL (ref 3.87–5.11)
RDW: 14.8 % (ref 11.5–15.5)
WBC: 11.8 10*3/uL — ABNORMAL HIGH (ref 4.0–10.5)

## 2017-03-17 LAB — URINALYSIS, MICROSCOPIC (REFLEX)

## 2017-03-17 LAB — URINALYSIS, ROUTINE W REFLEX MICROSCOPIC

## 2017-03-17 LAB — WET PREP, GENITAL
CLUE CELLS WET PREP: NONE SEEN
SPERM: NONE SEEN
TRICH WET PREP: NONE SEEN
YEAST WET PREP: NONE SEEN

## 2017-03-17 LAB — POCT PREGNANCY, URINE: PREG TEST UR: NEGATIVE

## 2017-03-17 MED ORDER — MEGESTROL ACETATE 40 MG PO TABS
40.0000 mg | ORAL_TABLET | Freq: Two times a day (BID) | ORAL | 0 refills | Status: DC
Start: 2017-03-17 — End: 2017-08-10

## 2017-03-17 NOTE — MAU Note (Addendum)
Pt reports she has been bleeding since 10/15. States she had Depo on 10/05 because she had been bleeding for a month at that time. States she started bleeding 5 days after the Depo and hasn't stopped. Reports some cramping off/on

## 2017-03-17 NOTE — MAU Provider Note (Signed)
History     CSN: [id number]  Arrival date and time: 03/17/17 1041   First Provider Initiated Contact with Patient 03/17/17 1320      Chief Complaint  Patient presents with  . Vaginal Bleeding  . Abdominal Pain   Julie Hendrix is a 31 y.o. [id number] who presents today with vaginal bleeding. She states that she had bleeding most of September and October. [facility] and started Depo on 01/22/17 to see if that would help. She reports that she had 2 weeks of no bleeding and then has continued to bleed. She states that she has been bleeding every day since around 10/19. Depo Due at end of December.    Vaginal Bleeding  The patient's primary symptoms include pelvic pain and vaginal bleeding. This is a new problem. The current episode started more than 1 month ago. The problem occurs intermittently. The problem has been unchanged. The pain is mild. The problem affects both sides. She is not pregnant. Pertinent negatives include no chills, fever, nausea or vomiting. The vaginal discharge was bloody. The vaginal bleeding is heavier than menses. She has been passing clots (about the size of a tangerine). She has not been passing tissue. Nothing aggravates the symptoms. Treatments tried: depo. The treatment provided no relief. She is sexually active. She uses progestin injections for contraception. Her menstrual history has been irregular. (Patient reports that last pap was abnormal and she is wait[facility]. )    Past Medical History:  Diagnosis Date  . Abnormal Pap smear   . HPV (human papilloma virus) anogenital infection     Past Surgical History:  Procedure Laterality Date  . COLPOSCOPY    . NO PAST SURGERIES      Family History  Problem Relation Age of Onset  . Hypertension Father   . Diabetes Father   . Heart attack Father   . Hypertension Mother     Social History   Tobacco Use  . Smoking status: Current Every Day Smoker    Packs/day: 1.00  .  Smokeless tobacco: Never Used  Substance Use Topics  . Alcohol use: Yes    Comment: occasionally  . Drug use: No    Allergies:  Allergies  Allergen Reactions  . Latex Rash    Medications Prior to Admission  Medication Sig Dispense Refill Last Dose  . cyclobenzaprine (FLEXERIL) 10 MG tablet Take 1 tablet (10 mg total) by mouth 2 (two) times daily as needed for muscle spasms. 20 tablet 0 More than a month at Unknown time    Review of Systems  Constitutional: Negative for chills and fever.  Gastrointestinal: Negative for nausea and vomiting.  Genitourinary: Positive for pelvic pain and vaginal bleeding.   Physical Exam   Blood pressure (!) 144/96, pulse 79, temperature 97.6 F (36.4 C), temperature source Oral, resp. rate 16, height 5\' 3"  (1.6 m), weight 265 lb (120.2 kg), last menstrual period 02/01/2017, SpO2 98 %.  Physical Exam  Nursing note and vitals reviewed. Constitutional: She is oriented to person, place, and time. She appears well-developed and well-nourished. No distress.  HENT:  Head: Normocephalic.  Cardiovascular: Normal rate.  Respiratory: Effort normal.  GI: Soft. There is no tenderness. There is no rebound.  Genitourinary:  Genitourinary Comments:  External: no lesion Vagina: small amount of blood noted  Cervix: pink, smooth, no CMT Uterus: NSSC Adnexa: NT   Neurological: She is alert and oriented to person, place, and time.  Skin: Skin is  warm and dry.  Psychiatric: She has a normal mood and affect.   Results for orders placed or performed during the hospital encounter of 03/17/17 (from the past 24 hour(s))  Urinalysis, Routine w reflex microscopic     Status: Abnormal   Collection Time: 03/17/17 11:24 AM  Result Value Ref Range   Color, Urine RED (A) YELLOW   APPearance CLOUDY (A) CLEAR   Specific Gravity, Urine  1.005 - 1.030    TEST NOT REPORTED DUE TO COLOR INTERFERENCE OF URINE PIGMENT   pH  5.0 - 8.0    TEST NOT REPORTED DUE TO COLOR  INTERFERENCE OF URINE PIGMENT   Glucose, UA (A) NEGATIVE mg/dL    TEST NOT REPORTED DUE TO COLOR INTERFERENCE OF URINE PIGMENT   Hgb urine dipstick (A) NEGATIVE    TEST NOT REPORTED DUE TO COLOR INTERFERENCE OF URINE PIGMENT   Bilirubin Urine (A) NEGATIVE    TEST NOT REPORTED DUE TO COLOR INTERFERENCE OF URINE PIGMENT   Ketones, ur (A) NEGATIVE mg/dL    TEST NOT REPORTED DUE TO COLOR INTERFERENCE OF URINE PIGMENT   Protein, ur (A) NEGATIVE mg/dL    TEST NOT REPORTED DUE TO COLOR INTERFERENCE OF URINE PIGMENT   Nitrite (A) NEGATIVE    TEST NOT REPORTED DUE TO COLOR INTERFERENCE OF URINE PIGMENT   Leukocytes, UA (A) NEGATIVE    TEST NOT REPORTED DUE TO COLOR INTERFERENCE OF URINE PIGMENT  Urinalysis, Microscopic (reflex)     Status: Abnormal   Collection Time: 03/17/17 11:24 AM  Result Value Ref Range   RBC / HPF TOO NUMEROUS TO COUNT 0 - 5 RBC/hpf   WBC, UA 0-5 0 - 5 WBC/hpf   Bacteria, UA MANY (A) NONE SEEN   Squamous Epithelial / LPF 0-5 (A) NONE SEEN   Urine-Other MUCOUS PRESENT   Pregnancy, urine POC     Status: None   Collection Time: 03/17/17 11:31 AM  Result Value Ref Range   Preg Test, Ur NEGATIVE NEGATIVE  Wet prep, genital     Status: Abnormal   Collection Time: 03/17/17  1:30 PM  Result Value Ref Range   Yeast Wet Prep HPF POC NONE SEEN NONE SEEN   Trich, Wet Prep NONE SEEN NONE SEEN   Clue Cells Wet Prep HPF POC NONE SEEN NONE SEEN   WBC, Wet Prep HPF POC FEW (A) NONE SEEN   Sperm NONE SEEN   CBC     Status: Abnormal   Collection Time: 03/17/17  1:31 PM  Result Value Ref Range   WBC 11.8 (H) 4.0 - 10.5 K/uL   RBC 4.20 3.87 - 5.11 MIL/uL   Hemoglobin 11.5 (L) 12.0 - 15.0 g/dL   HCT 16.136.6 09.636.0 - 04.546.0 %   MCV 87.1 78.0 - 100.0 fL   MCH 27.4 26.0 - 34.0 pg   MCHC 31.4 30.0 - 36.0 g/dL   RDW 40.914.8 81.111.5 - 91.415.5 %   Platelets 286 150 - 400 K/uL    MAU Course  Procedures  MDM   Assessment and Plan   1. Breakthrough bleeding on Depo-Provera   2. Abnormal  uterine bleeding    DC home Comfort measures reviewed  Bleeding precautions RX: megace 40mg  BID #60 with 1 RF  Return to MAU as needed FU with GYN    Follow-up Information    [facility]   Specialty:  Obstetrics and Gynecology Contact information: [address] [place] [id number] [phone]  Thressa Sheller 03/17/2017, 1:22 PM

## 2017-03-17 NOTE — Discharge Instructions (Signed)
In late 2019, the [facility] to the [facility]. At that time, the MAU (Maternity Admissions Unit), where you are being seen today, will no longer see non-pregnant patients. We strongly encourage you to find a doctor's office before that time, so that you can be seen with any GYN concerns, like vaginal discharge, urinary tract infection, etc.. in a timely manner.   In order to make the office visit more convenient, the Center for [facility] at [facility] will be offering evening hours from 4pm-7:30pm on Monday. There will be same-day appointments, walk-in appointments and scheduled appointments available during this time. We will be adding more evening hours over the next year before the move.   Center [facility] [phone]- [phone]  For urgent needs, [facility] is also available for management of urgent GYN complaints such as vaginal discharge or urinary tract infections.   Primary care follow up  [facility] (will see you even if you do not have sickle cell): [phone] [facility]: [phone] [facility]: [phone]    Abnormal Uterine Bleeding Abnormal uterine bleeding means bleeding more than usual from your uterus. It can include:  Bleeding between periods.  Bleeding after sex.  Bleeding that is heavier than normal.  Periods that last longer than usual.  Bleeding after you have stopped having your period (menopause).  There are many problems that may cause this. You should see a doctor for any kind of bleeding that is not normal. Treatment depends on the cause of the bleeding. Follow these instructions at home:  Watch your condition for any changes.  Do not use tampons, douche, or have sex, if your doctor tells you not to.  Change your pads often.  Get regular well-woman exams. Make sure they include a pelvic exam and cervical cancer  screening.  Keep all follow-up visits as told by your doctor. This is important. Contact a doctor if:  The bleeding lasts more than one week.  You feel dizzy at times.  You feel like you are going to throw up (nauseous).  You throw up. Get help right away if:  You pass out.  You have to change pads every hour.  You have belly (abdominal) pain.  You have a fever.  You get sweaty.  You get weak.  You passing large blood clots from your vagina. Summary  Abnormal uterine bleeding means bleeding more than usual from your uterus.  There are many problems that may cause this. You should see a doctor for any kind of bleeding that is not normal.  Treatment depends on the cause of the bleeding. This information is not intended to replace advice given to you by your health care provider. Make sure you discuss any questions you have with your health care provider. Document Released: 02/01/2009 Document Revised: 03/31/2016 Document Reviewed: 03/31/2016 Elsevier Interactive Patient Education  2017 [employer]

## 2017-03-18 LAB — GC/CHLAMYDIA PROBE AMP (~~LOC~~) NOT AT ARMC
Chlamydia: NEGATIVE
Neisseria Gonorrhea: NEGATIVE

## 2017-04-01 ENCOUNTER — Ambulatory Visit (HOSPITAL_COMMUNITY): Payer: Self-pay

## 2017-04-06 ENCOUNTER — Ambulatory Visit: Payer: Self-pay | Admitting: Obstetrics & Gynecology

## 2017-05-04 ENCOUNTER — Ambulatory Visit (HOSPITAL_COMMUNITY)
Admission: RE | Admit: 2017-05-04 | Discharge: 2017-05-04 | Disposition: A | Discharge status: Home / Self Care | Payer: No Typology Code available for payment source | Source: Ambulatory Visit | Attending: Obstetrics and Gynecology | Admitting: Obstetrics and Gynecology

## 2017-05-04 ENCOUNTER — Other Ambulatory Visit (HOSPITAL_COMMUNITY): Payer: Self-pay | Admitting: *Deleted

## 2017-05-04 ENCOUNTER — Encounter (HOSPITAL_COMMUNITY): Payer: Self-pay

## 2017-05-04 VITALS — BP 136/86 | Ht 63.0 in | Wt 266.0 lb

## 2017-05-04 DIAGNOSIS — Z1239 Encounter for other screening for malignant neoplasm of breast: Secondary | ICD-10-CM

## 2017-05-04 DIAGNOSIS — R87612 Low grade squamous intraepithelial lesion on cytologic smear of cervix (LGSIL): Secondary | ICD-10-CM

## 2017-05-04 DIAGNOSIS — N6320 Unspecified lump in the left breast, unspecified quadrant: Secondary | ICD-10-CM

## 2017-05-04 NOTE — Patient Instructions (Signed)
Explained breast self awareness with [employer] Fuston. Patient did not need a Pap smear today due to last Pap smear was 01/26/2017. Explained the colposcopy the recommended follow-up for her abnormal Pap smear. Referred patient to the Center for [facility] for a colposcopy to follow-up for abnormal Pap smear. Appointment scheduled for Friday, May 21, 2017 at 0955. [facility] for [facility] mamm[facility] and possible left breast ultrasound. Appointment scheduled for Friday, May 07, 2017 at 0930. Patient aware of appointments and will be there. Discussed smoking cessation with patient. Referred patient to the [facility] and gave resources to free smoking cessation classes at [facility]. Tayonna Tow verbalized understanding.  Lujean Ebright, Kathaleen Maserhristine Poll, RN 3:07 PM

## 2017-05-04 NOTE — Progress Notes (Signed)
[facility] due to having an abnormal Pap smear on 01/26/2017 that a colposcopy is recommended for follow-up.  Pap Smear: Pap smear not completed today. [facility] and LGSIL Referred patient to the Center for [facility] for a colposcopy to follow-up for abnormal Pap smear. Appointment scheduled for Friday, May 21, 2017 at 0955. Patient has a history of two abnormal Pap smears on 05/29/2016 that was ASCUS with negative HPV that a colposcopy was completed on 06/22/2016 that was benign and 11/14/2015 that was ASCUS with positive HPV. Patient stated did not follow up per recommendations on for her abnormal Pap smear on 11/14/2015. It was recommended to have a colposcopy completed and due to being over six months since Pap smear was completed needed to have a repeat Pap smear. Patient stated she had one other abnormal Pap smear but was unsure of the date. Patient stated she had a colposcopy for follow-up. Last four Pap smear and last colposcopy results are in Epic.  Physical exam: Breasts Breasts symmetrical. No skin abnormalities bilateral breasts. No nipple retraction bilateral breasts. No nipple discharge bilateral breasts. No lymphadenopathy. No lumps palpated right breast. Palpated a pea sized lump within the left breast at 9 o'clock 8 cm from the nipple. Patient complained of bilateral inner breast tenderness on exam. Ref[facility] for diagnostic mammogram and possible left breast ultrasound. Appointment scheduled for Friday, May 07, 2017 at 0930.        Pelvic/Bimanual No Pap smear completed today since last Pap smear was 01/26/2017. Pap smear not indicated per BCCCP guidelines.   Smoking History: Patient is a current smoker. Discussed smoking cessation with patient. Referred patient to the [facility] and gave resources to  free smoking cessation classes at [facility].  Patient Navigation: Patient education provided. Access to services provided for patient through [facility] program.

## 2017-05-05 ENCOUNTER — Encounter (HOSPITAL_COMMUNITY): Payer: Self-pay | Admitting: *Deleted

## 2017-05-07 ENCOUNTER — Ambulatory Visit
Admission: RE | Admit: 2017-05-07 | Discharge: 2017-05-07 | Disposition: A | Discharge status: Home / Self Care | Payer: No Typology Code available for payment source | Source: Ambulatory Visit | Attending: Obstetrics and Gynecology | Admitting: Obstetrics and Gynecology

## 2017-05-07 DIAGNOSIS — N6320 Unspecified lump in the left breast, unspecified quadrant: Secondary | ICD-10-CM

## 2017-05-16 ENCOUNTER — Other Ambulatory Visit: Payer: Self-pay

## 2017-05-16 ENCOUNTER — Encounter (HOSPITAL_COMMUNITY): Payer: Self-pay | Admitting: *Deleted

## 2017-05-16 ENCOUNTER — Ambulatory Visit (HOSPITAL_COMMUNITY)
Admission: EM | Admit: 2017-05-16 | Discharge: 2017-05-16 | Disposition: A | Discharge status: Home / Self Care | Payer: Medicaid Other | Attending: Family Medicine | Admitting: Family Medicine

## 2017-05-16 DIAGNOSIS — T148XXA Other injury of unspecified body region, initial encounter: Secondary | ICD-10-CM

## 2017-05-16 DIAGNOSIS — M542 Cervicalgia: Secondary | ICD-10-CM

## 2017-05-16 DIAGNOSIS — M5489 Other dorsalgia: Secondary | ICD-10-CM

## 2017-05-16 MED ORDER — METHOCARBAMOL 500 MG PO TABS: 500.0000 mg | ORAL_TABLET | Freq: Two times a day (BID) | ORAL | 0 refills | Status: DC

## 2017-05-16 MED ORDER — CYCLOBENZAPRINE HCL 5 MG PO TABS: 5.0000 mg | ORAL_TABLET | Freq: Every day | ORAL | 0 refills | Status: DC

## 2017-05-16 MED ORDER — DICLOFENAC SODIUM 75 MG PO TBEC: 75.0000 mg | DELAYED_RELEASE_TABLET | Freq: Two times a day (BID) | ORAL | 0 refills | Status: DC

## 2017-05-16 NOTE — ED Triage Notes (Signed)
Neck on both side and back of her neck is hurting, per pt her upper back back and lower back hurts

## 2017-05-16 NOTE — ED Provider Notes (Signed)
[facility]   [id number] 05/16/17 Arrival Time: 1614   SUBJECTIVE:  Julie Hendrix is a 32 y.o. female who presents to the urgent care with complaint of Neck on both side and back of her neck is hurting, per pt her upper back back and lower back hurts  Works at [employer] on [employer] [address] [employer].  Long hard hours at minimum wage.   Has 89 yo son starting to get into trouble.  Past Medical History:  Diagnosis Date  . Abnormal Pap smear   . HPV (human papilloma virus) anogenital infection    Family History  Problem Relation Age of Onset  . Hypertension Father   . Diabetes Father   . Heart attack Father   . Hypertension Mother    Social History   Socioeconomic History  . Marital status: Single    Spouse name: Not on file  . Number of children: Not on file  . Years of education: Not on file  . Highest education level: Not on file  Social Needs  . Financial resource strain: Not on file  . Food insecurity - worry: Not on file  . Food insecurity - inability: Not on file  . Transportation needs - medical: Not on file  . Transportation needs - non-medical: Not on file  Occupational History  . Not on file  Tobacco Use  . Smoking status: Current Every Day Smoker    Packs/day: 1.00  . Smokeless tobacco: Never Used  Substance and Sexual Activity  . Alcohol use: Yes    Comment: occasionally  . Drug use: No  . Sexual activity: Yes    Birth control/protection: None  Other Topics Concern  . Not on file  Social History Narrative  . Not on file   Current Meds  Medication Sig  . megestrol (MEGACE) 40 MG tablet Take 1 tablet (40 mg total) by mouth 2 (two) times daily.   Allergies  Allergen Reactions  . Latex Rash      ROS: As per HPI, remainder of ROS negative.   OBJECTIVE:   Vitals:   05/16/17 1703  BP: 139/78  Pulse: 87  Temp: 98.3 F (36.8 C)  TempSrc: Oral  SpO2: 99%     General appearance: alert; no distress Eyes: PERRL; EOMI; conjunctiva normal HENT:  normocephalic; atraumatic; TMs normal, canal normal, external ears normal without trauma; nasal mucosa normal; oral mucosa normal Neck: supple Lungs: clear to auscultation bilaterally Heart: regular rate and rhythm Back: no CVA tenderness Extremities: no cyanosis or edema; symmetrical with no gross deformities Skin: warm and dry; healed scars on forehead, right cheek and right shoulder. Neurologic: normal gait; grossly normal Psychological: alert and cooperative; normal mood and affect      Labs:  Results for orders placed or performed during the hospital encounter of 03/17/17  Wet prep, genital  Result Value Ref Range   Yeast Wet Prep HPF POC NONE SEEN NONE SEEN   Trich, Wet Prep NONE SEEN NONE SEEN   Clue Cells Wet Prep HPF POC NONE SEEN NONE SEEN   WBC, Wet Prep HPF POC FEW (A) NONE SEEN   Sperm NONE SEEN   Urinalysis, Routine w reflex microscopic  Result Value Ref Range   Color, Urine RED (A) YELLOW   APPearance CLOUDY (A) CLEAR   Specific Gravity, Urine  1.005 - 1.030    TEST NOT REPORTED DUE TO COLOR INTERFERENCE OF URINE PIGMENT   pH  5.0 - 8.0    TEST NOT REPORTED DUE TO  COLOR INTERFERENCE OF URINE PIGMENT   Glucose, UA (A) NEGATIVE mg/dL    TEST NOT REPORTED DUE TO COLOR INTERFERENCE OF URINE PIGMENT   Hgb urine dipstick (A) NEGATIVE    TEST NOT REPORTED DUE TO COLOR INTERFERENCE OF URINE PIGMENT   Bilirubin Urine (A) NEGATIVE    TEST NOT REPORTED DUE TO COLOR INTERFERENCE OF URINE PIGMENT   Ketones, ur (A) NEGATIVE mg/dL    TEST NOT REPORTED DUE TO COLOR INTERFERENCE OF URINE PIGMENT   Protein, ur (A) NEGATIVE mg/dL    TEST NOT REPORTED DUE TO COLOR INTERFERENCE OF URINE PIGMENT   Nitrite (A) NEGATIVE    TEST NOT REPORTED DUE TO COLOR INTERFERENCE OF URINE PIGMENT   Leukocytes, UA (A) NEGATIVE    TEST NOT REPORTED DUE TO COLOR INTERFERENCE OF URINE PIGMENT  Urinalysis, Microscopic (reflex)  Result Value Ref Range   RBC / HPF TOO NUMEROUS TO COUNT 0 - 5  RBC/hpf   WBC, UA 0-5 0 - 5 WBC/hpf   Bacteria, UA MANY (A) NONE SEEN   Squamous Epithelial / LPF 0-5 (A) NONE SEEN   Urine-Other MUCOUS PRESENT   CBC  Result Value Ref Range   WBC 11.8 (H) 4.0 - 10.5 K/uL   RBC 4.20 3.87 - 5.11 MIL/uL   Hemoglobin 11.5 (L) 12.0 - 15.0 g/dL   HCT 16.136.6 09.636.0 - 04.546.0 %   MCV 87.1 78.0 - 100.0 fL   MCH 27.4 26.0 - 34.0 pg   MCHC 31.4 30.0 - 36.0 g/dL   RDW 40.914.8 81.111.5 - 91.415.5 %   Platelets 286 150 - 400 K/uL  Pregnancy, urine POC  Result Value Ref Range   Preg Test, Ur NEGATIVE NEGATIVE  GC/Chlamydia probe amp ([place])not at [facility]  Result Value Ref Range   Chlamydia Negative    Neisseria gonorrhea Negative     Labs Reviewed - No data to display  No results found.     ASSESSMENT & PLAN:  1. Muscle strain     Meds ordered this encounter  Medications  . DISCONTD: cyclobenzaprine (FLEXERIL) 5 MG tablet    Sig: Take 1 tablet (5 mg total) by mouth at bedtime.    Dispense:  7 tablet    Refill:  0  . diclofenac (VOLTAREN) 75 MG EC tablet    Sig: Take 1 tablet (75 mg total) by mouth 2 (two) times daily.    Dispense:  14 tablet    Refill:  0  . methocarbamol (ROBAXIN) 500 MG tablet    Sig: Take 1 tablet (500 mg total) by mouth 2 (two) times daily.    Dispense:  20 tablet    Refill:  0    Reviewed expectations re: course of current medical issues. Questions answered. Outlined signs and symptoms indicating need for more acute intervention. Patient verbalized understanding. After Visit Summary given.    Procedures:      Elvina SidleLauenstein, Momoka Stringfield, MD 05/16/17 1740

## 2017-05-16 NOTE — Discharge Instructions (Signed)
M8d 2 Rise for job opportunity:  Julie Hendrix  [phone] Deanna ArtisBritt Lassiter [employer]"  who works at [employer] on [address] [place].  Ask school counselor for how to connect with one of them for your son

## 2017-05-21 ENCOUNTER — Encounter: Payer: Self-pay | Admitting: Obstetrics and Gynecology

## 2017-05-21 ENCOUNTER — Other Ambulatory Visit (HOSPITAL_COMMUNITY)
Admission: RE | Admit: 2017-05-21 | Discharge: 2017-05-21 | Disposition: A | Discharge status: Home / Self Care | Payer: Medicaid Other | Source: Ambulatory Visit | Attending: Obstetrics and Gynecology | Admitting: Obstetrics and Gynecology

## 2017-05-21 ENCOUNTER — Ambulatory Visit (INDEPENDENT_AMBULATORY_CARE_PROVIDER_SITE_OTHER): Payer: Medicaid Other | Admitting: Obstetrics and Gynecology

## 2017-05-21 VITALS — BP 137/86 | HR 79 | Ht 63.0 in | Wt 264.1 lb

## 2017-05-21 DIAGNOSIS — R87612 Low grade squamous intraepithelial lesion on cytologic smear of cervix (LGSIL): Secondary | ICD-10-CM

## 2017-05-21 DIAGNOSIS — N939 Abnormal uterine and vaginal bleeding, unspecified: Secondary | ICD-10-CM | POA: Diagnosis not present

## 2017-05-21 DIAGNOSIS — N871 Moderate cervical dysplasia: Secondary | ICD-10-CM

## 2017-05-21 HISTORY — DX: Moderate cervical dysplasia: N87.1

## 2017-05-21 NOTE — Progress Notes (Signed)
    GYNECOLOGY CLINIC COLPOSCOPY PROCEDURE NOTE  32 y.o. [id number] here for colposcopy for low-grade squamous intraepithelial neoplasia (LGSIL - encompassing HPV,mild dysplasia,CIN I) pap smear on 01/26/17. Discussed role for HPV in cervical dysplasia, need for surveillance. Patient with previous abnormal pap smear. Had colposcopy 06-22-16 which was benign.  Patient given informed consent, signed copy in the chart, time out was performed.  Placed in lithotomy position. Cervix viewed with speculum and colposcope after application of acetic acid.   Colposcopy adequate? Yes  acetowhite lesion(s) noted at 12 and 6 o'clock and mosaicism noted at 12 o'clock; corresponding biopsies obtained.  ECC specimen obtained. All specimens were labeled and sent to pathology.  Acetowhite lesions? yes Punctation? No Mosaicism?  Yes Abnormal vasculature?  No Biopsies? Yes, one at 12 o'clock and one at 6 o'clock ECC? Yes Complications? None   Patient was given post procedure instructions.  Will follow up pathology and manage accordingly; patient will be contacted with results and recommendations. Encouraged patient to stop smoking. Routine preventative health maintenance measures emphasized.   Caryl AdaJazma Phelps, DO OB Fellow Faculty Practice, [facility] 05/21/2017, 1:25 PM

## 2017-05-21 NOTE — Addendum Note (Signed)
Addended by: Henrietta DineNEAL, PAMELA S on: 05/21/2017 04:12 PM   Modules accepted: Orders

## 2017-05-21 NOTE — Progress Notes (Signed)
Pt states is having light bleeding

## 2017-05-21 NOTE — Patient Instructions (Signed)
Colposcopy, Care After  This sheet gives you information about how to care for yourself after your procedure. Your doctor may also give you more specific instructions. If you have problems or questions, contact your doctor.  What can I expect after the procedure?  If you did not have a tissue sample removed (did not have a biopsy), you may only have some spotting for a few days. You can go back to your normal activities.  If you had a tissue sample removed, it is common to have:  · Soreness and pain. This may last for a few days.  · Light-headedness.  · Mild bleeding from your vagina or dark-colored, grainy discharge from your vagina. This may last for a few days. You may need to wear a sanitary pad.  · Spotting for at least 48 hours after the procedure.    Follow these instructions at home:  · Take over-the-counter and prescription medicines only as told by your doctor. Ask your doctor what medicines you can start taking again. This is very important if you take blood-thinning medicine.  · Do not drive or use heavy machinery while taking prescription pain medicine.  · For 3 days, or as long as your doctor tells you, avoid:  ? Douching.  ? Using tampons.  ? Having sex.  · If you use birth control (contraception), keep using it.  · Limit activity for the first day after the procedure. Ask your doctor what activities are safe for you.  · It is up to you to get the results of your procedure. Ask your doctor when your results will be ready.  · Keep all follow-up visits as told by your doctor. This is important.  Contact a doctor if:  · You get a skin rash.  Get help right away if:  · You are bleeding a lot from your vagina. It is a lot of bleeding if you are using more than one pad an hour for 2 hours in a row.  · You have clumps of blood (blood clots) coming from your vagina.  · You have a fever.  · You have chills  · You have pain in your lower belly (pelvic area).  · You have signs of infection, such as vaginal  discharge that is:  ? Different than usual.  ? Yellow.  ? Bad-smelling.  · You have very pain or cramps in your lower belly that do not get better with medicine.  · You feel light-headed.  · You feel dizzy.  · You pass out (faint).  Summary  · If you did not have a tissue sample removed (did not have a biopsy), you may only have some spotting for a few days. You can go back to your normal activities.  · If you had a tissue sample removed, it is common to have mild pain and spotting for 48 hours.  · For 3 days, or as long as your doctor tells you, avoid douching, using tampons and having sex.  · Get help right away if you have bleeding, very bad pain, or signs of infection.  This information is not intended to replace advice given to you by your health care provider. Make sure you discuss any questions you have with your health care provider.  Document Released: 09/23/2007 Document Revised: 12/25/2015 Document Reviewed: 12/25/2015  Elsevier Interactive Patient Education © 2018 Elsevier Inc.

## 2017-05-24 LAB — POCT PREGNANCY, URINE: PREG TEST UR: NEGATIVE

## 2017-05-25 ENCOUNTER — Encounter: Payer: Self-pay | Admitting: *Deleted

## 2017-06-07 ENCOUNTER — Telehealth (HOSPITAL_COMMUNITY): Payer: Self-pay | Admitting: *Deleted

## 2017-06-07 NOTE — Telephone Encounter (Signed)
Telephoned patient at home number and line was busy. Need to fill out [facility] Medicaid paperwork.

## 2017-06-08 ENCOUNTER — Ambulatory Visit (INDEPENDENT_AMBULATORY_CARE_PROVIDER_SITE_OTHER): Payer: Medicaid Other | Admitting: Obstetrics and Gynecology

## 2017-06-08 ENCOUNTER — Encounter: Payer: Self-pay | Admitting: Obstetrics and Gynecology

## 2017-06-08 VITALS — BP 131/79 | HR 79 | Ht 63.0 in | Wt 263.9 lb

## 2017-06-08 DIAGNOSIS — N871 Moderate cervical dysplasia: Secondary | ICD-10-CM | POA: Diagnosis not present

## 2017-06-08 HISTORY — PX: CRYOTHERAPY: SHX1416

## 2017-06-08 LAB — POCT PREGNANCY, URINE: Preg Test, Ur: NEGATIVE

## 2017-06-08 NOTE — Patient Instructions (Signed)
Cervical cryotherapy is a procedure which involves freezing an area of abnormal tissue on the cervix. This tissue gradually disappears and the cervix heals. One cervical cryotherapy is usually sufficient to destroy the abnormal tissue.  Purpose  Cervical cryotherapy is a standard method used to treat cervical dysplasia, meaning the removal of abnormal cell tissue on the cervix.  Description  Cervical cryotherapy, or freezing, usually lasts about five minutes and causes a slight amount of discomfort. The procedure is usually performed in an outpatient setting.  Cervical cryotherapy is done by placing a small freeze-probe (cryoprobe) against the cervix that cools the cervix to sub-zero temperatures. The cells destroyed by freezing are shed afterwards in a heavy watery discharge. The main advantage of cryotherapy is that it is a simple procedure that requires inexpensive equipment.  The cryogenic device consists of a gas tank containing a refrigerant and non-explosive, non-toxic gas (usually nitrous oxide). The gas is delivered using flexible tubing through a gun-type attachment to the cryoprobe.  Diagnosis/Preparation  Women who undergo cervical cryotherapy typically have had an abnormal Pap smear which has led to a diagnosis of cervical squamous dysplasia and usually confirmed by biopsy after an adequate colposcopic exam.  Preparation for cervical cryotherapy involves scheduling the procedure when the patient is not experiencing heavy menstrual flow. Ibuprofen or naproxen sodium may be given before cryotherapy to decrease cramping. If there is any doubt about the pregnancy status, a pregnancy test is performed.  Aftercare  Cervical cryotherapy is often followed by a heavy and often odorous discharge during the first month after the procedure. The discharge is due to the dead tissue cells leaving the treatment site. The patient should abstain from sexual intercourse and not use tampons for a period of two  weeks after the procedure. Excessive exercise should also be avoided to lessen the occurrence of post-therapy bleeding.  Risks  The following risks have been associated with cervical cryotherapy:  Uterine cramping. Often occurs during the cryotherapy but rapidly subsides after treatment.  Bleeding and infection. Rare, but incidences have been reported.  More difficult Pap smears. Future Pap smears and colposcopy may be more difficult after cryotherapy.  Normal results  A normal result is no recurrence of the abnormal cervix cells. The first follow-up Pap smear is done at 6 months then 12 months after the procedure. If normal, patients resume usual pap smear screening.  If any, recurrences usually occur within two years of treatment.  If a follow-up Pap smear is abnormal, a colposcopy with biopsy is usually performed. Other treatment methods, usually the loop electrocautery excision procedure (LEEP) are then used if persistent disease is discovered.  Following the procedure, it is considered normal to experience the following:  slight cramping for two to three days  watery discharge requiring several pad changes daily  Bloody or brown discharge, especially 12-16 days after the procedure  Alternatives  Loop electrocautery excision procedure (LEEP). This procedure uses a fine wire loop with an electric current flowing through it to remove the desired area of the cervix. Loop excision is usually done under local anesthesia and causes very little discomfort.    

## 2017-06-08 NOTE — Progress Notes (Signed)
    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  The indications for cryotherapy were reviewed with the patient in detail. She was counseled about that efficacy of this procedure, and possible need for excisional procedure in the future if her cervical dysplasia persists.  The risks of the procedure where explained in detail and patient was told to expect a copious amount of discharge in the next few weeks. All her questions were answered, and written informed consent was obtained.  The patient was placed in the dorsal lithotomy position and a vaginal speculum was placed. Her cervix was visualized and patient was noted to have had normal size transformation zone. The appropriate cryotherapy probe was picked and affixed to cryotherapy apparatus. Then nitrogen gas was then activated, the probe was coated with lubricating jelly and applied to the transformation zone of the cervix. This was kept in place for 3 minutes. The cryotherapy was then stopped and all instruments were removed from the patient's pelvis; a thawing period of 3 minutes was observed.  A second cycle of cryotherapy was then administered to the cervix for 3 minutes.  The patient tolerated the procedure well without any complications. Routine post procedure instructions were given to the patient.  Will repeat pap smear in 6 months and manage accordingly.  Procedure was precepted by Dr. Hillis RangeAnyanwu   Julie Phelps, DO OB Fellow Center for [facility], [facility]

## 2017-06-11 ENCOUNTER — Encounter: Payer: Self-pay | Admitting: *Deleted

## 2017-06-14 ENCOUNTER — Telehealth: Payer: Self-pay | Admitting: General Practice

## 2017-06-14 NOTE — Telephone Encounter (Signed)
Patient called and left message on nurse line stating she wants to talk to Dr Doroteo GlassmanPhelps about the procedure she had on 2/19. Called patient, no answer- left message on voicemail stating we are trying to reach you to return your phone call, please call us back if you still need assistance. You may also send us a mychart message with your questions

## 2017-06-15 ENCOUNTER — Other Ambulatory Visit (HOSPITAL_COMMUNITY)
Admission: RE | Admit: 2017-06-15 | Discharge: 2017-06-15 | Disposition: A | Discharge status: Home / Self Care | Payer: Medicaid Other | Source: Ambulatory Visit | Attending: Family Medicine | Admitting: Family Medicine

## 2017-06-15 ENCOUNTER — Telehealth: Payer: Self-pay | Admitting: General Practice

## 2017-06-15 ENCOUNTER — Ambulatory Visit: Payer: Medicaid Other | Admitting: General Practice

## 2017-06-15 DIAGNOSIS — N898 Other specified noninflammatory disorders of vagina: Secondary | ICD-10-CM | POA: Diagnosis present

## 2017-06-15 NOTE — Telephone Encounter (Signed)
Patient called and left message on nurse line stating she has questions about the cryo procedure she had done & the side effects. Called patient and she states she is having a lot of watery discharge and cramping since the procedure. Patient states the discharge has a horrible smell to it. Told patient the procedure definitely causes a lot of discharge but we wouldn't expect it to have a horrible smell. Suggested to patient her cramping may be her period/irregular bleeding. Patient verbalized understanding and states the smell is just horrible to her. Patient states she is changing her pads frequently. Recommended she come in for a nurse visit for self swab today at 3:50pm to see if it's due to a common vaginal infection. Patient verbalized understanding and will be here later today. Patient had no questions

## 2017-06-15 NOTE — Progress Notes (Signed)
Patient here today reporting vaginal discharge with odor post cryotherapy. Patient instructed in self swab & specimen collected. Discussed with patient we will contact her with any abnormal results. Patient verbalized understanding & had no questions. See telephone note for additional information.

## 2017-06-16 LAB — CERVICOVAGINAL ANCILLARY ONLY
Bacterial vaginitis: POSITIVE — AB
CANDIDA VAGINITIS: NEGATIVE
TRICH (WINDOWPATH): NEGATIVE

## 2017-06-18 ENCOUNTER — Other Ambulatory Visit: Payer: Self-pay | Admitting: Medical

## 2017-06-18 DIAGNOSIS — N760 Acute vaginitis: Principal | ICD-10-CM

## 2017-06-18 DIAGNOSIS — B9689 Other specified bacterial agents as the cause of diseases classified elsewhere: Secondary | ICD-10-CM

## 2017-06-18 MED ORDER — METRONIDAZOLE 500 MG PO TABS
500.0000 mg | ORAL_TABLET | Freq: Two times a day (BID) | ORAL | 0 refills | Status: DC
Start: 2017-06-18 — End: 2017-06-28

## 2017-06-18 NOTE — Progress Notes (Signed)
I have reviewed the chart and agree with nursing staff's documentation of this patient's encounter.  Vonzella NippleJulie Ibrohim Simmers, PA-C 06/18/2017 8:51 AM

## 2017-06-21 ENCOUNTER — Encounter (HOSPITAL_COMMUNITY): Payer: Self-pay | Admitting: *Deleted

## 2017-06-23 ENCOUNTER — Ambulatory Visit (HOSPITAL_COMMUNITY)
Admission: EM | Admit: 2017-06-23 | Discharge: 2017-06-23 | Disposition: A | Discharge status: Home / Self Care | Payer: Medicaid Other | Attending: Family Medicine | Admitting: Family Medicine

## 2017-06-23 ENCOUNTER — Encounter (HOSPITAL_COMMUNITY): Payer: Self-pay | Admitting: Emergency Medicine

## 2017-06-23 DIAGNOSIS — M791 Myalgia, unspecified site: Secondary | ICD-10-CM | POA: Diagnosis not present

## 2017-06-23 DIAGNOSIS — R05 Cough: Secondary | ICD-10-CM | POA: Diagnosis not present

## 2017-06-23 DIAGNOSIS — J111 Influenza due to unidentified influenza virus with other respiratory manifestations: Secondary | ICD-10-CM

## 2017-06-23 DIAGNOSIS — R69 Illness, unspecified: Secondary | ICD-10-CM | POA: Diagnosis not present

## 2017-06-23 DIAGNOSIS — R0981 Nasal congestion: Secondary | ICD-10-CM | POA: Diagnosis not present

## 2017-06-23 NOTE — Discharge Instructions (Signed)

## 2017-06-23 NOTE — ED Triage Notes (Signed)
Pt here for URI sx x 4 days 

## 2017-06-23 NOTE — ED Provider Notes (Signed)
  [facility]   [id number] 06/23/17 Arrival Time: 1343  ASSESSMENT & PLAN:  1. Influenza-like illness    Work note given. Discussed typical duration of symptoms. OTC symptom care as needed. Ensure adequate fluid intake and rest. May f/u with PCP or here as needed.  Reviewed expectations re: course of current medical issues. Questions answered. Outlined signs and symptoms indicating need for more acute intervention. Patient verbalized understanding. After Visit Summary given.   SUBJECTIVE: History from: patient.  Julie Hendrix is a 32 y.o. female who presents with complaint of nasal congestion, post-nasal drainage, and a mild dry cough. Onset abrupt, approximately 3-4 days ago. Overall fatigued with body aches. SOB: none. Wheezing: none. Fever: unsure. Overall normal PO intake without n/v. Sick contacts: no. OTC treatment: cough drops and Sudafed with mild relief.   Social History   Tobacco Use  Smoking Status Current Every Day Smoker  . Packs/day: 1.00  Smokeless Tobacco Never Used    ROS: As per HPI.   OBJECTIVE:  Vitals:   06/23/17 1405  BP: (!) 156/83  Pulse: 91  Resp: 18  Temp: 98.8 F (37.1 C)  TempSrc: Oral  SpO2: 98%     General appearance: alert; appears fatigued HEENT: nasal congestion; clear runny nose; throat irritation secondary to post-nasal drainage Neck: supple without LAD Lungs: unlabored respirations, symmetrical air entry; cough: mild; no respiratory distress Skin: warm and dry Psychological: alert and cooperative; normal mood and affect  Imaging: No results found.  Allergies  Allergen Reactions  . Latex Rash    Past Medical History:  Diagnosis Date  . Abnormal Pap smear   . HPV (human papilloma virus) anogenital infection    Family History  Problem Relation Age of Onset  . Hypertension Father   . Diabetes Father   . Heart attack Father   . Hypertension Mother    Social History   Socioeconomic History  .  Marital status: Single    Spouse name: Not on file  . Number of children: Not on file  . Years of education: Not on file  . Highest education level: Not on file  Social Needs  . Financial resource strain: Not on file  . Food insecurity - worry: Not on file  . Food insecurity - inability: Not on file  . Transportation needs - medical: Not on file  . Transportation needs - non-medical: Not on file  Occupational History  . Not on file  Tobacco Use  . Smoking status: Current Every Day Smoker    Packs/day: 1.00  . Smokeless tobacco: Never Used  Substance and Sexual Activity  . Alcohol use: Yes    Comment: occasionally  . Drug use: No  . Sexual activity: Yes    Birth control/protection: None  Other Topics Concern  . Not on file  Social History Narrative  . Not on file           Mardella LaymanHagler, Jarielys Girardot, MD 06/23/17 1453

## 2017-06-28 ENCOUNTER — Other Ambulatory Visit: Payer: Self-pay

## 2017-06-28 ENCOUNTER — Inpatient Hospital Stay (HOSPITAL_COMMUNITY)
Admission: AD | Admit: 2017-06-28 | Discharge: 2017-06-28 | Disposition: A | Discharge status: Home / Self Care | Payer: Medicaid Other | Source: Ambulatory Visit | Attending: Obstetrics and Gynecology | Admitting: Obstetrics and Gynecology

## 2017-06-28 ENCOUNTER — Encounter (HOSPITAL_COMMUNITY): Payer: Self-pay | Admitting: *Deleted

## 2017-06-28 DIAGNOSIS — A630 Anogenital (venereal) warts: Secondary | ICD-10-CM | POA: Diagnosis not present

## 2017-06-28 DIAGNOSIS — F17210 Nicotine dependence, cigarettes, uncomplicated: Secondary | ICD-10-CM | POA: Insufficient documentation

## 2017-06-28 DIAGNOSIS — Z9889 Other specified postprocedural states: Secondary | ICD-10-CM

## 2017-06-28 DIAGNOSIS — N888 Other specified noninflammatory disorders of cervix uteri: Secondary | ICD-10-CM

## 2017-06-28 DIAGNOSIS — N939 Abnormal uterine and vaginal bleeding, unspecified: Secondary | ICD-10-CM | POA: Insufficient documentation

## 2017-06-28 DIAGNOSIS — N870 Mild cervical dysplasia: Secondary | ICD-10-CM | POA: Diagnosis not present

## 2017-06-28 DIAGNOSIS — D649 Anemia, unspecified: Secondary | ICD-10-CM | POA: Diagnosis not present

## 2017-06-28 HISTORY — DX: Mild cervical dysplasia: N87.0

## 2017-06-28 HISTORY — DX: Moderate cervical dysplasia: N87.1

## 2017-06-28 LAB — CBC
HEMATOCRIT: 32.9 % — AB (ref 36.0–46.0)
HEMOGLOBIN: 10.4 g/dL — AB (ref 12.0–15.0)
MCH: 22.8 pg — AB (ref 26.0–34.0)
MCHC: 31.6 g/dL (ref 30.0–36.0)
MCV: 72 fL — ABNORMAL LOW (ref 78.0–100.0)
Platelets: 340 10*3/uL (ref 150–400)
RBC: 4.57 MIL/uL (ref 3.87–5.11)
RDW: 17.9 % — ABNORMAL HIGH (ref 11.5–15.5)
WBC: 10.4 10*3/uL (ref 4.0–10.5)

## 2017-06-28 LAB — HCG, SERUM, QUALITATIVE: Preg, Serum: NEGATIVE

## 2017-06-28 NOTE — MAU Note (Addendum)
Pt reports vaginal bleeding for 6 months. Tonight pt has had increased bleeding w/ large clots. Pt has an appointment on 3/13 in clinic

## 2017-06-28 NOTE — Discharge Instructions (Signed)

## 2017-06-28 NOTE — MAU Provider Note (Signed)
History     CSN: [id number]  Arrival date and time: 06/28/17 0159   First Provider Initiated Contact with Patient 06/28/17 0254      Chief Complaint  Patient presents with  . Vaginal Bleeding   HPI Julie Hendrix is a 32 y.o. non pregnant female who presents with vaginal bleeding. Reports daily bleeding since September. Had depo provera injection in October to treat symptoms but reports that it didn't help so she didn't get any further injections. Had cryotherapy on cervix performed by Dr. Doroteo Glassman on 06/08/17 for CIN I & II. States she was told to wait 2 weeks before having intercourse, which she did, then had IC this past Thursday.  Her bleeding worsened significantly this evening. Reports she was at a friends house and was sitting on the bed, when she stood up she passed a grapefruit sized blood clot. States she has continued to bleed and pass clots since then. Denies abdominal pain, dizziness, CP, or SOB.   Has a f/u appointment with Dr. Doroteo Glassman scheduled for Wednesday.    Past Medical History:  Diagnosis Date  . Abnormal Pap smear   . 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 Dysplasia of cervix, low grade (CIN 1)   . HPV (human papilloma virus) anogenital infection     Past Surgical History:  Procedure Laterality Date  . COLPOSCOPY    . CRYOTHERAPY  06/08/2017   cervix    Family History  Problem Relation Age of Onset  . Hypertension Father   . Diabetes Father   . Heart attack Father   . Hypertension Mother     Social History   Tobacco Use  . Smoking status: Current Every Day Smoker    Packs/day: 1.00  . Smokeless tobacco: Never Used  Substance Use Topics  . Alcohol use: Yes    Comment: occasionally  . Drug use: No    Allergies:  Allergies  Allergen Reactions  . Latex Rash    Medications Prior to Admission  Medication Sig Dispense Refill Last Dose  . megestrol (MEGACE) 40 MG tablet Take 1 tablet (40 mg total) by  mouth 2 (two) times daily. 60 tablet 0 Past Month at Unknown time  . methocarbamol (ROBAXIN) 500 MG tablet Take 1 tablet (500 mg total) by mouth 2 (two) times daily. (Patient not taking: Reported on 06/08/2017) 20 tablet 0 Not Taking  . metroNIDAZOLE (FLAGYL) 500 MG tablet Take 1 tablet (500 mg total) by mouth 2 (two) times daily. 14 tablet 0     Review of Systems  Constitutional: Negative.   Cardiovascular: Negative for palpitations.  Gastrointestinal: Negative for abdominal pain.  Genitourinary: Positive for vaginal bleeding.  Neurological: Negative for dizziness and headaches.   Physical Exam   Blood pressure 129/71, pulse 107, temperature 98.3 F (36.8 C), temperature source Oral, resp. rate 18, weight 259 lb (117.5 kg).  Physical Exam  Nursing note and vitals reviewed. Constitutional: She is oriented to person, place, and time. She appears well-developed and well-nourished. No distress.  HENT:  Head: Normocephalic and atraumatic.  Eyes: Conjunctivae are normal. Right eye exhibits no discharge. Left eye exhibits no discharge. No scleral icterus.  Neck: Normal range of motion.  Cardiovascular: Normal rate, regular rhythm and normal heart sounds.  No murmur heard. Respiratory: Effort normal and breath sounds normal. No respiratory distress. She has no wheezes.  GI: Soft. There is no tenderness.  Genitourinary: Cervix exhibits friability.  Genitourinary Comments: Area of constant bleeding  at ~ 6 o'clock near area tx by cryo. Minimal dark red/brown mucoid blood coming from os.  Silver nitrate applied to area of bleeding cervix; bleeding stopped.   Neurological: She is alert and oriented to person, place, and time.  Skin: Skin is warm and dry. She is not diaphoretic.  Psychiatric: She has a normal mood and affect. Her behavior is normal. Judgment and thought content normal.    MAU Course  Procedures Results for orders placed or performed during the hospital encounter of 06/28/17  (from the past 24 hour(s))  CBC     Status: Abnormal   Collection Time: 06/28/17  3:00 AM  Result Value Ref Range   WBC 10.4 4.0 - 10.5 K/uL   RBC 4.57 3.87 - 5.11 MIL/uL   Hemoglobin 10.4 (L) 12.0 - 15.0 g/dL   HCT 95.232.9 (L) 84.136.0 - 32.446.0 %   MCV 72.0 (L) 78.0 - 100.0 fL   MCH 22.8 (L) 26.0 - 34.0 pg   MCHC 31.6 30.0 - 36.0 g/dL   RDW 40.117.9 (H) 02.711.5 - 25.315.5 %   Platelets 340 150 - 400 K/uL  hCG, serum, qualitative     Status: None   Collection Time: 06/28/17  3:00 AM  Result Value Ref Range   Preg, Serum NEGATIVE NEGATIVE    MDM Unable to give urine sample d/t bleeding -- HCG qual negative CBC -- hemoglobin 10.4; was 11.5 in November --- recommended OTC iron supplement to patient VSS, NAD Increase in bleeding likely cervical vs uterine. Bleeding improved after applying silver nitrate to cervix. Patient to keep scheduled appointment with Dr. Doroteo GlassmanPhelps on Wednesday.   Assessment and Plan  A; 1. Bleeding of cervix   2. History of cryosurgery   3. Abnormal uterine bleeding (AUB)   4. Anemia, unspecified type    P: Discharge home OTC iron supplement to tx anemia Pelvic rest Discussed reasons to return to MAU Keep appt on Wednesday  Julie Hendrix 06/28/2017, 2:54 AM

## 2017-06-30 ENCOUNTER — Ambulatory Visit (INDEPENDENT_AMBULATORY_CARE_PROVIDER_SITE_OTHER): Payer: Medicaid Other | Admitting: Obstetrics and Gynecology

## 2017-06-30 VITALS — BP 123/59 | HR 85 | Ht 63.0 in | Wt 262.7 lb

## 2017-06-30 DIAGNOSIS — N939 Abnormal uterine and vaginal bleeding, unspecified: Secondary | ICD-10-CM

## 2017-06-30 DIAGNOSIS — Z9889 Other specified postprocedural states: Secondary | ICD-10-CM

## 2017-07-04 NOTE — Progress Notes (Signed)
   GYNECOLOGY OFFICE VISIT NOTE  History:  32 y.o. [id number] here today for:  Follow-up cryotherapy: Patient had to go to the MAU earlier this week due to heavy bleeding. She was having large clots. Had recently had sex. Had "treatment" in MAU to stop the bleeding. No bleeding since.   Irregular menstraul bleeding: States she has been having irregular bleeding for years. Has recently had bleeding for 6 months that was constant. Stopped after MAU visit. Menstraul periods have always been long and heavy with clots. Has tried Depo with no improvement. Not currently on contraception because she wishes to get pregnant. Has been using Megace off and on for when she has heavy bleeding.   Current smoker  She denies any abnormal vaginal discharge, bleeding, pelvic pain or other concerns.   Past Medical History:  Diagnosis Date  . Abnormal Pap smear   . 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 Dysplasia of cervix, low grade (CIN 1)   . HPV (human papilloma virus) anogenital infection     Past Surgical History:  Procedure Laterality Date  . COLPOSCOPY    . CRYOTHERAPY  06/08/2017   cervix    Health Maintenance:  Up to date.   Review of Systems:  Pertinent items noted in HPI and remainder of comprehensive ROS otherwise negative.   Objective:  Physical Exam BP (!) 123/59   Pulse 85   Ht 5\' 3"  (1.6 m)   Wt 262 lb 11.2 oz (119.2 kg)   BMI 46.54 kg/m  Constitutional: She is oriented to person, place, and time. She appears well-developed and well-nourished. No distress.  Neck: Normal range of motion.  Cardiovascular: Normal rate, regular rhythm and normal heart sounds.  No murmur heard. Respiratory: Effort normal and breath sounds normal. No respiratory distress. She has no wheezes.  GI: Soft. There is no tenderness. No distention. Genitourinary: Cervix exhibits friability. No active bleeding. Healing well. No abnormal vaginal discharge. No  blood in vaginal vault or from cervical os.  Neurological: She is alert and oriented to person, place, and time.  Skin: Skin is warm and dry. She is not diaphoretic.  Psychiatric: She has a normal mood and affect. Her behavior is normal. Judgment and thought content normal.   Labs and Imaging No results found.  Assessment & Plan:  1. Abnormal uterine bleeding Not currently bleeding. Patient trying to get pregnant. Will delay use of contraceptives to help with AUB. Patient to follow-up in 6 months if not pregnant or sooner if she wants to have further work-up or treatment of AUB. Can continue Megace prn but discussed proper use of medication.   2. History of cryosurgery Healing well. No concerns. Repeat pap in 6 months.    Caryl AdaJazma Bode Pieper, DO OB Fellow [facility], [facility]

## 2017-08-09 ENCOUNTER — Encounter: Payer: Self-pay | Admitting: Obstetrics and Gynecology

## 2017-08-10 ENCOUNTER — Other Ambulatory Visit: Payer: Self-pay

## 2017-08-10 ENCOUNTER — Encounter (HOSPITAL_COMMUNITY): Payer: Self-pay | Admitting: Emergency Medicine

## 2017-08-10 ENCOUNTER — Ambulatory Visit (HOSPITAL_COMMUNITY)
Admission: EM | Admit: 2017-08-10 | Discharge: 2017-08-10 | Disposition: A | Discharge status: Home / Self Care | Payer: Medicaid Other | Attending: Family Medicine | Admitting: Family Medicine

## 2017-08-10 DIAGNOSIS — K2900 Acute gastritis without bleeding: Secondary | ICD-10-CM | POA: Diagnosis not present

## 2017-08-10 LAB — POCT URINALYSIS DIP (DEVICE)
BILIRUBIN URINE: NEGATIVE
Glucose, UA: NEGATIVE mg/dL
HGB URINE DIPSTICK: NEGATIVE
KETONES UR: NEGATIVE mg/dL
Leukocytes, UA: NEGATIVE
Nitrite: NEGATIVE
PH: 5.5 (ref 5.0–8.0)
Protein, ur: NEGATIVE mg/dL
SPECIFIC GRAVITY, URINE: 1.025 (ref 1.005–1.030)
Urobilinogen, UA: 0.2 mg/dL (ref 0.0–1.0)

## 2017-08-10 LAB — POCT PREGNANCY, URINE: PREG TEST UR: NEGATIVE

## 2017-08-10 MED ORDER — SUCRALFATE 1 G PO TABS: 1.0000 g | ORAL_TABLET | Freq: Three times a day (TID) | ORAL | 0 refills | Status: DC

## 2017-08-10 MED ORDER — OMEPRAZOLE 20 MG PO CPDR: 20.0000 mg | DELAYED_RELEASE_CAPSULE | Freq: Every day | ORAL | 0 refills | Status: DC

## 2017-08-10 NOTE — Discharge Instructions (Signed)
Please begin omeprazole daily for the next 3-4 weeks.  Please begin Carafate, please take before meals and at bedtime.  Please also use this for the next 3-4 weeks.  Please follow-up here with gastroenterology if symptoms not improving in 2-3 weeks.  Please return sooner or go to emergency room if symptoms worsening, worsening abdominal pain, not able to eat or drink.

## 2017-08-10 NOTE — ED Triage Notes (Signed)
C/o epigastric pain onset one week, cramping that comes and goes and diarrhea

## 2017-08-10 NOTE — ED Provider Notes (Signed)
[facility]    CSN: [id number] Arrival date & time: 08/10/17  1139     History   Chief Complaint Chief Complaint  Patient presents with  . Abdominal Pain    HPI Julie Hendrix is a 32 y.o. female no contributing past medical history presenting today with abdominal pain.  States that last week she developed a cramping sensation that worsens whenever she eats.  She also is having some loose diarrhea, but she initially attributed this to drinking.  She notes that last week she drank a lot.  Her grandmother recently passed away and her family lists reuniting and celebrating her life.  Notes that her pain will come and go, but usually worsens and has a dull aching sensation after eating as well as worsening at night.  She does drink water today and has not had any solid foods and has not had any pain today.  She has had some mild nausea, but denies vomiting.  Bowel movements are returning to normal.  Denies any hematochezia or melena.  Denies any shortness of breath or chest pain.  Denies any sensation of chest burning or sour taste in her mouth.  HPI  Past Medical History:  Diagnosis Date  . Abnormal Pap smear   . 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 Dysplasia of cervix, low grade (CIN 1)   . HPV (human papilloma virus) anogenital infection     Patient Active Problem List   Diagnosis Date Noted  . Low grade squamous intraepithelial lesion on cytologic smear of cervix (LGSIL) 05/21/2017  . Breast lump on right side at 10 o'clock position 01/31/2013  . Abnormal uterine bleeding 09/08/2012    Past Surgical History:  Procedure Laterality Date  . COLPOSCOPY    . CRYOTHERAPY  06/08/2017   cervix    OB History    Gravida  3   Para  2   Term  2   Preterm  0   AB  1   Living  2     SAB  0   TAB  1   Ectopic  0   Multiple  0   Live Births  2            Home Medications    Prior to Admission  medications   Medication Sig Start Date End Date Taking? Authorizing Provider  omeprazole (PRILOSEC) 20 MG capsule Take 1 capsule (20 mg total) by mouth daily for 28 days. 08/10/17 09/07/17  Kwanza Cancelliere C, PA-C  sucralfate (CARAFATE) 1 g tablet Take 1 tablet (1 g total) by mouth 4 (four) times daily -  with meals and at bedtime for 28 days. 08/10/17 09/07/17  Laressa Bolinger, Junius CreamerHallie C, PA-C    Family History Family History  Problem Relation Age of Onset  . Hypertension Father   . Diabetes Father   . Heart attack Father   . Hypertension Mother     Social History Social History   Tobacco Use  . Smoking status: Current Every Day Smoker    Packs/day: 1.00  . Smokeless tobacco: Never Used  Substance Use Topics  . Alcohol use: Yes    Comment: occasionally  . Drug use: No     Allergies   Latex   Review of Systems Review of Systems  Constitutional: Negative for fatigue and fever.  HENT: Negative for congestion and sore throat.   Eyes: Negative for visual disturbance.  Respiratory: Negative for cough and shortness of  breath.   Cardiovascular: Negative for chest pain.  Gastrointestinal: Positive for abdominal pain and nausea. Negative for blood in stool, constipation, diarrhea and vomiting.  Genitourinary: Negative for dysuria, frequency, menstrual problem, urgency, vaginal bleeding and vaginal discharge.  Musculoskeletal: Negative for back pain and myalgias.  Skin: Negative for color change and rash.  Neurological: Negative for dizziness, weakness, light-headedness and headaches.     Physical Exam Triage Vital Signs ED Triage Vitals  Enc Vitals Group     BP 08/10/17 1233 (!) 166/102     Pulse Rate 08/10/17 1233 74     Resp --      Temp 08/10/17 1233 98.2 F (36.8 C)     Temp src --      SpO2 08/10/17 1233 100 %     Weight --      Height --      Head Circumference --      Peak Flow --      Pain Score 08/10/17 1231 0     Pain Loc --      Pain Edu? --      Excl. in GC?  --    No data found.  Updated Vital Signs BP (!) 166/102 (BP Location: Left Wrist)   Pulse 74   Temp 98.2 F (36.8 C)   SpO2 100%   Visual Acuity Right Eye Distance:   Left Eye Distance:   Bilateral Distance:    Right Eye Near:   Left Eye Near:    Bilateral Near:     Physical Exam  Constitutional: She is oriented to person, place, and time. She appears well-developed and well-nourished. No distress.  Sitting comfortably in chair, talking on phone, moves from chair to exam table without pain or issue.  HENT:  Head: Normocephalic and atraumatic.  Eyes: Conjunctivae are normal.  Neck: Neck supple.  Cardiovascular: Normal rate and regular rhythm.  No murmur heard. Pulmonary/Chest: Effort normal and breath sounds normal. No respiratory distress.  Abdominal: Soft. There is tenderness.  Abdomen is soft, nondistended, striae present diffusely across abdomen.  Patient with generalized diffuse tenderness, negative rebound, negative Murphy's, negative Rovsing's, negative McBurney's.  Patient does not guard during exam.  Musculoskeletal: She exhibits no edema.  Neurological: She is alert and oriented to person, place, and time. No cranial nerve deficit.  Skin: Skin is warm and dry.  Psychiatric: She has a normal mood and affect.  Nursing note and vitals reviewed.    UC Treatments / Results  Labs (all labs ordered are listed, but only abnormal results are displayed) Labs Reviewed  POCT URINALYSIS DIP (DEVICE)    EKG None Radiology No results found.  Procedures Procedures (including critical care time)  Medications Ordered in UC Medications - No data to display   Initial Impression / Assessment and Plan / UC Course  I have reviewed the triage vital signs and the nursing notes.  Pertinent labs & imaging results that were available during my care of the patient were reviewed by me and considered in my medical decision making (see chart for details).     Patient with  possible duodenal ulcer versus alcohol induced gastritis.  Will treat with omeprazole daily as well as Carafate.  Will have patient follow-up here with gastroenterology if symptoms persisting.  Follow-up here sooner if symptoms worsening or changing.  Final Clinical Impressions(s) / UC Diagnoses   Final diagnoses:  Acute gastritis without hemorrhage, unspecified gastritis type    ED Discharge Orders  Ordered    sucralfate (CARAFATE) 1 g tablet  3 times daily with meals & bedtime     08/10/17 1308    omeprazole (PRILOSEC) 20 MG capsule  Daily     08/10/17 1308       Controlled Substance Prescriptions [place] Controlled Substance Registry consulted? Not Applicable   Lew Dawes, [place] 08/10/17 1315

## 2017-08-10 NOTE — ED Triage Notes (Signed)
C/o pain gets worse at PM, and Sx's are worse after eat, stomach bloated

## 2017-09-29 ENCOUNTER — Ambulatory Visit (HOSPITAL_COMMUNITY)
Admission: EM | Admit: 2017-09-29 | Discharge: 2017-09-29 | Disposition: A | Discharge status: Home / Self Care | Payer: Medicaid Other | Attending: Family Medicine | Admitting: Family Medicine

## 2017-09-29 ENCOUNTER — Encounter (HOSPITAL_COMMUNITY): Payer: Self-pay | Admitting: Emergency Medicine

## 2017-09-29 DIAGNOSIS — H1032 Unspecified acute conjunctivitis, left eye: Secondary | ICD-10-CM | POA: Diagnosis not present

## 2017-09-29 MED ORDER — SULFACETAMIDE SODIUM 10 % OP SOLN: 1.0000 [drp] | Freq: Four times a day (QID) | OPHTHALMIC | 0 refills | Status: AC

## 2017-09-29 NOTE — Discharge Instructions (Signed)
Use sulfacetamide eyedrops as directed on left eye. Lid scrubs and warm compresses as directed. Monitor for any worsening of symptoms, changes in vision, sensitivity to light, eye swelling, painful eye movement, follow up with ophthalmology for further evaluation.
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
## 2017-09-29 NOTE — ED Triage Notes (Signed)
Pt here for left eye irritation

## 2017-10-02 ENCOUNTER — Other Ambulatory Visit: Payer: Self-pay

## 2017-10-02 ENCOUNTER — Ambulatory Visit (HOSPITAL_COMMUNITY)
Admission: EM | Admit: 2017-10-02 | Discharge: 2017-10-02 | Disposition: A | Discharge status: Home / Self Care | Payer: Medicaid Other | Attending: Family Medicine | Admitting: Family Medicine

## 2017-10-02 ENCOUNTER — Encounter (HOSPITAL_COMMUNITY): Payer: Self-pay

## 2017-10-02 DIAGNOSIS — H1032 Unspecified acute conjunctivitis, left eye: Secondary | ICD-10-CM | POA: Diagnosis not present

## 2017-10-02 MED ORDER — TETRACAINE HCL 0.5 % OP SOLN
OPHTHALMIC | Status: AC
  Filled 2017-10-02: qty 4

## 2017-10-02 MED ORDER — OLOPATADINE HCL 0.1 % OP SOLN: 1.0000 [drp] | Freq: Two times a day (BID) | OPHTHALMIC | 0 refills | Status: DC

## 2017-10-02 NOTE — ED Triage Notes (Signed)
Pt presents today with left eye problem. States she was seen on Wednesday for this issue and was given eye drops. States that she thinks her eye has gotten worse. Has more clear drainage coming out. Starting to be painful when she closes it and is causing headaches.

## 2017-10-02 NOTE — Discharge Instructions (Signed)
Please continue antibiotic drops  May add in Patanol eye drops  Follow up with ophthalmology if symptoms not improving  Please return if developing worsening pain, changes in vision, swelling.
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
## 2017-10-20 ENCOUNTER — Encounter: Payer: Self-pay | Admitting: Obstetrics and Gynecology

## 2017-10-24 ENCOUNTER — Ambulatory Visit (HOSPITAL_COMMUNITY)
Admission: EM | Admit: 2017-10-24 | Discharge: 2017-10-24 | Disposition: A | Discharge status: Home / Self Care | Payer: Medicaid Other | Attending: Family Medicine | Admitting: Family Medicine

## 2017-10-24 ENCOUNTER — Encounter (HOSPITAL_COMMUNITY): Payer: Self-pay | Admitting: Emergency Medicine

## 2017-10-24 ENCOUNTER — Other Ambulatory Visit: Payer: Self-pay

## 2017-10-24 DIAGNOSIS — R071 Chest pain on breathing: Secondary | ICD-10-CM | POA: Diagnosis not present

## 2017-10-24 MED ORDER — PREDNISONE 50 MG PO TABS: ORAL_TABLET | ORAL | 0 refills | Status: DC

## 2017-10-24 MED ORDER — IBUPROFEN 800 MG PO TABS: 800.0000 mg | ORAL_TABLET | Freq: Three times a day (TID) | ORAL | 0 refills | Status: DC

## 2017-10-24 NOTE — Discharge Instructions (Signed)
Take prednisone once a day for 5 days.  Start today After prednisone start ibuprofen 800 mg 3 times a day as needed for pain Drink plenty of fluids I agree with your decision to try to stop smoking

## 2017-10-24 NOTE — ED Triage Notes (Signed)
[facility] a [facility] of left sided chest pain under her breast x 3 days. The patient stated that the pain increases with inspiration and movement.

## 2017-10-24 NOTE — ED Provider Notes (Signed)
[facility]    CSN: [id number] Arrival date & time: 10/24/17  1634     History   Chief Complaint Chief Complaint  Patient presents with  . Chest Pain    HPI Julie Hendrix is a 32 y.o. female.   HPI Patient is here for chest pain.  Present for 3 days.  It hurts underneath her left breast.  Hurts with a deep breath.  She has had a mild cough.  She smokes a pack a day.  No fever or chills.  No sputum production.  No fatigue or malaise.  No nausea or vomiting.  No known exposure to respiratory infection.  Past Medical History:  Diagnosis Date  . Abnormal Pap smear   . 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 Dysplasia of cervix, low grade (CIN 1)   . HPV (human papilloma virus) anogenital infection     Patient Active Problem List   Diagnosis Date Noted  . Low grade squamous intraepithelial lesion on cytologic smear of cervix (LGSIL) 05/21/2017  . Breast lump on right side at 10 o'clock position 01/31/2013  . Abnormal uterine bleeding 09/08/2012    Past Surgical History:  Procedure Laterality Date  . COLPOSCOPY    . CRYOTHERAPY  06/08/2017   cervix    OB History    Gravida  3   Para  2   Term  2   Preterm  0   AB  1   Living  2     SAB  0   TAB  1   Ectopic  0   Multiple  0   Live Births  2            Home Medications    Prior to Admission medications   Medication Sig Start Date End Date Taking? Authorizing Provider  ibuprofen (ADVIL,MOTRIN) 800 MG tablet Take 1 tablet (800 mg total) by mouth 3 (three) times daily. 10/24/17   Eustace MooreNelson, Rayley Gao Sue, MD  predniSONE (DELTASONE) 50 MG tablet One po q day 10/24/17   Eustace MooreNelson, Ranette Luckadoo Sue, MD    Family History Family History  Problem Relation Age of Onset  . Hypertension Father   . Diabetes Father   . Heart attack Father   . Hypertension Mother     Social History Social History   Tobacco Use  . Smoking status: Current Every Day Smoker   Packs/day: 1.00  . Smokeless tobacco: Never Used  Substance Use Topics  . Alcohol use: Yes    Comment: occasionally  . Drug use: No     Allergies   Latex   Review of Systems Review of Systems  Constitutional: Negative for chills, diaphoresis, fatigue and fever.  HENT: Negative for ear pain and sore throat.   Eyes: Negative for pain and visual disturbance.  Respiratory: Negative for cough and shortness of breath.   Cardiovascular: Positive for chest pain. Negative for palpitations.  Gastrointestinal: Negative for abdominal pain, nausea and vomiting.  Genitourinary: Negative for dysuria and hematuria.  Musculoskeletal: Negative for arthralgias and back pain.  Skin: Negative for color change and rash.  Neurological: Negative for seizures, syncope and headaches.  Psychiatric/Behavioral: Negative for dysphoric mood. The patient is not nervous/anxious.   All other systems reviewed and are negative.    Physical Exam Triage Vital Signs ED Triage Vitals [10/24/17 1657]  Enc Vitals Group     BP (!) 142/92     Pulse Rate 84  Resp 20     Temp 97.8 F (36.6 C)     Temp Source Oral     SpO2 100 %     Weight      Height      Head Circumference      Peak Flow      Pain Score 8     Pain Loc      Pain Edu?      Excl. in GC?    No data found.  Updated Vital Signs BP (!) 142/92 (BP Location: Left Arm)   Pulse 84   Temp 97.8 F (36.6 C) (Oral)   Resp 20   SpO2 100%   Visual Acuity    Physical Exam  Constitutional: She is oriented to person, place, and time. She appears well-developed and well-nourished. No distress.  HENT:  Head: Normocephalic and atraumatic.  Mouth/Throat: Oropharynx is clear and moist.  Eyes: Pupils are equal, round, and reactive to light. Conjunctivae are normal.  Neck: Normal range of motion.  Cardiovascular: Normal rate, regular rhythm and normal pulses.    No systolic murmur is present. No chest wall tenderness  Pulmonary/Chest: Effort  normal and breath sounds normal. No respiratory distress.  Abdominal: Soft. She exhibits no distension. There is no tenderness.  Musculoskeletal: Normal range of motion. She exhibits no edema.       Right lower leg: Normal. She exhibits no edema.       Left lower leg: Normal. She exhibits no edema.  Lymphadenopathy:    She has no cervical adenopathy.  Neurological: She is alert and oriented to person, place, and time.  Skin: Skin is warm and dry.  Psychiatric: She has a normal mood and affect. Her behavior is normal.     UC Treatments / Results  Labs (all labs ordered are listed, but only abnormal results are displayed) Labs Reviewed - No data to display  EKG-normal  Radiology No results found.  Procedures Procedures (including critical care time)  Medications Ordered in UC Medications - No data to display  Initial Impression / Assessment and Plan / UC Course  I have reviewed the triage vital signs and the nursing notes.  Pertinent labs & imaging results that were available during my care of the patient were reviewed by me and considered in my medical decision making (see chart for details).     Discussed differential diagnosis of chest pain.  Discussed chest wall pain, pleurisy, lung pain, gastrointestinal pain, heart pain.  I reassured her that she does not have cardiac chest pain.  I believe is more of a pleurisy.  Is probably related to her smoking.  She is encouraged to quit smoking. Final Clinical Impressions(s) / UC Diagnoses   Final diagnoses:  Chest pain on breathing     Discharge Instructions     Take prednisone once a day for 5 days.  Start today After prednisone start ibuprofen 800 mg 3 times a day as needed for pain Drink plenty of fluids I agree with your decision to try to stop smoking    ED Prescriptions    Medication Sig Dispense Auth. Provider   predniSONE (DELTASONE) 50 MG tablet One po q day 5 tablet Eustace Moore, MD   ibuprofen  (ADVIL,MOTRIN) 800 MG tablet Take 1 tablet (800 mg total) by mouth 3 (three) times daily. 21 tablet Eustace Moore, MD     Controlled Substance Prescriptions [place] Controlled Substance Registry consulted? Not Applicable   Eustace Moore, MD  10/24/17 1850  

## 2017-10-29 ENCOUNTER — Encounter: Payer: Self-pay | Admitting: Obstetrics and Gynecology

## 2017-10-29 ENCOUNTER — Ambulatory Visit (INDEPENDENT_AMBULATORY_CARE_PROVIDER_SITE_OTHER): Payer: Medicaid Other | Admitting: Obstetrics and Gynecology

## 2017-10-29 ENCOUNTER — Other Ambulatory Visit (HOSPITAL_COMMUNITY)
Admission: RE | Admit: 2017-10-29 | Discharge: 2017-10-29 | Disposition: A | Discharge status: Home / Self Care | Payer: Medicaid Other | Source: Ambulatory Visit | Attending: Obstetrics and Gynecology | Admitting: Obstetrics and Gynecology

## 2017-10-29 VITALS — BP 148/81 | HR 80 | Ht 63.0 in | Wt 260.8 lb

## 2017-10-29 DIAGNOSIS — Z3009 Encounter for other general counseling and advice on contraception: Secondary | ICD-10-CM | POA: Diagnosis not present

## 2017-10-29 DIAGNOSIS — N898 Other specified noninflammatory disorders of vagina: Secondary | ICD-10-CM

## 2017-10-29 DIAGNOSIS — R350 Frequency of micturition: Secondary | ICD-10-CM | POA: Diagnosis not present

## 2017-10-29 LAB — POCT URINALYSIS DIP (DEVICE)
BILIRUBIN URINE: NEGATIVE
Glucose, UA: NEGATIVE mg/dL
Hgb urine dipstick: NEGATIVE
KETONES UR: NEGATIVE mg/dL
Leukocytes, UA: NEGATIVE
Nitrite: NEGATIVE
PH: 5.5 (ref 5.0–8.0)
Protein, ur: NEGATIVE mg/dL
SPECIFIC GRAVITY, URINE: 1.025 (ref 1.005–1.030)
Urobilinogen, UA: 0.2 mg/dL (ref 0.0–1.0)

## 2017-10-29 NOTE — Progress Notes (Signed)
C/o vaginal odor and pain. Last unprotected intercourse last week.

## 2017-10-29 NOTE — Progress Notes (Signed)
GYNECOLOGY OFFICE VISIT NOTE  History:  32 y.o. Julie Hendrix, Julie Hendrix, a  [id number] w. 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 nonradiating suprapubic pain, 2. A four month history of amenorrhea, and 3. A two week long yellow-brown malodorous vaginal discharge.  1. Pt endorses non-radiating suprapubic pain that began two weeks ago and has gotten progressively better.  Pt reports that pain is 5/10  when it does occurs and that 1 pill from a short course of prednisone helped improve symptoms.  Pt reports that she has not experienced pain in the last few days and that pain  occurs sporadically. Pt also reports that she will sometimes experience a sharp pain in her side that keeps her from inhaling deeply.  Pt reports the sharp pain in her side  is even more sporadic than the non-radiating suprapubic pain and nothing makes it better or worse.   2. Pt reports 4 months of amennorhea.    3. Pt endorses yellow-brown "wet" malodorous discharge that started a few weeks ago.  Pt reports that discharge is malodorous in a similar fashion to a previous bout of bacterial vaginosis she recently experienced.  Pt expressed that she is interested in receiving a prescription for vaginal cream to treat BV flare ups. Pt also reports associated symptoms of frequency.   Pt denies fever, chills, shortness of breath, and chest pain. Pt reports that she is sexually active and would like to conceive.     Past Medical History:  Diagnosis Date  . Abnormal Pap smear   . 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 Dysplasia of cervix, low grade (CIN 1)   . HPV (human papilloma virus) anogenital infection     Past Surgical History:  Procedure Laterality Date  . COLPOSCOPY    . CRYOTHERAPY  06/08/2017   cervix    The following portions of the patient's  history were reviewed and updated as appropriate: allergies, current medications, past family history, past medical history, past social history, past surgical history and problem list.   Health Maintenance:  Abnormal pap; HPV positive w. LSL epithelial cell abnormality, low-grade squamous intraepithelial lesion (LGSIL) on 26 January 2017   Review of Systems:  Pertinent items noted in HPI and remainder of comprehensive ROS otherwise negative.   Objective:  Physical Exam BP (!) 148/81   Pulse 80   Ht 5\' 3"  (1.6 m)   Wt 260 lb 12.8 oz (118.3 kg)   LMP  (LMP Unknown)   BMI 46.20 kg/m  CONSTITUTIONAL: Well-developed, well-nourished obese female in no acute distress.  SKIN: Skin is warm and dry. Hyperpigmentation on abdomen noted. Not diaphoretic.  PSYCHIATRIC: Normal mood and affect. Normal behavior.  CARDIOVASCULAR: Normal heart rate noted RESPIRATORY: Effort and breath sounds normal, no problems with respiration noted ABDOMEN: Soft, no distention noted.  No tenderness to palpation  PELVIC: Normal appearing external genitalia; normal appearing vaginal mucosa and cervix.  Discharge noted; Bimanual exam performed by Dr. Doroteo GlassmanPhelps - normal uterine size, no other palpable masses, or uterine/adnexal tendernesss   Labs and Imaging Urine analysis is unremarkable.   Assessment & Plan:   1. Precordial Catch Syndrome Patient counseled to inspire deeply through pain, and that syndrome is relatively benign in nature.  2. Suprapubic Pain Bimanual Exam was unremarkable, pt is not currently in pain, and pt does  not experience pain with palpation; This complaint will be managed expectantly with regular annual f/u's (Repeat Pap w. Cytology @ next annual exam)  3. Vaginal Discharge Strongly suspect recurrence of bacterial vaginosis, however since patient is sexually active, we will also test for mcc's of vaginal discharge and commonly diagnosed sti's that produce discharge and would not want to miss.    -Wet Prep/Swab for gardnerella, candida , trichomoniasis  -Chalmydia/Ghonorrhea(std) blood test  4. Amenorrhea Dr. Doroteo Glassman counseled patients on the different forms of contraceptive for regulation of period and the risk factors and contraindications of use  (>30yo, smoker, etc) for estrogen containing contraceptives.  Informed decision making took place and pt agreed to placement of an  intrauterine device.    Routine preventative health maintenance measures emphasized.  Please refer to After Visit Summary for other counseling recommendations.   Return in about 2 weeks (around 11/12/2017) for IUD placement.   Total face-to-face time with patient: 25 minutes. Over 50% of encounter was spent on counseling and coordination of care.

## 2017-10-30 LAB — RPR: RPR Ser Ql: NONREACTIVE

## 2017-10-30 LAB — HIV ANTIBODY (ROUTINE TESTING W REFLEX): HIV Screen 4th Generation wRfx: NONREACTIVE

## 2017-11-02 ENCOUNTER — Other Ambulatory Visit: Payer: Self-pay | Admitting: Family Medicine

## 2017-11-02 ENCOUNTER — Encounter (INDEPENDENT_AMBULATORY_CARE_PROVIDER_SITE_OTHER): Payer: Self-pay

## 2017-11-02 LAB — CERVICOVAGINAL ANCILLARY ONLY
Bacterial vaginitis: POSITIVE — AB
CANDIDA VAGINITIS: NEGATIVE
Chlamydia: NEGATIVE
Neisseria Gonorrhea: NEGATIVE
Trichomonas: NEGATIVE

## 2017-11-02 MED ORDER — METRONIDAZOLE 500 MG PO TABS: 500.0000 mg | ORAL_TABLET | Freq: Two times a day (BID) | ORAL | 0 refills | Status: DC

## 2017-11-23 ENCOUNTER — Ambulatory Visit: Payer: Medicaid Other | Admitting: Nurse Practitioner

## 2017-11-30 ENCOUNTER — Encounter: Payer: Self-pay | Admitting: Nurse Practitioner

## 2017-11-30 ENCOUNTER — Ambulatory Visit (INDEPENDENT_AMBULATORY_CARE_PROVIDER_SITE_OTHER): Payer: Medicaid Other | Admitting: Nurse Practitioner

## 2017-11-30 VITALS — BP 132/89 | HR 72 | Wt 262.0 lb

## 2017-11-30 DIAGNOSIS — N939 Abnormal uterine and vaginal bleeding, unspecified: Secondary | ICD-10-CM

## 2017-11-30 DIAGNOSIS — Z6841 Body Mass Index (BMI) 40.0 and over, adult: Secondary | ICD-10-CM | POA: Insufficient documentation

## 2017-11-30 DIAGNOSIS — E282 Polycystic ovarian syndrome: Secondary | ICD-10-CM

## 2017-11-30 DIAGNOSIS — Z3009 Encounter for other general counseling and advice on contraception: Secondary | ICD-10-CM

## 2017-11-30 NOTE — Patient Instructions (Addendum)
See your primary care or the Health Department for you TDAP immunization which is overdue.  (every 10 years)  Polycystic Ovarian Syndrome Polycystic ovarian syndrome (PCOS) is a common hormonal disorder among women of reproductive age. In most women with PCOS, many small fluid-filled sacs (cysts) grow on the ovaries, and the cysts are not part of a normal menstrual cycle. PCOS can cause problems with your menstrual periods and make it difficult to get pregnant. It can also cause an increased risk of miscarriage with pregnancy. If it is not treated, PCOS can lead to serious health problems, such as diabetes and heart disease. What are the causes? The cause of PCOS is not known, but it may be the result of a combination of certain factors, such as:  Irregular menstrual cycle.  High levels of certain hormones (androgens).  Problems with the hormone that helps to control blood sugar (insulin resistance).  Certain genes.  What increases the risk? This condition is more likely to develop in women who have a family history of PCOS. What are the signs or symptoms? Symptoms of PCOS may include:  Multiple ovarian cysts.  Infrequent periods or no periods.  Periods that are too frequent or too heavy.  Unpredictable periods.  Inability to get pregnant (infertility) because of not ovulating.  Increased growth of hair on the face, chest, stomach, back, thumbs, thighs, or toes.  Acne or oily skin. Acne may develop during adulthood, and it may not respond to treatment.  Pelvic pain.  Weight gain or obesity.  Patches of thickened and dark brown or black skin on the neck, arms, breasts, or thighs (acanthosis nigricans).  Excess hair growth on the face, chest, abdomen, or upper thighs (hirsutism).  How is this diagnosed? This condition is diagnosed based on:  Your medical history.  A physical exam, including a pelvic exam. Your health care provider may look for areas of increased hair  growth on your skin.  Tests, such as: ? Ultrasound. This may be used to examine the ovaries and the lining of the uterus (endometrium) for cysts. ? Blood tests. These may be used to check levels of sugar (glucose), female hormone (testosterone), and female hormones (estrogen and progesterone) in your blood.  How is this treated? There is no cure for PCOS, but treatment can help to manage symptoms and prevent more health problems from developing. Treatment varies depending on:  Your symptoms.  Whether you want to have a baby or whether you need birth control (contraception).  Treatment may include nutrition and lifestyle changes along with:  Progesterone hormone to start a menstrual period.  Birth control pills to help you have regular menstrual periods.  Medicines to make you ovulate, if you want to get pregnant.  Medicine to reduce excessive hair growth.  Surgery, in severe cases. This may involve making small holes in one or both of your ovaries. This decreases the amount of testosterone that your body produces.  Follow these instructions at home:  Take over-the-counter and prescription medicines only as told by your health care provider.  Follow a healthy meal plan. This can help you reduce the effects of PCOS. ? Eat a healthy diet that includes lean proteins, complex carbohydrates, fresh fruits and vegetables, low-fat dairy products, and healthy fats. Make sure to eat enough fiber.  If you are overweight, lose weight as told by your health care provider. ? Losing 10% of your body weight may improve symptoms. ? Your health care provider can determine how much weight  loss is best for you and can help you lose weight safely.  Keep all follow-up visits as told by your health care provider. This is important. Contact a health care provider if:  Your symptoms do not get better with medicine.  You develop new symptoms. This information is not intended to replace advice given to  you by your health care provider. Make sure you discuss any questions you have with your health care provider. Document Released: 07/31/2004 Document Revised: 12/03/2015 Document Reviewed: 09/22/2015 Elsevier Interactive Patient Education  2018 [employer]  Diet for Polycystic Ovarian Syndrome Polycystic ovary syndrome (PCOS) is a disorder of the chemical messengers (hormones) that regulate menstruation. The condition causes important hormones to be out of balance. PCOS can:  Make your periods irregular or stop.  Cause cysts to develop on the ovaries.  Make it difficult to get pregnant.  Stop your body from responding to the effects of insulin (insulin resistance), which can lead to obesity and diabetes.  Changing what you eat can help manage PCOS and improve your health. It can help you lose weight and improve the way your body uses insulin. What is my plan?  Eat breakfast, lunch, and dinner plus two snacks every day.  Include protein in each meal and snack.  Choose whole grains instead of products made with refined flour.  Eat a variety of foods.  Exercise regularly as told by your health care provider. What do I need to know about this eating plan? If you are overweight or obese, pay attention to how many calories you eat. Cutting down on calories can help you lose weight. Work with your health care provider or dietitian to figure out how many calories you need each day. What foods can I eat? Grains Whole grains, such as whole wheat. Whole-grain breads, crackers, cereals, and pasta. Unsweetened oatmeal, bulgur, barley, quinoa, or brown rice. Corn or whole-wheat flour tortillas. Vegetables  Lettuce. Spinach. Peas. Beets. Cauliflower. Cabbage. Broccoli. Carrots. Tomatoes. Squash. Eggplant. Herbs. Peppers. Onions. Cucumbers. Brussels sprouts. Fruits Berries. Bananas. Apples. Oranges. Grapes. Papaya. Mango. Pomegranate. Kiwi. Grapefruit. Cherries. Meats and Other Protein  Sources Lean proteins, such as fish, chicken, beans, eggs, and tofu. Dairy Low-fat dairy products, such as skim milk, cheese sticks, and yogurt. Beverages Low-fat or fat-free drinks, such as water, low-fat milk, sugar-free drinks, and 100% fruit juice. Condiments Ketchup. Mustard. Barbecue sauce. Relish. Low-fat or fat-free mayonnaise. Fats and Oils Olive oil or canola oil. Walnuts and almonds. The items listed above may not be a complete list of recommended foods or beverages. Contact your dietitian for more options. What foods are not recommended? Foods high in calories or fat. Fried foods. Sweets. Products made from refined white flour, including white bread, pastries, white rice, and pasta. The items listed above may not be a complete list of foods and beverages to avoid. Contact your dietitian for more information. This information is not intended to replace advice given to you by your health care provider. Make sure you discuss any questions you have with your health care provider. Document Released: 07/29/2015 Document Revised: 09/12/2015 Document Reviewed: 04/18/2014 Elsevier Interactive Patient Education  2018 [employer]

## 2017-11-30 NOTE — Progress Notes (Signed)
Extra note.  Not needed.  See previous note for this visit.  Nolene BernheimERRI Marquest Gunkel, RN, MSN, NP-BC Nurse Practitioner, [facility], [facility] 11/30/2017 8:35 PM

## 2017-11-30 NOTE — Progress Notes (Addendum)
GYNECOLOGY OFFICE VISIT NOTE   History:  32 y.o. [id number] here today for IUD insertion. She denies any abnormal vaginal discharge, bleeding, pelvic pain or other concerns. Last unprotected intercourse was one week ago on Tuesday.    Her history is that she does not have regular periods.  She bled earlier this year for 6 months then stopped and has not had a period for 4 months.  She was trying to become pregnant and is looking for a way to regulate her cycles.  She thought the IUD could regulate her cycles.  Discussed PCOS with her as she has had irregular menses for 19 years.    Advised that the IUD could help control her excessive vaginal bleeding but that it would not "regulate" her cycles.  Currently she is broken up from her partner and she is not interested in having a baby right now.  Past Medical History:  Diagnosis Date  . Abnormal Pap smear   . 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 Dysplasia of cervix, low grade (CIN 1)   . HPV (human papilloma virus) anogenital infection     Past Surgical History:  Procedure Laterality Date  . COLPOSCOPY    . CRYOTHERAPY  06/08/2017   cervix    The following portions of the patient's history were reviewed and updated as appropriate: allergies, current medications, past family history, past medical history, past social history, past surgical history and problem list.   Health Maintenance:  Normal pap on 01-26-17.    Review of Systems:  Pertinent items noted in HPI and remainder of comprehensive ROS otherwise negative.  Objective:  Physical Exam BP 132/89   Pulse 72   Wt 262 lb (118.8 kg)   BMI 46.41 kg/m  CONSTITUTIONAL: Well-developed, well-nourished female in no acute distress.  HENT:  Normocephalic, atraumatic. External right and left ear normal.  EYES: Conjunctivae and EOM are normal. Pupils are equal, round.  No scleral icterus.  NECK: Normal range of motion, supple, SKIN: Skin is  warm and dry. No rash noted. Not diaphoretic. No erythema. No pallor. NEUROLOGIC: Alert and oriented to person, place, and time. Normal muscle tone coordination. No cranial nerve deficit noted. PSYCHIATRIC: Normal mood and affect. Normal behavior. Normal judgment and thought content. CARDIOVASCULAR: Normal heart rate noted RESPIRATORY: Effort normal, no problems with respiration noted  PELVIC: Deferred MUSCULOSKELETAL: Normal range of motion. No edema noted.  Labs and Imaging No results found.  Assessment & Plan:  1. Family planning counseling Reproductive life plan reviewed - not attempting to have a baby Not ready for IUD today due to unprotected intercourse. Return in one week  - no sex during this time for IUD insertion Will review this plan with MD this week to confirm  2. PCOS (polycystic ovarian syndrome) Reviewed symptoms - client had never heard this term - info in AVS  3. Morbid obesity with BMI of 40.0-44.9, adult (HCC) Weight loss encouraged to decrease risk of diabetes and hypertension which can occur with metabolic syndrome due to PCOS.   Routine preventative health maintenance measures emphasized. Please refer to After Visit Summary for other counseling recommendations.   Return in about 1 week (around 12/07/2017).   Total face-to-face time with patient: 15 minutes.  Over 50% of encounter was spent on counseling and coordination of care.  Nolene BernheimERRI Tianni Escamilla, RN, MSN, NP-BC Nurse Practitioner, [facility], [facility] 11/30/2017 7:14 PM   Consult with  Dr. Marice Potterove today about the plan of care for this client.  Advised that she needs an ultrasound and to follow up with MD to discuss ultrasound and management for her.  Ultrasound order entered.  MyChart message sent to patient.  Message sent to adm clinical staff.  Nolene BernheimERRI Damarien Nyman, RN, MSN, NP-BC Nurse Practitioner, [facility], [facility] 12/01/2017 4:05 PM

## 2017-12-01 NOTE — Addendum Note (Signed)
Addended by: Currie ParisBURLESON, TERRI L on: 12/01/2017 04:15 PM   Modules accepted: Orders

## 2017-12-02 ENCOUNTER — Ambulatory Visit (HOSPITAL_COMMUNITY)
Admission: EM | Admit: 2017-12-02 | Discharge: 2017-12-02 | Disposition: A | Discharge status: Home / Self Care | Payer: Self-pay | Attending: Family Medicine | Admitting: Family Medicine

## 2017-12-02 ENCOUNTER — Encounter (HOSPITAL_COMMUNITY): Payer: Self-pay | Admitting: Emergency Medicine

## 2017-12-02 DIAGNOSIS — G44209 Tension-type headache, unspecified, not intractable: Secondary | ICD-10-CM

## 2017-12-02 MED ORDER — IBUPROFEN 800 MG PO TABS: 800.0000 mg | ORAL_TABLET | Freq: Three times a day (TID) | ORAL | 0 refills | Status: DC

## 2017-12-02 MED ORDER — CYCLOBENZAPRINE HCL 5 MG PO TABS: 5.0000 mg | ORAL_TABLET | Freq: Three times a day (TID) | ORAL | 0 refills | Status: DC | PRN

## 2017-12-02 NOTE — ED Provider Notes (Signed)
[facility]    CSN: [id number] Arrival date & time: 12/02/17  [id number]     History   Chief Complaint Chief Complaint  Patient presents with  . Headache    HPI Julie Hendrix is a 32 y.o. female.   HPI  Patient states she is here for headache.  It hurts in the back of her head.  It hurts when she turns her neck.  It hurts into her neck muscles.  This is been going on for 5 to 7 days.  She did not have an injury, she did not have a fall.  She states she works at [employer] does look down at a table for long periods of time.  She is had no injury or overuse.  No prior history of neck pain.  No prior history of headaches, migraines.  She has some sneezing and allergies.  She does not have any sinus congestion or pain. Patient's been a smoker for submit many years.  She quit smoking about a week and a half ago.  She is quite proud of herself.  I congratulated her on this effort. She states she is otherwise in good health but she needs a physical examination, she worries about diabetes and heart disease because of her obesity.  She states her father had diabetes, hypertension, died of heart disease at a young age.  I recommend that she go to [facility] for more of a checkup.  She is not having any chest pain, shortness of breath, or cardiac symptoms at this time.  Past Medical History:  Diagnosis Date  . Abnormal Pap smear   . 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 Dysplasia of cervix, low grade (CIN 1)   . HPV (human papilloma virus) anogenital infection     Patient Active Problem List   Diagnosis Date Noted  . Morbid obesity with BMI of 40.0-44.9, adult (HCC) 11/30/2017  . PCOS (polycystic ovarian syndrome) 11/30/2017  . Low grade squamous intraepithelial lesion on cytologic smear of cervix (LGSIL) 05/21/2017  . Breast lump on right side at 10 o'clock position 01/31/2013  . Abnormal uterine bleeding 09/08/2012     Past Surgical History:  Procedure Laterality Date  . COLPOSCOPY    . CRYOTHERAPY  06/08/2017   cervix    OB History    Gravida  3   Para  2   Term  2   Preterm  0   AB  1   Living  2     SAB  0   TAB  1   Ectopic  0   Multiple  0   Live Births  2            Home Medications    Prior to Admission medications   Medication Sig Start Date End Date Taking? Authorizing Provider  cyclobenzaprine (FLEXERIL) 5 MG tablet Take 1 tablet (5 mg total) by mouth 3 (three) times daily as needed for muscle spasms. 12/02/17   Eustace MooreNelson, Yvonne Sue, MD  ibuprofen (ADVIL,MOTRIN) 800 MG tablet Take 1 tablet (800 mg total) by mouth 3 (three) times daily. 12/02/17   Eustace MooreNelson, Yvonne Sue, MD    Family History Family History  Problem Relation Age of Onset  . Hypertension Father   . Diabetes Father   . Heart attack Father   . Hypertension Mother     Social History Social History   Tobacco Use  . Smoking status: Former Smoker  Packs/day: 0.25    Types: Cigarettes    Last attempt to quit: 11/22/2017    Years since quitting: 0.0  . Smokeless tobacco: Never Used  . Tobacco comment: quit Aug 2019  Substance Use Topics  . Alcohol use: Yes    Comment: occasionally  . Drug use: No     Allergies   Latex   Review of Systems Review of Systems  Constitutional: Negative for chills and fever.  HENT: Negative for congestion, dental problem, ear pain and sore throat.   Eyes: Negative for photophobia, pain and visual disturbance.  Respiratory: Negative for cough and shortness of breath.   Cardiovascular: Negative for chest pain and palpitations.  Gastrointestinal: Negative for abdominal pain and vomiting.  Genitourinary: Negative for dysuria and hematuria.  Musculoskeletal: Positive for neck pain and neck stiffness. Negative for arthralgias and back pain.  Skin: Negative for color change and rash.  Neurological: Positive for headaches. Negative for seizures and syncope.   All other systems reviewed and are negative.    Physical Exam Triage Vital Signs ED Triage Vitals [12/02/17 1013]  Enc Vitals Group     BP (!) 146/75     Pulse Rate 82     Resp 18     Temp 98.1 F (36.7 C)     Temp Source Oral     SpO2 98 %   No data found.  Updated Vital Signs BP (!) 146/75 (BP Location: Left Arm)   Pulse 82   Temp 98.1 F (36.7 C) (Oral)   Resp 18   SpO2 98%      Physical Exam  Constitutional: She appears well-developed and well-nourished. No distress.  HENT:  Head: Normocephalic and atraumatic.  Mouth/Throat: Oropharynx is clear and moist.  Eyes: Pupils are equal, round, and reactive to light. Conjunctivae and EOM are normal.  Neck: Normal range of motion.  Tenderness on the right paraspinous cervical muscles of the occiput.  Mild tenderness in the upper body of the trapezius bilaterally.  Full range of motion.  Cardiovascular: Normal rate.  Pulmonary/Chest: Effort normal. No respiratory distress.  Abdominal: Soft. She exhibits no distension.  Musculoskeletal: Normal range of motion. She exhibits no edema.  Lymphadenopathy:    She has no cervical adenopathy.  Neurological: She is alert. She has normal strength. She displays normal reflexes.  Skin: Skin is warm and dry.  Psychiatric: She has a normal mood and affect.     UC Treatments / Results  Labs (all labs ordered are listed, but only abnormal results are displayed) Labs Reviewed - No data to display  EKG None  Radiology No results found.  Procedures Procedures (including critical care time)  Medications Ordered in UC Medications - No data to display  Initial Impression / Assessment and Plan / UC Course  I have reviewed the triage vital signs and the nursing notes.  Pertinent labs & imaging results that were available during my care of the patient were reviewed by me and considered in my medical decision making (see chart for details).    Discussed tension  headaches.  Final Clinical Impressions(s) / UC Diagnoses   Final diagnoses:  Tension headache     Discharge Instructions     Take the ibuprofen 3 x a day with food' Take the muscle relaxer as needed The cyclobenzaprine may cause drowsiness, take at bedtime    ED Prescriptions    Medication Sig Dispense Auth. Provider   ibuprofen (ADVIL,MOTRIN) 800 MG tablet Take 1 tablet (800 mg  total) by mouth 3 (three) times daily. 21 tablet Eustace Moore, MD   cyclobenzaprine (FLEXERIL) 5 MG tablet Take 1 tablet (5 mg total) by mouth 3 (three) times daily as needed for muscle spasms. 30 tablet Eustace Moore, MD     Controlled Substance Prescriptions [place] Controlled Substance Registry consulted? Not Applicable   Eustace Moore, MD 12/02/17 1056

## 2017-12-02 NOTE — Discharge Instructions (Signed)
Take the ibuprofen 3 x a day with food' Take the muscle relaxer as needed The cyclobenzaprine may cause drowsiness, take at bedtime

## 2017-12-02 NOTE — ED Triage Notes (Signed)
Pt sts HA and body aches x several days; pt sts recently stopped smoking

## 2017-12-08 ENCOUNTER — Encounter: Payer: Self-pay | Admitting: Family Medicine

## 2017-12-08 ENCOUNTER — Ambulatory Visit: Payer: Medicaid Other | Admitting: Obstetrics and Gynecology

## 2017-12-10 ENCOUNTER — Ambulatory Visit (HOSPITAL_COMMUNITY)
Admission: RE | Admit: 2017-12-10 | Discharge: 2017-12-10 | Disposition: A | Discharge status: Home / Self Care | Payer: Medicaid Other | Source: Ambulatory Visit | Attending: Nurse Practitioner | Admitting: Nurse Practitioner

## 2017-12-10 ENCOUNTER — Ambulatory Visit (HOSPITAL_COMMUNITY): Admission: RE | Admit: 2017-12-10 | Payer: Medicaid Other | Source: Ambulatory Visit

## 2017-12-10 DIAGNOSIS — N939 Abnormal uterine and vaginal bleeding, unspecified: Secondary | ICD-10-CM | POA: Insufficient documentation

## 2017-12-21 ENCOUNTER — Ambulatory Visit: Payer: Medicaid Other | Admitting: Nurse Practitioner

## 2017-12-29 ENCOUNTER — Ambulatory Visit: Payer: Medicaid Other | Admitting: Obstetrics & Gynecology

## 2018-01-19 ENCOUNTER — Encounter: Payer: Self-pay | Admitting: Nurse Practitioner

## 2018-01-19 ENCOUNTER — Ambulatory Visit (INDEPENDENT_AMBULATORY_CARE_PROVIDER_SITE_OTHER): Payer: Medicaid Other | Admitting: Nurse Practitioner

## 2018-01-19 ENCOUNTER — Other Ambulatory Visit (HOSPITAL_COMMUNITY)
Admission: RE | Admit: 2018-01-19 | Discharge: 2018-01-19 | Disposition: A | Discharge status: Home / Self Care | Payer: Medicaid Other | Source: Ambulatory Visit | Attending: Student | Admitting: Student

## 2018-01-19 VITALS — BP 134/87 | HR 74 | Ht 63.0 in | Wt 265.2 lb

## 2018-01-19 DIAGNOSIS — N871 Moderate cervical dysplasia: Secondary | ICD-10-CM

## 2018-01-19 DIAGNOSIS — N898 Other specified noninflammatory disorders of vagina: Secondary | ICD-10-CM

## 2018-01-19 DIAGNOSIS — E282 Polycystic ovarian syndrome: Secondary | ICD-10-CM

## 2018-01-19 DIAGNOSIS — Z01419 Encounter for gynecological examination (general) (routine) without abnormal findings: Secondary | ICD-10-CM

## 2018-01-19 DIAGNOSIS — Z0000 Encounter for general adult medical examination without abnormal findings: Secondary | ICD-10-CM

## 2018-01-19 DIAGNOSIS — Z6841 Body Mass Index (BMI) 40.0 and over, adult: Secondary | ICD-10-CM

## 2018-01-19 DIAGNOSIS — N6311 Unspecified lump in the right breast, upper outer quadrant: Secondary | ICD-10-CM

## 2018-01-19 DIAGNOSIS — N912 Amenorrhea, unspecified: Secondary | ICD-10-CM

## 2018-01-19 DIAGNOSIS — N6320 Unspecified lump in the left breast, unspecified quadrant: Secondary | ICD-10-CM

## 2018-01-19 DIAGNOSIS — Z30011 Encounter for initial prescription of contraceptive pills: Secondary | ICD-10-CM

## 2018-01-19 MED ORDER — FLUCONAZOLE 150 MG PO TABS: 150.0000 mg | ORAL_TABLET | Freq: Once | ORAL | 0 refills | Status: AC

## 2018-01-19 MED ORDER — MEDROXYPROGESTERONE ACETATE 10 MG PO TABS: 10.0000 mg | ORAL_TABLET | Freq: Every day | ORAL | 2 refills | Status: DC

## 2018-01-19 MED ORDER — NORETHIN ACE-ETH ESTRAD-FE 1.5-30 MG-MCG PO TABS: 1.0000 | ORAL_TABLET | Freq: Every day | ORAL | 3 refills | Status: DC

## 2018-01-19 NOTE — Progress Notes (Signed)
Pt states that her Itching, burning and pain started 2 days ago.

## 2018-01-19 NOTE — Addendum Note (Signed)
Addended by: Currie Paris on: 01/19/2018 11:17 PM   Modules accepted: Orders

## 2018-01-19 NOTE — Patient Instructions (Signed)
Oral Contraception Use Oral contraceptive pills (OCPs) are medicines taken to prevent pregnancy. OCPs work by preventing the ovaries from releasing eggs. The hormones in OCPs also cause the cervical mucus to thicken, preventing the sperm from entering the uterus. The hormones also cause the uterine lining to become thin, not allowing a fertilized egg to attach to the inside of the uterus. OCPs are highly effective when taken exactly as prescribed. However, OCPs do not prevent sexually transmitted diseases (STDs). Safe sex practices, such as using condoms along with an OCP, can help prevent STDs. Before taking OCPs, you may have a physical exam and Pap test. Your health care provider may also order blood tests if necessary. Your health care provider will make sure you are a good candidate for oral contraception. Discuss with your health care provider the possible side effects of the OCP you may be prescribed. When starting an OCP, it can take 2 to 3 months for the body to adjust to the changes in hormone levels in your body. How to take oral contraceptive pills Your health care provider may advise you on how to start taking the first cycle of OCPs. Otherwise, you can:  Start on day 1 of your menstrual period. You will not need any backup contraceptive protection with this start time.  Start on the first Sunday after your menstrual period or the day you get your prescription. In these cases, you will need to use backup contraceptive protection for the first week.  Start the pill at any time of your cycle. If you take the pill within 5 days of the start of your period, you are protected against pregnancy right away. In this case, you will not need a backup form of birth control. If you start at any other time of your menstrual cycle, you will need to use another form of birth control for 7 days. If your OCP is the type called a minipill, it will protect you from pregnancy after taking it for 2 days (48  hours).  After you have started taking OCPs:  If you forget to take 1 pill, take it as soon as you remember. Take the next pill at the regular time.  If you miss 2 or more pills, call your health care provider because different pills have different instructions for missed doses. Use backup birth control until your next menstrual period starts.  If you use a 28-day pack that contains inactive pills and you miss 1 of the last 7 pills (pills with no hormones), it will not matter. Throw away the rest of the non-hormone pills and start a new pill pack.  No matter which day you start the OCP, you will always start a new pack on that same day of the week. Have an extra pack of OCPs and a backup contraceptive method available in case you miss some pills or lose your OCP pack. Follow these instructions at home:  Do not smoke.  Always use a condom to protect against STDs. OCPs do not protect against STDs.  Use a calendar to mark your menstrual period days.  Read the information and directions that came with your OCP. Talk to your health care provider if you have questions. Contact a health care provider if:  You develop nausea and vomiting.  You have abnormal vaginal discharge or bleeding.  You develop a rash.  You miss your menstrual period.  You are losing your hair.  You need treatment for mood swings or depression.  You   get dizzy when taking the OCP.  You develop acne from taking the OCP.  You become pregnant. Get help right away if:  You develop chest pain.  You develop shortness of breath.  You have an uncontrolled or severe headache.  You develop numbness or slurred speech.  You develop visual problems.  You develop pain, redness, and swelling in the legs. This information is not intended to replace advice given to you by your health care provider. Make sure you discuss any questions you have with your health care provider. Document Released: 03/26/2011 Document  Revised: 09/12/2015 Document Reviewed: 09/25/2012 Elsevier Interactive Patient Education  2017 Elsevier Inc.  

## 2018-01-19 NOTE — Progress Notes (Signed)
GYNECOLOGY ANNUAL PREVENTATIVE CARE ENCOUNTER NOTE  Subjective:   Julie Hendrix is a 32 y.o. [phone] female here for a routine annual gynecologic exam.  Current complaints: vaginal itching, breast lump and no menses in 5 months.   Denies abnormal vaginal bleeding, discharge, pelvic pain, problems with intercourse or other gynecologic concerns.   Has had a history of months with no period and then bleeding for 6 months.  Was considering a hormonal IUD but never did return for the IUD.   Gynecologic History No LMP recorded. (Menstrual status: Irregular Periods). Contraception: condoms Last Pap: Had cryo 2-19, will need pap with HPV on 05-2018 and 05-2019 Last mammogram: - Had breast mass in right breast that was evaluated previously - benign  Obstetric History OB History  Gravida Para Term Preterm AB Living  3 2 2  0 1 2  SAB TAB Ectopic Multiple Live Births  0 1 0 0 2    # Outcome Date GA Lbr Len/2nd Weight Sex Delivery Anes PTL Lv  3 Term 06/16/03 [redacted]w[redacted]d   M Vag-Spont   LIV  2 Term 10/05/98 [redacted]w[redacted]d   F    LIV  1 TAB             Past Medical History:  Diagnosis Date  . Abnormal Pap smear   . 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 Dysplasia of cervix, low grade (CIN 1)   . HPV (human papilloma virus) anogenital infection     Past Surgical History:  Procedure Laterality Date  . COLPOSCOPY    . CRYOTHERAPY  06/08/2017   cervix    Current Outpatient Medications on File Prior to Visit  Medication Sig Dispense Refill  . ibuprofen (ADVIL,MOTRIN) 800 MG tablet Take 1 tablet (800 mg total) by mouth 3 (three) times daily. 21 tablet 0  . cyclobenzaprine (FLEXERIL) 5 MG tablet Take 1 tablet (5 mg total) by mouth 3 (three) times daily as needed for muscle spasms. (Patient not taking: Reported on 01/19/2018) 30 tablet 0   No current facility-administered medications on file prior to visit.     Allergies  Allergen Reactions  . Latex Rash     Social History   Socioeconomic History  . Marital status: Single    Spouse name: Not on file  . Number of children: Not on file  . Years of education: Not on file  . Highest education level: Not on file  Occupational History  . Not on file  Social Needs  . Financial resource strain: Not on file  . Food insecurity:    Worry: Not on file    Inability: Not on file  . Transportation needs:    Medical: Not on file    Non-medical: Not on file  Tobacco Use  . Smoking status: Former Smoker    Packs/day: 0.25    Types: Cigarettes    Last attempt to quit: 11/22/2017    Years since quitting: 0.1  . Smokeless tobacco: Never Used  . Tobacco comment: quit Aug 2019  Substance and Sexual Activity  . Alcohol use: Yes    Comment: occasionally  . Drug use: No  . Sexual activity: Yes    Birth control/protection: None  Lifestyle  . Physical activity:    Days per week: 0 days    Minutes per session: 0 min  . Stress: Not at all  Relationships  . Social connections:    Talks on phone: More than three times a  week    Gets together: Once a week    Attends religious service: More than 4 times per year    Active member of club or organization: No    Attends meetings of clubs or organizations: Never    Relationship status: Never married  . Intimate partner violence:    Fear of current or ex partner: No    Emotionally abused: No    Physically abused: No    Forced sexual activity: No  Other Topics Concern  . Not on file  Social History Narrative  . Not on file    Family History  Problem Relation Age of Onset  . Hypertension Father   . Diabetes Father   . Heart attack Father   . Hypertension Mother     The following portions of the patient's history were reviewed and updated as appropriate: allergies, current medications, past family history, past medical history, past social history, past surgical history and problem list.  Review of Systems Pertinent items noted in HPI and  remainder of comprehensive ROS otherwise negative.   Objective:  BP 134/87   Pulse 74   Ht 5\' 3"  (1.6 m)   Wt 265 lb 3.2 oz (120.3 kg)   BMI 46.98 kg/m  CONSTITUTIONAL: Well-developed, obese female in no acute distress.  HENT:  Normocephalic, atraumatic, External right and left ear normal.  EYES: Conjunctivae and EOM are normal. Pupils are equal, round.  No scleral icterus.  NECK: Normal range of motion, supple, no masses.  Normal thyroid.  SKIN: Skin is warm and dry. No rash noted. Not diaphoretic. No erythema. No pallor. NEUROLOGIC: Alert and oriented to person, place, and time. Normal reflexes, muscle tone coordination. No cranial nerve deficit noted. PSYCHIATRIC: Normal mood and affect. Normal behavior. Normal judgment and thought content. CARDIOVASCULAR: Normal heart rate noted, regular rhythm RESPIRATORY: Clear to auscultation bilaterally. Effort and breath sounds normal, no problems with respiration noted. BREASTS: Symmetric in size. No skin changes, nipple drainage, or lymphadenopathy.  Mass palpated at 11 o'clock on right breast 0.5 cm x 0.75 cm at most - very firm, smooth, mobile ABDOMEN: Soft, no distention noted.  No tenderness, rebound or guarding.  PELVIC: Normal appearing external genitalia; white coating on inner vaginal mucosa and cervix.  Normal uterine size, no other palpable masses, no uterine or adnexal tenderness. MUSCULOSKELETAL: Normal range of motion. No tenderness.  No cyanosis, clubbing, or edema.    Assessment and Plan:  1. 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 Consutl with Dr. Vergie Living - will change to Provera 100 mg PO daily x 14 days every month to have a withdrawal bleed,  (initially prescribed irth control pills but advised client not to fill these and not to take these due to breast mass).  2. Discharge of vagina Likely has a yeast infection - prescribe Diflucan and will get HA1C to rule out diabetes  - Cervicovaginal ancillary only  3. CIN II  (cervical intraepithelial neoplasia II) Had LEEP in 05-2017.  Needs pap with HPV on 05-2018 and 05-2019  4. Left breast mass Do not take birth control pills initially prescribed today.  Will treat secondary amenorrhea with provera x 2 weeks monthly Referred to [facility] for evaluation of left breast  5. Women's annual routine gynecological examination   6. Morbid obesity with BMI of 40.0-44.9, adult (HCC) Advised increased exercise and weight loss.  Client has recently stopped drinking soda and has stopped smoking for past 2 months and has noticed she has gained some weight  from that.  Reports she will start walking. - Hemoglobin A1c  7. PCOS (polycystic ovarian syndrome) Provera 10 mg PO daily x 14 days Discussed Liletta/Mirena IUD to use rather than provera for a contraceptive benefit as well as avoiding prolonged vaginal bleeding  - would be a good choice for this client and she is going to consider it once again.  8. Yeast infection - presumptive Will treat with Diflucan  Routine preventative health maintenance measures emphasized. Please refer to After Visit Summary for other counseling recommendations.    Nolene Bernheim, RN, MSN, NP-BC Nurse Practitioner, [facility] for [facility]

## 2018-01-20 LAB — HEMOGLOBIN A1C
Est. average glucose Bld gHb Est-mCnc: 126 mg/dL
HEMOGLOBIN A1C: 6 % — AB (ref 4.8–5.6)

## 2018-01-20 LAB — POCT PREGNANCY, URINE: Preg Test, Ur: NEGATIVE

## 2018-01-21 LAB — CERVICOVAGINAL ANCILLARY ONLY
Bacterial vaginitis: NEGATIVE
Candida vaginitis: POSITIVE — AB
Chlamydia: NEGATIVE
Neisseria Gonorrhea: NEGATIVE
Trichomonas: NEGATIVE

## 2018-01-31 ENCOUNTER — Telehealth: Payer: Self-pay | Admitting: *Deleted

## 2018-01-31 NOTE — Telephone Encounter (Signed)
Pt left message stating that she has not heard about the appointment to see the breast doctor. Also, she took the medication for yeast and is still having some sx. I called pt back and advised that she was sent a MyChart message regarding the breast mammogram by the nurse practitioner 2 days ago. I gave the appointment information (10/17 @ 1115) and recommended that pt check her MyChart account. Pt stated that she took the Diflucan on 10/3 and is still having a little itching as well as discharge with light odor. I informed her that the test for BV was negative and recommended pt try OTC hydrocortisone cream to external genitalia. If the sx become worse she should send a [employer]. Pt also stated that she was unclear whether or not to take the birth control pills after she takes the hormone pills that were prescribed. She is confused about her medication instructions. I advised that she should not take the birth control pills at all until her breast mass has been fully evaluated. She should only take the provera pills for 2 weeks, then wait a month and take them for another 2 weeks. Each time she should make notes regarding any bleeding she has after finishing the 2 weeks of pills and report that information @ her next office visit. Pt voiced understanding of all information and instructions given.

## 2018-02-02 ENCOUNTER — Other Ambulatory Visit (HOSPITAL_COMMUNITY): Payer: Self-pay | Admitting: *Deleted

## 2018-02-02 DIAGNOSIS — N6320 Unspecified lump in the left breast, unspecified quadrant: Secondary | ICD-10-CM

## 2018-02-08 ENCOUNTER — Ambulatory Visit: Payer: Medicaid Other | Admitting: Student

## 2018-02-15 ENCOUNTER — Encounter (HOSPITAL_COMMUNITY): Payer: Self-pay

## 2018-02-15 ENCOUNTER — Ambulatory Visit (HOSPITAL_COMMUNITY)
Admission: RE | Admit: 2018-02-15 | Discharge: 2018-02-15 | Disposition: A | Discharge status: Home / Self Care | Payer: Medicaid Other | Source: Ambulatory Visit | Attending: Obstetrics and Gynecology | Admitting: Obstetrics and Gynecology

## 2018-02-15 DIAGNOSIS — Z1239 Encounter for other screening for malignant neoplasm of breast: Secondary | ICD-10-CM

## 2018-02-15 DIAGNOSIS — N6322 Unspecified lump in the left breast, upper inner quadrant: Secondary | ICD-10-CM

## 2018-02-15 NOTE — Progress Notes (Signed)
Complaints of a left breast lump x 2 months.  Pap Smear: Pap smear not completed today. Last Pap smear was 01/26/2017 at the [facility] Department and LGSIL Patient had a colposcopy to follow-up for abnormal Pap smear on 05/21/2017 that showed CIN-II and cryotherapy completed 06/08/2017. Patient has a history of two other abnormal Pap smears on 05/29/2016 that was ASCUS with negative HPV that a colposcopy was completed on 06/22/2016 that was benign and 11/14/2015 that was ASCUS with positive HPV. Patient stated did not follow up per recommendations on for her abnormal Pap smear on 11/14/2015. It was recommended to have a colposcopy completed and due to being over six months since Pap smear was completed needed to have a repeat Pap smear. Patient stated she had one other abnormal Pap smear but was unsure of the date. Patient stated she had a colposcopy for follow-up. Last four Pap smear and last two colposcopy results are in Epic.  Physical exam:  Breasts Breasts symmetrical. No skin abnormalities bilateral breasts. No nipple retraction bilateral breasts. No nipple discharge bilateral breasts. No lymphadenopathy. No lumps palpated right breast. Palpated a pea sized mobile lump within the left breast at 11 o'clock 8 cm from the nipple. No complaints of pain or tenderness on exam. Referred patient to the [facility] for a left breast diagnostic mammogram and  ultrasound. Appointment scheduled for Wednesday, February 16, 2018 at 1110.        Pelvic/Bimanual No Pap smear completed today since last Pap smear was 01/26/2017 and follow-up  colposcopy 05/21/2017. Pap smear not indicated per BCCCP guidelines.   Smoking History: Patient is a former smoker that quit 11/28/2017.  Patient Navigation: Patient education provided. Access to services provided for patient through BCCCP program.    Breast and Cervical Cancer Risk Assessment: Patient has no family history of breast cancer, known genetic  mutations, or radiation treatment to the chest before age 16. Patient has a history of cervical dysplasia. Patient has no history of being immunocompromised or DES exposure in-utero. Breast Cancer risk assessment completed. Unable to calculate breast cancer risk due to patient is less than 40 years old.

## 2018-02-15 NOTE — Patient Instructions (Addendum)
Explained breast self awareness with [employer]. Patient did not need a Pap smear today due to last Pap smear was 01/26/2017 and follow-up colposcopy was 05/21/2017. Patient has [facility] and advised to schedule follow-up Pap smear at the [facility] Department or come to the free cervical cancer screening in February 2020. Referred patient to the [facility] for a left breast diagnostic mammogram and  ultrasound. Appointment scheduled for Wednesday, February 16, 2018 at 1110. Patient aware of appointment and will be there. Pricsilla Tamez verbalized understanding.  Dajha Urquilla, Kathaleen Maser, RN 10:46 AM

## 2018-02-16 ENCOUNTER — Other Ambulatory Visit (HOSPITAL_COMMUNITY): Payer: Self-pay | Admitting: Obstetrics and Gynecology

## 2018-02-16 ENCOUNTER — Ambulatory Visit
Admission: RE | Admit: 2018-02-16 | Discharge: 2018-02-16 | Disposition: A | Discharge status: Home / Self Care | Payer: No Typology Code available for payment source | Source: Ambulatory Visit | Attending: Obstetrics and Gynecology | Admitting: Obstetrics and Gynecology

## 2018-02-16 ENCOUNTER — Other Ambulatory Visit: Payer: Self-pay | Admitting: Obstetrics and Gynecology

## 2018-02-16 DIAGNOSIS — N6320 Unspecified lump in the left breast, unspecified quadrant: Secondary | ICD-10-CM

## 2018-02-16 DIAGNOSIS — N641 Fat necrosis of breast: Secondary | ICD-10-CM

## 2018-03-03 ENCOUNTER — Ambulatory Visit (INDEPENDENT_AMBULATORY_CARE_PROVIDER_SITE_OTHER): Payer: Self-pay | Admitting: Family Medicine

## 2018-03-03 ENCOUNTER — Other Ambulatory Visit (HOSPITAL_COMMUNITY)
Admission: RE | Admit: 2018-03-03 | Discharge: 2018-03-03 | Disposition: A | Discharge status: Home / Self Care | Payer: Medicaid Other | Source: Ambulatory Visit | Attending: Family Medicine | Admitting: Family Medicine

## 2018-03-03 VITALS — BP 147/90 | HR 82 | Ht 63.0 in | Wt 267.8 lb

## 2018-03-03 DIAGNOSIS — N898 Other specified noninflammatory disorders of vagina: Secondary | ICD-10-CM | POA: Insufficient documentation

## 2018-03-03 NOTE — Progress Notes (Signed)
   Subjective:    Julie Hendrix - 32 y.o. female MRN [id number]  Date of birth: June 10, 1985  HPI  Julie Kickslexis Merell Slade Shugrue is a 32 y.o. [id number] female here for vaginal discharge. States that she has had a yellowish malodorous discharge for the past few days. Afraid it might be something sexually transmitted because she had unprotected sex 6 days ago. Prior to the appearance of this discharge, she had bacterial vaginosis and a yeast infection, both of which were treated with improvement. She also reports that she used a douche in the last few days to help improve the discharge. She denies vaginal itching, burning or pain.      OB History    Gravida  3   Para  2   Term  2   Preterm  0   AB  1   Living  2     SAB  0   TAB  1   Ectopic  0   Multiple  0   Live Births  2             Health Maintenance:  Last pap with CIN1/2. Underwent cryotherapy 06/08/2017.  Health Maintenance:  There are no preventive care reminders to display for this patient.  -  reports that she quit smoking about 3 months ago. Her smoking use included cigarettes. She smoked 0.25 packs per day. She has never used smokeless tobacco. - Review of Systems: Per HPI. - Past Medical History: Patient Active Problem List   Diagnosis Date Noted  . Morbid obesity with BMI of 40.0-44.9, adult (HCC) 11/30/2017  . PCOS (polycystic ovarian syndrome) 11/30/2017  . Low grade squamous intraepithelial lesion on cytologic smear of cervix (LGSIL) 05/21/2017  . 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 Breast lump on right side at 10 o'clock position 01/31/2013  . Abnormal uterine bleeding 09/08/2012   - Medications: reviewed and updated   Objective:   Physical Exam BP (!) 147/90 (BP Location: Left Arm)   Pulse 82   Ht 5\' 3"  (1.6 m)   Wt 267 lb 12.8 oz (121.5 kg)   LMP 02/23/2018 (Exact Date)   BMI 47.44 kg/m  Gen: NAD, alert, cooperative with exam, well-appearing HEENT:  NCAT, PER, clear conjunctiva, oropharynx clear, supple neck Resp: non-labored Skin: no rashes, normal turgor  Neuro: no gross deficits.  Psych: good insight, alert and oriented GU/GYN: Exam performed in the presence of a chaperone. External genitalia within normal limits.  Vaginal mucosa pink, moist, normal rugae.  Nonfriable cervix without lesions, small amount of milky white discharge, no bleeding noted on speculum exam.  Bimanual exam revealed normal, nongravid uterus.  No cervical motion tenderness. No adnexal masses bilaterally.        Assessment & Plan:   1. Discharge from the vagina - Cervicovaginal ancillary only - will send treatment if positive results   Routine preventative health maintenance measures emphasized. Please refer to After Visit Summary for other counseling recommendations.   No follow-ups on file.  Gwenevere AbbotNimeka Caidence Kaseman, MD OB Fellow  03/03/2018, 5:56 PM

## 2018-03-03 NOTE — Progress Notes (Signed)
Pt states is having abdominal Pain& d/c with odor

## 2018-03-07 ENCOUNTER — Other Ambulatory Visit: Payer: Self-pay | Admitting: Family Medicine

## 2018-03-07 DIAGNOSIS — B9689 Other specified bacterial agents as the cause of diseases classified elsewhere: Secondary | ICD-10-CM

## 2018-03-07 DIAGNOSIS — N760 Acute vaginitis: Principal | ICD-10-CM

## 2018-03-07 LAB — CERVICOVAGINAL ANCILLARY ONLY
Bacterial vaginitis: POSITIVE — AB
CANDIDA VAGINITIS: NEGATIVE
CHLAMYDIA, DNA PROBE: NEGATIVE
Neisseria Gonorrhea: NEGATIVE
TRICH (WINDOWPATH): NEGATIVE

## 2018-03-07 MED ORDER — METRONIDAZOLE 0.75 % VA GEL: 1.0000 | Freq: Every day | VAGINAL | 0 refills | Status: AC

## 2018-03-07 NOTE — Progress Notes (Signed)
Called patient to notify of results. Patient requests metrogel instead of oral capsules for treatment of BV. Sent to preferred pharmacy. All questions answered and patient expressed understanding.   Gwenevere AbbotNimeka Filimon Miranda, MD  4:26 PM 03/07/18

## 2018-03-27 ENCOUNTER — Emergency Department (HOSPITAL_COMMUNITY)
Admission: EM | Admit: 2018-03-27 | Discharge: 2018-03-27 | Disposition: A | Discharge status: Home / Self Care | Payer: Medicaid Other | Attending: Emergency Medicine | Admitting: Emergency Medicine

## 2018-03-27 ENCOUNTER — Encounter (HOSPITAL_COMMUNITY): Payer: Self-pay | Admitting: Emergency Medicine

## 2018-03-27 DIAGNOSIS — R1013 Epigastric pain: Secondary | ICD-10-CM

## 2018-03-27 DIAGNOSIS — E282 Polycystic ovarian syndrome: Secondary | ICD-10-CM | POA: Insufficient documentation

## 2018-03-27 DIAGNOSIS — R197 Diarrhea, unspecified: Secondary | ICD-10-CM | POA: Diagnosis not present

## 2018-03-27 DIAGNOSIS — N760 Acute vaginitis: Secondary | ICD-10-CM | POA: Diagnosis not present

## 2018-03-27 DIAGNOSIS — A09 Infectious gastroenteritis and colitis, unspecified: Secondary | ICD-10-CM | POA: Diagnosis not present

## 2018-03-27 DIAGNOSIS — Z87891 Personal history of nicotine dependence: Secondary | ICD-10-CM | POA: Insufficient documentation

## 2018-03-27 DIAGNOSIS — B9689 Other specified bacterial agents as the cause of diseases classified elsewhere: Secondary | ICD-10-CM | POA: Diagnosis not present

## 2018-03-27 DIAGNOSIS — Z79899 Other long term (current) drug therapy: Secondary | ICD-10-CM | POA: Insufficient documentation

## 2018-03-27 LAB — COMPREHENSIVE METABOLIC PANEL
ALT: 20 U/L (ref 0–44)
AST: 24 U/L (ref 15–41)
Albumin: 4 g/dL (ref 3.5–5.0)
Alkaline Phosphatase: 49 U/L (ref 38–126)
Anion gap: 10 (ref 5–15)
BILIRUBIN TOTAL: 0.7 mg/dL (ref 0.3–1.2)
BUN: 10 mg/dL (ref 6–20)
CHLORIDE: 106 mmol/L (ref 98–111)
CO2: 22 mmol/L (ref 22–32)
CREATININE: 0.66 mg/dL (ref 0.44–1.00)
Calcium: 9 mg/dL (ref 8.9–10.3)
Glucose, Bld: 126 mg/dL — ABNORMAL HIGH (ref 70–99)
POTASSIUM: 3.6 mmol/L (ref 3.5–5.1)
Sodium: 138 mmol/L (ref 135–145)
TOTAL PROTEIN: 7.6 g/dL (ref 6.5–8.1)

## 2018-03-27 LAB — URINALYSIS, ROUTINE W REFLEX MICROSCOPIC
BILIRUBIN URINE: NEGATIVE
Glucose, UA: NEGATIVE mg/dL
Hgb urine dipstick: NEGATIVE
Ketones, ur: NEGATIVE mg/dL
Leukocytes, UA: NEGATIVE
NITRITE: NEGATIVE
Protein, ur: NEGATIVE mg/dL
SPECIFIC GRAVITY, URINE: 1.023 (ref 1.005–1.030)
pH: 5 (ref 5.0–8.0)

## 2018-03-27 LAB — CBC
HCT: 41.8 % (ref 36.0–46.0)
HEMOGLOBIN: 13 g/dL (ref 12.0–15.0)
MCH: 26.3 pg (ref 26.0–34.0)
MCHC: 31.1 g/dL (ref 30.0–36.0)
MCV: 84.4 fL (ref 80.0–100.0)
NRBC: 0 % (ref 0.0–0.2)
Platelets: 250 10*3/uL (ref 150–400)
RBC: 4.95 MIL/uL (ref 3.87–5.11)
RDW: 15.3 % (ref 11.5–15.5)
WBC: 10.1 10*3/uL (ref 4.0–10.5)

## 2018-03-27 LAB — WET PREP, GENITAL
Sperm: NONE SEEN
Trich, Wet Prep: NONE SEEN
Yeast Wet Prep HPF POC: NONE SEEN

## 2018-03-27 LAB — I-STAT BETA HCG BLOOD, ED (MC, WL, AP ONLY)

## 2018-03-27 LAB — LIPASE, BLOOD: LIPASE: 24 U/L (ref 11–51)

## 2018-03-27 MED ORDER — METRONIDAZOLE 500 MG PO TABS: 500.0000 mg | ORAL_TABLET | Freq: Two times a day (BID) | ORAL | 0 refills | Status: DC

## 2018-03-27 NOTE — ED Provider Notes (Signed)
[facility]-EMERGENCY DEPT Provider Note   CSN: [id number] Arrival date & time: 03/27/18  1642     History   Chief Complaint Chief Complaint  Patient presents with  . Abdominal Pain  . Diarrhea  . Urinary Frequency  . Vaginal Discharge    HPI [facility] Julie Hendrix is a 32 y.o. female with history of PCOS, cervical neoplasia status post LEEP, obesity, acid reflux is here for evaluation of abdominal pain.  Onset 4 days ago.  Located to the epigastrium.  The pain is described as "a boiling" and "growling" sensation that intermittently radiates into her lower abdomen. Pain is mild.  The pain begins right before she has the urge to have diarrhea.  The pain significantly improves after diarrhea.  She has had up to 5-6 bowel movements daily, dark brown but no melena or bright red blood noted.  Associate symptoms include increased belching, gas.  The pain is worse after eating and better when she is not eating.  No sick contacts.  States that the day after Thanksgiving she had leftovers and noticed that there was a funky smell to them.  She reports discharge from the vagina, she went to OB/GYN on 11/14 and was diagnosed with bacterial vaginosis.  She was prescribed Flagyl intravaginal cream but she only used this for 3 days because she started having vaginal itching, thick, cottage cheese type discharge.  States that this has slightly improved and now is more of a watery/milky discharge and she has mild vaginal itching.  Also reports darker urine, urinary frequency, larger volume urine and urgency.  No sick contacts.  No recent oral antibiotics.  No interventions.  Has noticed her acid reflux has been worsening over the last several weeks since Thanksgiving.  She has had more belching, gas.  She takes Tums as needed without benefit.  Admits to eating a lot of fried food and abdominal pain worsening "tightening" after meals. Occasional EtOH use.  No heavy use of NSAIDs.  No  history of ulcers.  No abdominal surgeries.  She denies fevers, chills, chest pain, shortness of breath, cough, nausea, vomiting, dysuria, hematuria.  She is requesting STD testing because she had unprotected sexual encounter after she was tested for STDs on 11/14.  HPI  Past Medical History:  Diagnosis Date  . Abnormal Pap smear   . 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 Dysplasia of cervix, low grade (CIN 1)   . HPV (human papilloma virus) anogenital infection     Patient Active Problem List   Diagnosis Date Noted  . Bacterial vaginosis 03/07/2018  . Morbid obesity with BMI of 40.0-44.9, adult (HCC) 11/30/2017  . PCOS (polycystic ovarian syndrome) 11/30/2017  . Low grade squamous intraepithelial lesion on cytologic smear of cervix (LGSIL) 05/21/2017  . 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 Breast lump on right side at 10 o'clock position 01/31/2013  . Abnormal uterine bleeding 09/08/2012    Past Surgical History:  Procedure Laterality Date  . COLPOSCOPY    . CRYOTHERAPY  06/08/2017   cervix     OB History    Gravida  3   Para  2   Term  2   Preterm  0   AB  1   Living  2     SAB  0   TAB  1   Ectopic  0   Multiple  0   Live Births  2  Home Medications    Prior to Admission medications   Medication Sig Start Date End Date Taking? Authorizing Provider  cyclobenzaprine (FLEXERIL) 5 MG tablet Take 1 tablet (5 mg total) by mouth 3 (three) times daily as needed for muscle spasms. Patient not taking: Reported on 01/19/2018 12/02/17   Eustace Moore, MD  ibuprofen (ADVIL,MOTRIN) 800 MG tablet Take 1 tablet (800 mg total) by mouth 3 (three) times daily. Patient not taking: Reported on 02/15/2018 12/02/17   Eustace Moore, MD  medroxyPROGESTERone (PROVERA) 10 MG tablet Take 1 tablet (10 mg total) by mouth daily. Take for 2 weeks and then repeat again in one month. Patient  not taking: Reported on 02/15/2018 01/19/18   Currie Paris, NP  metroNIDAZOLE (FLAGYL) 500 MG tablet Take 1 tablet (500 mg total) by mouth 2 (two) times daily. 03/27/18   Liberty Handy, PA-C    Family History Family History  Problem Relation Age of Onset  . Hypertension Father   . Diabetes Father   . Heart attack Father   . Hypertension Mother     Social History Social History   Tobacco Use  . Smoking status: Former Smoker    Packs/day: 0.25    Types: Cigarettes    Last attempt to quit: 11/22/2017    Years since quitting: 0.3  . Smokeless tobacco: Never Used  . Tobacco comment: quit Aug 2019  Substance Use Topics  . Alcohol use: Yes    Comment: occasionally  . Drug use: No     Allergies   Patient has no known allergies.   Review of Systems Review of Systems  Gastrointestinal: Positive for abdominal pain and diarrhea.       Increased gas, belching   Genitourinary: Positive for frequency, urgency and vaginal discharge.  All other systems reviewed and are negative.    Physical Exam Updated Vital Signs BP 137/86 (BP Location: Right Arm)   Pulse 71   Temp 98.2 F (36.8 C) (Oral)   Resp 18   LMP 03/06/2018   SpO2 100%   Physical Exam  Constitutional: She is oriented to person, place, and time. She appears well-developed and well-nourished.  Non toxic  HENT:  Head: Normocephalic and atraumatic.  Nose: Nose normal.  Eyes: Pupils are equal, round, and reactive to light. Conjunctivae and EOM are normal.  Neck: Normal range of motion.  Cardiovascular: Normal rate and regular rhythm.  Pulmonary/Chest: Effort normal and breath sounds normal.  Abdominal: Soft. Bowel sounds are normal. There is no tenderness.  No G/R/R. No suprapubic or CVA tenderness. Negative Murphy's and McBurney's.   Genitourinary: Vaginal discharge found.  Genitourinary Comments:  Exam performed with EMT at bedside for assistance. External genitalia without lesions.  No groin  lymphadenopathy.  Vaginal mucosa and cervix pink without lesions.  Moderate amount of milky runny white discharge in vaginal vault noted.  Positive whiff test.  No CMT.  Nonpalpable, nontender adnexa.  Perianal skin normal without lesions.  Musculoskeletal: Normal range of motion.  Neurological: She is alert and oriented to person, place, and time.  Skin: Skin is warm and dry. Capillary refill takes less than 2 seconds.  Psychiatric: She has a normal mood and affect. Her behavior is normal.  Nursing note and vitals reviewed.    ED Treatments / Results  Labs (all labs ordered are listed, but only abnormal results are displayed) Labs Reviewed  WET PREP, GENITAL - Abnormal; Notable for the following components:      Result Value  Clue Cells Wet Prep HPF POC PRESENT (*)    WBC, Wet Prep HPF POC MODERATE (*)    All other components within normal limits  COMPREHENSIVE METABOLIC PANEL - Abnormal; Notable for the following components:   Glucose, Bld 126 (*)    All other components within normal limits  URINE CULTURE  LIPASE, BLOOD  CBC  URINALYSIS, ROUTINE W REFLEX MICROSCOPIC  I-STAT BETA HCG BLOOD, ED ([facility], [facility], [facility] ONLY)  GC/CHLAMYDIA PROBE AMP ([place]) NOT AT [facility]    EKG None  Radiology No results found.  Procedures Procedures (including critical care time)  Medications Ordered in ED Medications - No data to display   Initial Impression / Assessment and Plan / ED Course  I have reviewed the triage vital signs and the nursing notes.  Pertinent labs & imaging results that were available during my care of the patient were reviewed by me and considered in my medical decision making (see chart for details).  Clinical Course as of Mar 27 2018  Sun Mar 27, 2018  2014 WBC, Wet Prep HPF POC(!): MODERATE [CG]  2014 Clue Cells Wet Prep HPF POC(!): PRESENT [CG]    Clinical Course User Index [CG] Liberty HandyGibbons, Joelyn Lover J, PA-C   32 year old here with diarrhea.  High suspicion for  viral gastroenteritis given recent exposure to suspicious food.  Could also be acid reflux versus gastritis leading to epigastric abdominal discomfort.  She is afebrile.  No peritonitis on exam.  No actual tenderness on exam today.  Negative Murphy's and McBurney's.  I doubt pancreatitis, cholecystitis, appendicitis.  She has no suprapubic or CVA tenderness, hematuria and renal etiology is less likely.  She has no history of diverticulitis and no lower abdominal tenderness.  We will obtain screening labs.  Pelvic exam for recent exposure to unprotected sexual encounter and to rule out yeast vaginitis.  She is not having any pain currently.   2015: Abdominal labs unremarkable.  Normal LFTs, lipase.  On repeat exam, patient has no epigastric or right upper quadrant tenderness.  I did consider cholelithiasis versus cholecystitis however given lack of fever, abdominal pain, normal LFTs this is unlikely.  I do not think there is need for emergent imaging today.  I have higher suspicion for gastritis versus GERD versus PUD.  I will discharge with omeprazole, famotidine, diet modifications, GI follow-up.  Wet prep shows bacterial vaginosis which fits the history as she did not completed Flagyl course recently.  We will discharge with Flagyl.  She declined empiric STD treatment here and wants to wait for results.  Return precautions given.  Patient is in agreement with this. Final Clinical Impressions(s) / ED Diagnoses   Final diagnoses:  Diarrhea of presumed infectious origin  Epigastric abdominal pain  Bacterial vaginosis    ED Discharge Orders         Ordered    metroNIDAZOLE (FLAGYL) 500 MG tablet  2 times daily     03/27/18 2016           Jerrell MylarGibbons, Thersa Mohiuddin J, PA-C 03/27/18 2019    Benjiman CorePickering, Nathan, MD 03/28/18 [phone]

## 2018-03-27 NOTE — ED Triage Notes (Signed)
Pt c/o mid abd pains with diarrhea and urinary frequency x 4 days. Pt reports that she itching in vaginal area with clear, white discharge and odor for week.

## 2018-03-27 NOTE — Discharge Instructions (Addendum)
You were seen in the ER for diarrhea.  I suspect this is from a virus.  Stay well-hydrated.  Your lab work was normal today.  In regards to your upper middle abdominal pain, I think this is from either acid reflux or gastritis.  Start taking omeprazole 40 mg every morning on empty stomach, wait 20 to 30 minutes to eat or drink anything.  Additionally take famotidine 20 mg every morning and night.  You need to modify your diet, avoid greasy, fatty, fried, acidic foods.  Avoid alcohol, ibuprofen or aspirin containing products.  Follow-up with gastroenterology specialist if your symptoms are not improving.  Return to the ER for worsening, constant abdominal pain, fevers, blood in your vomit or stools, urinary symptoms, chest pain or shortness of breath.  Swabs today showed bacterial vaginosis.  Take Flagyl for this.  Do not consume alcohol while taking this medicine.

## 2018-03-28 LAB — GC/CHLAMYDIA PROBE AMP (~~LOC~~) NOT AT ARMC
Chlamydia: NEGATIVE
Neisseria Gonorrhea: NEGATIVE

## 2018-03-29 LAB — URINE CULTURE: Culture: <10000 — AB

## 2018-04-20 HISTORY — PX: BREAST BIOPSY: SHX20

## 2018-05-04 ENCOUNTER — Other Ambulatory Visit (HOSPITAL_COMMUNITY)
Admission: RE | Admit: 2018-05-04 | Discharge: 2018-05-04 | Disposition: A | Discharge status: Home / Self Care | Payer: Medicaid Other | Source: Ambulatory Visit | Attending: Family Medicine | Admitting: Family Medicine

## 2018-05-04 ENCOUNTER — Ambulatory Visit (INDEPENDENT_AMBULATORY_CARE_PROVIDER_SITE_OTHER): Payer: Medicaid Other

## 2018-05-04 DIAGNOSIS — N898 Other specified noninflammatory disorders of vagina: Secondary | ICD-10-CM

## 2018-05-04 NOTE — Progress Notes (Addendum)
Pt here today with c/o vaginal discharge and odor.  Pt explained how to obtain self swab and that we will call her with abnormal results. Pt stated understanding with no further questions.   Reviewed note and agree with the plan of care.  Nolene BernheimERRI BURLESON, RN, MSN, NP-BC Nurse Practitioner, [facility] [employer], [facility] 05/05/2018 10:46 PM

## 2018-05-05 ENCOUNTER — Emergency Department (HOSPITAL_COMMUNITY)
Admission: EM | Admit: 2018-05-05 | Discharge: 2018-05-05 | Disposition: A | Discharge status: Home / Self Care | Payer: Medicaid Other | Attending: Emergency Medicine | Admitting: Emergency Medicine

## 2018-05-05 ENCOUNTER — Other Ambulatory Visit: Payer: Self-pay | Admitting: Nurse Practitioner

## 2018-05-05 ENCOUNTER — Other Ambulatory Visit: Payer: Self-pay

## 2018-05-05 ENCOUNTER — Encounter (HOSPITAL_COMMUNITY): Payer: Self-pay

## 2018-05-05 ENCOUNTER — Emergency Department (HOSPITAL_COMMUNITY): Payer: Medicaid Other

## 2018-05-05 DIAGNOSIS — R51 Headache: Secondary | ICD-10-CM | POA: Insufficient documentation

## 2018-05-05 DIAGNOSIS — R590 Localized enlarged lymph nodes: Secondary | ICD-10-CM | POA: Diagnosis not present

## 2018-05-05 DIAGNOSIS — Z87891 Personal history of nicotine dependence: Secondary | ICD-10-CM | POA: Insufficient documentation

## 2018-05-05 DIAGNOSIS — B269 Mumps without complication: Secondary | ICD-10-CM | POA: Insufficient documentation

## 2018-05-05 DIAGNOSIS — Z79899 Other long term (current) drug therapy: Secondary | ICD-10-CM | POA: Diagnosis not present

## 2018-05-05 LAB — CERVICOVAGINAL ANCILLARY ONLY
Bacterial vaginitis: POSITIVE — AB
Candida vaginitis: NEGATIVE
Chlamydia: NEGATIVE
Neisseria Gonorrhea: NEGATIVE
Trichomonas: NEGATIVE

## 2018-05-05 LAB — CBC WITH DIFFERENTIAL/PLATELET
ABS IMMATURE GRANULOCYTES: 0.06 10*3/uL (ref 0.00–0.07)
Basophils Absolute: 0.1 10*3/uL (ref 0.0–0.1)
Basophils Relative: 1 %
Eosinophils Absolute: 0.1 10*3/uL (ref 0.0–0.5)
Eosinophils Relative: 1 %
HCT: 41.7 % (ref 36.0–46.0)
Hemoglobin: 12.9 g/dL (ref 12.0–15.0)
IMMATURE GRANULOCYTES: 1 %
Lymphocytes Relative: 16 %
Lymphs Abs: 1.6 10*3/uL (ref 0.7–4.0)
MCH: 26.5 pg (ref 26.0–34.0)
MCHC: 30.9 g/dL (ref 30.0–36.0)
MCV: 85.6 fL (ref 80.0–100.0)
Monocytes Absolute: 1 10*3/uL (ref 0.1–1.0)
Monocytes Relative: 10 %
NEUTROS PCT: 71 %
Neutro Abs: 7.1 10*3/uL (ref 1.7–7.7)
Platelets: 260 10*3/uL (ref 150–400)
RBC: 4.87 MIL/uL (ref 3.87–5.11)
RDW: 15.6 % — ABNORMAL HIGH (ref 11.5–15.5)
WBC: 9.9 10*3/uL (ref 4.0–10.5)
nRBC: 0 % (ref 0.0–0.2)

## 2018-05-05 LAB — INFLUENZA PANEL BY PCR (TYPE A & B)
Influenza A By PCR: NEGATIVE
Influenza B By PCR: NEGATIVE

## 2018-05-05 LAB — COMPREHENSIVE METABOLIC PANEL
ALBUMIN: 4 g/dL (ref 3.5–5.0)
ALT: 25 U/L (ref 0–44)
AST: 26 U/L (ref 15–41)
Alkaline Phosphatase: 48 U/L (ref 38–126)
Anion gap: 10 (ref 5–15)
BUN: 11 mg/dL (ref 6–20)
CO2: 23 mmol/L (ref 22–32)
Calcium: 8.7 mg/dL — ABNORMAL LOW (ref 8.9–10.3)
Chloride: 103 mmol/L (ref 98–111)
Creatinine, Ser: 0.57 mg/dL (ref 0.44–1.00)
GFR calc Af Amer: >60 mL/min (ref >60)
GFR calc non Af Amer: >60 mL/min (ref >60)
Glucose, Bld: 97 mg/dL (ref 70–99)
Potassium: 4.1 mmol/L (ref 3.5–5.1)
Sodium: 136 mmol/L (ref 135–145)
Total Bilirubin: 0.2 mg/dL — ABNORMAL LOW (ref 0.3–1.2)
Total Protein: 7.7 g/dL (ref 6.5–8.1)

## 2018-05-05 LAB — GROUP A STREP BY PCR: Group A Strep by PCR: NOT DETECTED

## 2018-05-05 LAB — I-STAT BETA HCG BLOOD, ED (MC, WL, AP ONLY): I-stat hCG, quantitative: <5 m[IU]/mL (ref <5)

## 2018-05-05 LAB — MONONUCLEOSIS SCREEN: Mono Screen: NEGATIVE

## 2018-05-05 MED ORDER — SODIUM CHLORIDE (PF) 0.9 % IJ SOLN
INTRAMUSCULAR | Status: AC
  Filled 2018-05-05: qty 50

## 2018-05-05 MED ORDER — SODIUM CHLORIDE 0.9 % IV SOLN
INTRAVENOUS | Status: DC
  Administered 2018-05-05: 11:00:00 via INTRAVENOUS

## 2018-05-05 MED ORDER — METRONIDAZOLE 0.75 % VA GEL: 1.0000 | Freq: Every day | VAGINAL | 0 refills | Status: AC

## 2018-05-05 MED ORDER — IOHEXOL 300 MG/ML  SOLN
75.0000 mL | Freq: Once | INTRAMUSCULAR | Status: AC | PRN
  Administered 2018-05-05: 75 mL via INTRAVENOUS

## 2018-05-05 MED ORDER — KETOROLAC TROMETHAMINE 15 MG/ML IJ SOLN
15.0000 mg | Freq: Once | INTRAMUSCULAR | Status: AC
  Administered 2018-05-05: 15 mg via INTRAVENOUS
  Filled 2018-05-05: qty 1

## 2018-05-05 NOTE — Discharge Instructions (Signed)
Follow-up as per health department and infectious disease.  Return for any new or worse symptoms.  Recommend symptomatic treatment with Motrin for any discomfort.  Can also take Tylenol.  Work note provided.

## 2018-05-05 NOTE — ED Provider Notes (Signed)
[facility]-EMER[facility]PT Provider Note   CSN: [id number] Arrival date & time: 05/05/18  0707     History   Chief Complaint Chief Complaint  Patient presents with  . Lymphadenopathy  . Headache  . ear itchiness    HPI [facility] Hose Danille Oppedisano is a 33 y.o. female.  Patient presenting with bilateral facial swelling.  Will bit of ear itchiness and discomfort right in front of her ears.  Has had a headache for 2 days.  Patient clinically appears of may be mumps.  No known exposure.  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  And also recently started intending the community college.  Patient believes she had immunizations as a child.  She does recall having chickenpox.  But has not had the mumps as far she knows.  The headache is mild and not severe there is no neck stiffness.  No fevers.     Past Medical History:  Diagnosis Date  . Abnormal Pap smear   . 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 Dysplasia of cervix, low grade (CIN 1)   . HPV (human papilloma virus) anogenital infection     Patient Active Problem List   Diagnosis Date Noted  . Bacterial vaginosis 03/07/2018  . Morbid obesity with BMI of 40.0-44.9, adult (HCC) 11/30/2017  . PCOS (polycystic ovarian syndrome) 11/30/2017  . Low grade squamous intraepithelial lesion on cytologic smear of cervix (LGSIL) 05/21/2017  . 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 Breast lump on right side at 10 o'clock position 01/31/2013  . Abnormal uterine bleeding 09/08/2012    Past Surgical History:  Procedure Laterality Date  . COLPOSCOPY    . CRYOTHERAPY  06/08/2017   cervix     OB History    Gravida  3   Para  2   Term  2   Preterm  0   AB  1   Living  2     SAB  0   TAB  1   Ectopic  0   Multiple  0   Live Births  2            Home Medications    Prior to Admission medications     Medication Sig Start Date End Date Taking? Authorizing Provider  ibuprofen (ADVIL,MOTRIN) 800 MG tablet Take 1 tablet (800 mg total) by mouth 3 (three) times daily. Patient taking differently: Take 400-800 mg by mouth 3 (three) times daily as needed for headache or mild pain.  12/02/17  Yes Eustace Moore, MD  cyclobenzaprine (FLEXERIL) 5 MG tablet Take 1 tablet (5 mg total) by mouth 3 (three) times daily as needed for muscle spasms. Patient not taking: Reported on 01/19/2018 12/02/17   Eustace Moore, MD  medroxyPROGESTERone (PROVERA) 10 MG tablet Take 1 tablet (10 mg total) by mouth daily. Take for 2 weeks and then repeat again in one month. Patient not taking: Reported on 02/15/2018 01/19/18   Currie Paris, NP  metroNIDAZOLE (FLAGYL) 500 MG tablet Take 1 tablet (500 mg total) by mouth 2 (two) times daily. Patient not taking: Reported on 05/05/2018 03/27/18   Liberty Handy, PA-C    Family History Family History  Problem Relation Age of Onset  . Hypertension Father   . Diabetes Father   . Heart attack Father   . Hypertension Mother     Social History Social History   Tobacco Use  .  Smoking status: Former Smoker    Packs/day: 0.25    Types: Cigarettes    Last attempt to quit: 11/22/2017    Years since quitting: 0.4  . Smokeless tobacco: Never Used  . Tobacco comment: quit Aug 2019  Substance Use Topics  . Alcohol use: Yes    Comment: occasionally  . Drug use: No     Allergies   Patient has no known allergies.   Review of Systems Review of Systems  Constitutional: Negative for chills and fever.  HENT: Positive for facial swelling. Negative for rhinorrhea and sore throat.   Eyes: Negative for redness and visual disturbance.  Respiratory: Negative for cough and shortness of breath.   Cardiovascular: Negative for chest pain and leg swelling.  Gastrointestinal: Negative for abdominal pain, diarrhea, nausea and vomiting.  Genitourinary: Negative for dysuria.   Musculoskeletal: Negative for back pain, neck pain and neck stiffness.  Skin: Negative for rash.  Neurological: Negative for dizziness, light-headedness and headaches.  Hematological: Does not bruise/bleed easily.  Psychiatric/Behavioral: Negative for confusion.     Physical Exam Updated Vital Signs BP (!) 158/104   Pulse 85   Temp 97.8 F (36.6 C) (Oral)   Resp 18   Ht 1.6 m (5\' 3" )   Wt 117.9 kg   LMP 03/20/2018 Comment: negative HCG blood test 05-05-2018  SpO2 99%   BMI 46.06 kg/m   Physical Exam Vitals signs and nursing note reviewed.  Constitutional:      General: She is not in acute distress.    Appearance: She is well-developed.  HENT:     Head: Normocephalic and atraumatic.     Comments: Patient with bilateral facial swelling.  Count of over the parotid glands.  No cervical adenopathy.  No erythema.  Not tender to touch.    Right Ear: Tympanic membrane, ear canal and external ear normal.     Left Ear: Tympanic membrane, ear canal and external ear normal.     Nose: No congestion.     Mouth/Throat:     Mouth: Mucous membranes are moist.     Pharynx: Oropharynx is clear. No oropharyngeal exudate or posterior oropharyngeal erythema.     Comments: Uvula midline no tonsillar enlargement.  No exudate no erythema. Eyes:     General:        Right eye: No discharge.        Left eye: No discharge.     Extraocular Movements: Extraocular movements intact.     Conjunctiva/sclera: Conjunctivae normal.     Pupils: Pupils are equal, round, and reactive to light.  Neck:     Musculoskeletal: Normal range of motion and neck supple. No neck rigidity.  Cardiovascular:     Rate and Rhythm: Normal rate and regular rhythm.     Heart sounds: No murmur.  Pulmonary:     Effort: Pulmonary effort is normal. No respiratory distress.     Breath sounds: Normal breath sounds.  Abdominal:     Palpations: Abdomen is soft.     Tenderness: There is no abdominal tenderness.  Musculoskeletal:  Normal range of motion.        General: No swelling.  Lymphadenopathy:     Cervical: No cervical adenopathy.  Skin:    General: Skin is warm and dry.     Capillary Refill: Capillary refill takes less than 2 seconds.  Neurological:     General: No focal deficit present.     Mental Status: She is alert and oriented to person, place,  and time.     Cranial Nerves: No cranial nerve deficit.      ED Treatments / Results  Labs (all labs ordered are listed, but only abnormal results are displayed) Labs Reviewed  COMPREHENSIVE METABOLIC PANEL - Abnormal; Notable for the following components:      Result Value   Calcium 8.7 (*)    Total Bilirubin 0.2 (*)    All other components within normal limits  CBC WITH DIFFERENTIAL/PLATELET - Abnormal; Notable for the following components:   RDW 15.6 (*)    All other components within normal limits  GROUP A STREP BY PCR  INFLUENZA PANEL BY PCR (TYPE A & B)  MONONUCLEOSIS SCREEN  MUMPS ANTIBODY, IGG  MUMPS ANTIBODY, IGM  I-STAT BETA HCG BLOOD, ED ([facility], [facility], [facility] ONLY)    EKG None  Radiology Ct Soft Tissue Neck W Contrast  Result Date: 05/05/2018 CLINICAL DATA:  Swollen lymph nodes on the right.  Headache. EXAM: CT NECK WITH CONTRAST TECHNIQUE: Multidetector CT imaging of the neck was performed using the standard protocol following the bolus administration of intravenous contrast. CONTRAST:  75mL OMNIPAQUE IOHEXOL 300 MG/ML  SOLN COMPARISON:  CT 04/22/2014. FINDINGS: Pharynx and larynx: No mucosal or submucosal lesion is seen. Salivary glands: Parotid glands are prominent but do not show a focal lesion. The glands are larger than were seen on the study 2016, and therefore there could be some pathologic parotid enlargement as might be seen with nonspecific parotiditis or autoimmune syndromes. Submandibular glands are likewise slightly prominent but do not show a focal lesion. Thyroid: Normal Lymph nodes: No enlarged or low-density nodes seen on either  side of the neck. Normal appearing bilateral nodes. Vascular: Normal Limited intracranial: Normal Visualized orbits: Normal Mastoids and visualized paranasal sinuses: Normal Skeleton: Mild midcervical spondylosis. Upper chest: Normal Other: None IMPRESSION: Generalized enlargement of the parotid glands when compared to the study of 2016. Lesser enlargement of the submandibular glands. The findings could be due to sialoadenitis and/or autoimmune syndromes. Electronically Signed   By: Paulina FusiMark  Shogry M.D.   On: 05/05/2018 12:25    Procedures Procedures (including critical care time)  Medications Ordered in ED Medications  0.9 %  sodium chloride infusion ( Intravenous New Bag/Given 05/05/18 1036)  sodium chloride (PF) 0.9 % injection (has no administration in time range)  iohexol (OMNIPAQUE) 300 MG/ML solution 75 mL (75 mLs Intravenous Contrast Given 05/05/18 1151)     Initial Impression / Assessment and Plan / ED Course  I have reviewed the triage vital signs and the nursing notes.  Pertinent labs & imaging results that were available during my care of the patient were reviewed by me and considered in my medical decision making (see chart for details).     Clinically patient seems that she may have the mumps.  CT soft tissue neck showed bilateral parotid gland and submandibular gland swelling.  No adenopathy.  No pharyngeal or any airway compromise.  Patient nontoxic no acute distress.  No known contact with mumps.  Contacted infectious disease of the clinical concern.  They recommended antibody IgG and IgM.  As well as some viral swab testing.  They also will contact the health department regarding her exposures.  Awaiting final say from infectious disease on whether patient is ready for discharge.  They also did want some additional testing like Monospot testing.  Looks like her mononucleosis testing was negative.  Other testing is still in process.  Clinically patient is stable for discharge  home seems  to have no complicating factors secondary to the mumps.  No evidence of meningitis.  No abdominal or pelvic pain.  So suggest orchitis.  Patient can treat herself clinically with Motrin or Tylenol.  Taken out of work until next weekend.  Given referral information for follow-up with infectious disease.  Patient turned over to Dr. Jeraldine LootsLockwood.  Disposition and follow-up as per infectious disease.  Spoke to Jacobs EngineeringCynthia Snyder.  Final Clinical Impressions(s) / ED Diagnoses   Final diagnoses:  Mumps without complication    ED Discharge Orders    None       Vanetta MuldersZackowski, Gayathri Futrell, MD 05/05/18 1720

## 2018-05-05 NOTE — Progress Notes (Signed)
BV noted on self swab lab results.  Reviewed previous notes and prescribed Metrogel.  Clinical staff to notify client.   Nolene Bernheim, RN, MSN, NP-BC Nurse Practitioner, [facility] for [employer], [facility] 05/05/2018 10:35 PM

## 2018-05-05 NOTE — ED Triage Notes (Signed)
Patient c/o right lymph gland swelling and pain, ear itchiness, and a headache since yesterday.

## 2018-05-06 ENCOUNTER — Telehealth: Payer: Self-pay

## 2018-05-06 LAB — MUMPS ANTIBODY, IGM

## 2018-05-06 LAB — MUMPS ANTIBODY, IGG: Mumps IgG: 130 AU/mL (ref >10.9)

## 2018-05-06 NOTE — Telephone Encounter (Addendum)
-----   Message from Currie Paris, NP sent at 05/05/2018 10:35 PM EST ----- Please notify patient of results.  Will prescribe Metrogel as that is what she requested in November, but if she wants another medication, is OK to prescribe Metronidazole, clindamycin or tinadizole.  Notified pt of results and tx has been sent to her [facility] pharmacy on [address].

## 2018-05-11 LAB — MISCELLANEOUS TEST: Miscellaneous Test: 70172

## 2018-05-18 ENCOUNTER — Encounter: Payer: Self-pay | Admitting: Family

## 2018-05-18 ENCOUNTER — Ambulatory Visit (INDEPENDENT_AMBULATORY_CARE_PROVIDER_SITE_OTHER): Payer: Medicaid Other | Admitting: Family

## 2018-05-18 VITALS — BP 161/112 | HR 81 | Temp 97.8°F | Ht 63.0 in | Wt 271.0 lb

## 2018-05-18 DIAGNOSIS — B269 Mumps without complication: Secondary | ICD-10-CM | POA: Diagnosis not present

## 2018-05-18 DIAGNOSIS — I10 Essential (primary) hypertension: Secondary | ICD-10-CM

## 2018-05-18 NOTE — Assessment & Plan Note (Signed)
Julie Hendrix has elevated blood pressure on several occasions which is likely multifactorial and consistent with essential hypertension. No current symptoms of end organ damage at present. Discussed importance of seeking care to lower blood pressure. She is working on finding a primary care provider and will be calling tomorrow. Advised to seek further care if she develops headaches, changes in vision, shortness of breath or chest pain.

## 2018-05-18 NOTE — Assessment & Plan Note (Signed)
Julie Hendrix was positive for mumps via PCR and has no further symptoms since leaving the ED and wearing a mask. Discussed the etiology, transmission, complications and treatment for mumps. All questions answered. No further treatment or follow up with ID is needed at this time.

## 2018-05-18 NOTE — Patient Instructions (Signed)
Nice to meet you.  It appears your mumps has resolved with no complications.   Recommend follow up with primary care for your blood pressure.  Follow up with ID as needed.

## 2018-05-18 NOTE — Progress Notes (Signed)
Subjective:    Patient ID: Julie Hendrix, female    DOB: Aug 21, 1985, 33 y.o.   MRN: [id number]  Chief Complaint  Patient presents with  . Mumps    HPI:  Julie Hendrix is a 33 y.o. female who presents today for office visit following an ED visit.  Julie Hendrix was recently evaluated in the ED with the chief complaint of bilateral facial swelling, ear itchiness and discomfort, and a headache that had been going on for 2 days. She works at the [employer] where there was mumps present. Influenza, Group A strep and mono were all negative. ID was contacted and she was discharged from the ED being out of work for 1 week. IgM testing was negative at the time with a positive IgG. PCR testing was positive confirming mumps.   Since leaving the ED she was contacted that she was positive for mumps and continued to wear a mask for the recommended 5 days. Symptoms have gradually resolved and denies any current changes in hearing, headaches, fevers or chills.   No Known Allergies    Outpatient Medications Prior to Visit  Medication Sig Dispense Refill  . cyclobenzaprine (FLEXERIL) 5 MG tablet Take 1 tablet (5 mg total) by mouth 3 (three) times daily as needed for muscle spasms. (Patient not taking: Reported on 01/19/2018) 30 tablet 0  . ibuprofen (ADVIL,MOTRIN) 800 MG tablet Take 1 tablet (800 mg total) by mouth 3 (three) times daily. (Patient not taking: Reported on 05/18/2018) 21 tablet 0  . medroxyPROGESTERone (PROVERA) 10 MG tablet Take 1 tablet (10 mg total) by mouth daily. Take for 2 weeks and then repeat again in one month. (Patient not taking: Reported on 02/15/2018) 14 tablet 2   No facility-administered medications prior to visit.      Past Medical History:  Diagnosis Date  . Abnormal Pap smear   . 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 Dysplasia of cervix, low grade (CIN 1)   . HPV (human  papilloma virus) anogenital infection       Past Surgical History:  Procedure Laterality Date  . COLPOSCOPY    . CRYOTHERAPY  06/08/2017   cervix      Family History  Problem Relation Age of Onset  . Hypertension Father   . Diabetes Father   . Heart attack Father   . Hypertension Mother       Social History   Socioeconomic History  . Marital status: Single    Spouse name: Not on file  . Number of children: Not on file  . Years of education: Not on file  . Highest education level: Not on file  Occupational History  . Not on file  Social Needs  . Financial resource strain: Not on file  . Food insecurity:    Worry: Not on file    Inability: Not on file  . Transportation needs:    Medical: Not on file    Non-medical: Not on file  Tobacco Use  . Smoking status: Former Smoker    Packs/day: 0.25    Types: Cigarettes    Last attempt to quit: 11/22/2017    Years since quitting: 0.4  . Smokeless tobacco: Never Used  . Tobacco comment: quit Aug 2019  Substance and Sexual Activity  . Alcohol use: Yes    Comment: occasionally  . Drug use: No  . Sexual activity: Yes    Birth control/protection: None  Lifestyle  . Physical activity:    Days per week: 0 days    Minutes per session: 0 min  . Stress: Not at all  Relationships  . Social connections:    Talks on phone: More than three times a week    Gets together: Once a week    Attends religious service: More than 4 times per year    Active member of club or organization: No    Attends meetings of clubs or organizations: Never    Relationship status: Never married  . Intimate partner violence:    Fear of current or ex partner: No    Emotionally abused: No    Physically abused: No    Forced sexual activity: No  Other Topics Concern  . Not on file  Social History Narrative  . Not on file      Review of Systems  Constitutional: Negative for chills, fatigue and fever.  HENT: Negative for congestion, ear  pain and facial swelling.   Respiratory: Negative for cough, chest tightness, shortness of breath and wheezing.   Cardiovascular: Negative for chest pain and palpitations.  Neurological: Negative for dizziness, weakness, numbness and headaches.       Objective:    BP (!) 161/112   Pulse 81   Temp 97.8 F (36.6 C) (Oral)   Ht 5\' 3"  (1.6 m)   Wt 271 lb (122.9 kg)   BMI 48.01 kg/m  Nursing note and vital signs reviewed.  Physical Exam Constitutional:      General: She is not in acute distress.    Appearance: She is well-developed. She is obese.  Neck:     Musculoskeletal: Neck supple.  Cardiovascular:     Rate and Rhythm: Normal rate and regular rhythm.     Heart sounds: Normal heart sounds.  Pulmonary:     Effort: Pulmonary effort is normal.     Breath sounds: Normal breath sounds.  Lymphadenopathy:     Cervical: No cervical adenopathy.  Skin:    General: Skin is warm and dry.  Neurological:     Mental Status: She is alert.  Psychiatric:        Mood and Affect: Mood normal.         Assessment & Plan:   Problem List Items Addressed This Visit      Cardiovascular and Mediastinum   Essential hypertension    Julie Hendrix has elevated blood pressure on several occasions which is likely multifactorial and consistent with essential hypertension. No current symptoms of end organ damage at present. Discussed importance of seeking care to lower blood pressure. She is working on finding a primary care provider and will be calling tomorrow. Advised to seek further care if she develops headaches, changes in vision, shortness of breath or chest pain.         Other   Mumps without complication - Primary    Julie Hendrix was positive for mumps via PCR and has no further symptoms since leaving the ED and wearing a mask. Discussed the etiology, transmission, complications and treatment for mumps. All questions answered. No further treatment or follow up with ID is needed at this time.            I am having Kurtis BushmanAlexis M. Barrie maintain her ibuprofen, cyclobenzaprine, and medroxyPROGESTERone.   Follow-up: Return if symptoms worsen or fail to improve.    Marcos EkeGreg Calone, MSN, FNP-C Nurse Practitioner [facility] Office phone: [phone] Pager:  [phone] [facility] Main number: [phone]

## 2018-05-23 ENCOUNTER — Other Ambulatory Visit (HOSPITAL_COMMUNITY): Payer: Self-pay | Admitting: Obstetrics and Gynecology

## 2018-05-23 ENCOUNTER — Other Ambulatory Visit: Payer: Self-pay | Admitting: Obstetrics and Gynecology

## 2018-05-23 ENCOUNTER — Ambulatory Visit
Admission: RE | Admit: 2018-05-23 | Discharge: 2018-05-23 | Disposition: A | Discharge status: Home / Self Care | Payer: Medicaid Other | Source: Ambulatory Visit | Attending: Obstetrics and Gynecology | Admitting: Obstetrics and Gynecology

## 2018-05-23 DIAGNOSIS — N641 Fat necrosis of breast: Secondary | ICD-10-CM

## 2018-05-23 DIAGNOSIS — R928 Other abnormal and inconclusive findings on diagnostic imaging of breast: Secondary | ICD-10-CM | POA: Diagnosis not present

## 2018-05-23 DIAGNOSIS — N6320 Unspecified lump in the left breast, unspecified quadrant: Secondary | ICD-10-CM

## 2018-05-23 DIAGNOSIS — N6322 Unspecified lump in the left breast, upper inner quadrant: Secondary | ICD-10-CM | POA: Diagnosis not present

## 2018-05-26 ENCOUNTER — Ambulatory Visit
Admission: RE | Admit: 2018-05-26 | Discharge: 2018-05-26 | Disposition: A | Discharge status: Home / Self Care | Payer: Medicaid Other | Source: Ambulatory Visit | Attending: Obstetrics and Gynecology | Admitting: Obstetrics and Gynecology

## 2018-05-26 DIAGNOSIS — N6320 Unspecified lump in the left breast, unspecified quadrant: Secondary | ICD-10-CM

## 2018-05-26 DIAGNOSIS — N6322 Unspecified lump in the left breast, upper inner quadrant: Secondary | ICD-10-CM | POA: Diagnosis not present

## 2018-05-26 DIAGNOSIS — N641 Fat necrosis of breast: Secondary | ICD-10-CM | POA: Diagnosis not present

## 2018-06-02 ENCOUNTER — Other Ambulatory Visit: Payer: Self-pay

## 2018-06-02 ENCOUNTER — Ambulatory Visit (INDEPENDENT_AMBULATORY_CARE_PROVIDER_SITE_OTHER): Payer: Medicaid Other | Admitting: Family Medicine

## 2018-06-02 ENCOUNTER — Encounter: Payer: Self-pay | Admitting: Family Medicine

## 2018-06-02 VITALS — BP 150/106 | HR 89 | Temp 97.8°F | Ht 63.0 in | Wt 281.6 lb

## 2018-06-02 DIAGNOSIS — N871 Moderate cervical dysplasia: Secondary | ICD-10-CM

## 2018-06-02 DIAGNOSIS — Z6841 Body Mass Index (BMI) 40.0 and over, adult: Secondary | ICD-10-CM | POA: Diagnosis not present

## 2018-06-02 DIAGNOSIS — I10 Essential (primary) hypertension: Secondary | ICD-10-CM

## 2018-06-02 MED ORDER — AMLODIPINE BESYLATE 5 MG PO TABS: 5.0000 mg | ORAL_TABLET | Freq: Every day | ORAL | 0 refills | Status: DC

## 2018-06-02 NOTE — Progress Notes (Signed)
Subjective:    Patient ID: Calla Kicks, female    DOB: 30-Sep-1985, 33 y.o.   MRN: [id number]   CC: Establish care   HPI: Ms. Drilling is a 33 year old female presenting to establish care and discuss the following:  Hypertension: Hypertension since approximately 2018.  Has not seen a primary care doctor in the past.  Not currently on any medications, hesitant to start.  She denies any associated lightheadedness/dizziness, visual changes, headache, shortness of breath, chest pains.  She is also interested in weight loss.  Note she gained more weight after she quit smoking in August 2019.  Family history of high blood pressure and CAD.  Risk including obesity, previous smoker, and family history.  Currently drinks several sodas throughout the day and does not have any physical activity in her daily regimen.  Often skips breakfast, very hungry once lunch comes around.  Past medical history: Essential hypertension, mumps in 04/2018, CIN-2, fat necrosis of breast   Social history: Has 2 children age 18 and 70.  Does not exercise regularly.  Previously smoked cigarettes, quit in August 2019.  Denies any recreational drug use.  Drinks an alcoholic beverage approximately once daily.  Enjoys going to the bar to play pool and dance for fun.  Healthcare maintenance: Up-to-date on vaccinations.  Needs Pap smear with HPV this month, follows with OB/GYN and going to make appointment for this.   Smoking status reviewed  Review of Systems Per HPI, also denies recent illness, fever, headache, changes in vision, chest pain, shortness of breath, abdominal pain, N/V/D, weakness   Patient Active Problem List   Diagnosis Date Noted  . Mumps without complication 05/18/2018  . Essential hypertension 05/18/2018  . BMI 45.0-49.9, adult (HCC) 11/30/2017  . PCOS (polycystic ovarian syndrome) 11/30/2017  . Low grade squamous intraepithelial lesion on cytologic smear of cervix (LGSIL) 05/21/2017  . CIN  II (cervical intraepithelial neoplasia II) 05/21/2017  . Breast lump on right side at 10 o'clock position 01/31/2013  . Abnormal uterine bleeding 09/08/2012     Objective:  BP (!) 150/106   Pulse 89   Temp 97.8 F (36.6 C) (Oral)   Ht 5\' 3"  (1.6 m)   Wt 281 lb 9.6 oz (127.7 kg)   SpO2 98%   BMI 49.88 kg/m  Vitals and nursing note reviewed  General: NAD, pleasant HEENT: NCAT, MMM, EOMI Cardiac: RRR, normal heart sounds Respiratory: CTAB, normal effort Abdomen: soft large abdomen, nontender, nondistended, normoactive bowel sounds Extremities: no edema or cyanosis. WWP.  No lower extremity swelling bilaterally, palpable posterior tibialis and dorsalis pedis pulses bilaterally. Skin: warm and dry, no rashes noted Neuro: alert and oriented, no focal deficits Psych: normal affect  Assessment & Plan:   Essential hypertension Chronic and asymptomatic, not currently on any therapy.  Likely multifactorial in setting of family history and body habitus.  Renal function normal in 04/2018. - Patient initially hesitant on starting medications, compromised with starting one medication at this time.  Started on Norvasc 5, may increase to 10 mg in a few weeks if blood pressure remains >140/90 and tolerating well.  Counseled on common side effect of lower extremity swelling. -Nurses visit in approximately 2 weeks to evaluate her home cuff and monitor blood pressure -Has blood pressure cuff at home, going to check her blood pressure daily and keep a journal to bring in her next appointment - Interested in weight loss, discussed below - Due to hypertension and obesity, will screen for diabetes and  hyperlipidemia with A1c and lipid panel - Follow-up in approximately 1 month, return to care precautions were extensively discussed  CIN II (cervical intraepithelial neoplasia II) S/p cryotherapy in 05/2017. Patient to make an appointment with her OB/GYN for Pap smear with HPV in the near future.   BMI  45.0-49.9, adult (HCC)  Interested in weight loss, provided handout on health tips/physical activity.  Patient is going to start few journaling with my fitness pal and create realistic goals for her to look forward to.  She is also going to start adding approximately 30 minutes of physical activity into her day and slowly reduce amount of sodas in her diet.  Additionally, she is going to start having something small for breakfast, ex hard-boiled eggs or yogurt with toast, to hold her over until lunch so that she is not starving.  Follow-up in 1 month for hypertension, or sooner if needed.  Leticia Penna, DO Family Medicine Resident PGY-1

## 2018-06-02 NOTE — Assessment & Plan Note (Addendum)
S/p cryotherapy in 05/2017. Patient to make an appointment with her OB/GYN for Pap smear with HPV in the near future.

## 2018-06-02 NOTE — Patient Instructions (Addendum)
It was so nice meeting you today!  We are going to check for diabetes and check your lipids, I will let you know those results.  Additionally, start monitoring your blood pressure daily at home and keep a journal of this, sit for a few minutes with both feet on the ground and arm relaxed.  We will be starting amlodipine, blood pressure medication, today at 5 mg.  You can increase this to 10 mg after a few weeks if your blood pressure continues to remain >140/90.  Major side effect to monitor for is lower extremity swelling.  I would like to see you back in 1 month.  Please return to care sooner if you start having lightheadedness/dizziness, vision changes, severe headache, shortness of breath, or your blood pressure goes over 180/120 consistently.  For weight loss, see additional pamphlet I provided you.  I also recommend you start food journaling with my fitness pal app, this will keep you accountable.  I also recommend you make realistic goals for yourself to look for to.

## 2018-06-02 NOTE — Assessment & Plan Note (Signed)
Interested in weight loss, provided handout on health tips/physical activity.  Patient is going to start few journaling with my fitness pal and create realistic goals for her to look forward to.  She is also going to start adding approximately 30 minutes of physical activity into her day and slowly reduce amount of sodas in her diet.  Additionally, she is going to start having something small for breakfast, ex hard-boiled eggs or yogurt with toast, to hold her over until lunch so that she is not starving.

## 2018-06-02 NOTE — Assessment & Plan Note (Addendum)
Chronic and asymptomatic, not currently on any therapy.  Likely multifactorial in setting of family history and body habitus.  Renal function normal in 04/2018. - Patient initially hesitant on starting medications, compromised with starting one medication at this time.  Started on Norvasc 5, may increase to 10 mg in a few weeks if blood pressure remains >140/90 and tolerating well.  Counseled on common side effect of lower extremity swelling. -Nurses visit in approximately 2 weeks to evaluate her home cuff and monitor blood pressure -Has blood pressure cuff at home, going to check her blood pressure daily and keep a journal to bring in her next appointment - Interested in weight loss, discussed below - Due to hypertension and obesity, will screen for diabetes and hyperlipidemia with A1c and lipid panel - Follow-up in approximately 1 month, return to care precautions were extensively discussed

## 2018-06-03 LAB — LIPID PANEL
Chol/HDL Ratio: 3.7 ratio (ref 0.0–4.4)
Cholesterol, Total: 137 mg/dL (ref 100–199)
HDL: 37 mg/dL — ABNORMAL LOW (ref >39)
LDL Calculated: 57 mg/dL (ref 0–99)
Triglycerides: 217 mg/dL — ABNORMAL HIGH (ref 0–149)
VLDL CHOLESTEROL CAL: 43 mg/dL — AB (ref 5–40)

## 2018-06-03 LAB — HEMOGLOBIN A1C
Est. average glucose Bld gHb Est-mCnc: 126 mg/dL
Hgb A1c MFr Bld: 6 % — ABNORMAL HIGH (ref 4.8–5.6)

## 2018-06-17 ENCOUNTER — Encounter: Payer: Self-pay | Admitting: Family Medicine

## 2018-06-17 ENCOUNTER — Ambulatory Visit: Payer: Medicaid Other | Admitting: Family Medicine

## 2018-06-17 DIAGNOSIS — I10 Essential (primary) hypertension: Secondary | ICD-10-CM

## 2018-06-17 DIAGNOSIS — R7303 Prediabetes: Secondary | ICD-10-CM | POA: Diagnosis not present

## 2018-06-17 DIAGNOSIS — Z6841 Body Mass Index (BMI) 40.0 and over, adult: Secondary | ICD-10-CM

## 2018-06-17 NOTE — Assessment & Plan Note (Signed)
Hgb A1c 6.0 on 2/13.  Patient is working towards lifestyle changes and weight loss.  Will monitor.

## 2018-06-17 NOTE — Assessment & Plan Note (Addendum)
Down about 6 lbs. Congratulated patient on significant lifestyle modifications she has already started.  Encouraged her to keep this up.  Additionally discussed trying to use myfitnesspal to help with accountability.

## 2018-06-17 NOTE — Progress Notes (Signed)
Subjective:    Patient ID: Julie Hendrix, female    DOB: 03-Jun-1985, 33 y.o.   MRN: [id number]  CC: BP f/u   HPI: Julie Hendrix is a 33 year old female presenting to discuss the following:  Hypertension: Seen on 2/13 and prescribed amlodipine 5 mg.  Patient did not start taking medication until 2/17 and has actually been taking medicine approximately every other day.  Has not taken it today, she was worried since she drank with friends last night that this would have interactions.  Has home blood pressure cuff, has only been able to take her blood pressure once at home, it was 138/86 at that time.  Brought her BP cuff to office today, is considering getting a new one. She is scared about going up to 10 mg of medication, especially as she has not been taking it every day.  Denies any lightheadedness/dizziness, blurred vision, headache.   Of note, last visit we talked about weight loss and lifestyle changes.  She has made significant improvement in the last 2 weeks, has already lost about 5-6 pounds.  She has been drinking more water and decreasing amount of sodas throughout the day.  Started adding walking, has been walking about 30 minutes 5 days a week.  Additionally, she has been trying to cut out poor starches (fries, chips).  She is feeling really good.  Smoking status reviewed  Review of Systems Per HPI, also denies recent illness, fever, headache, changes in vision, chest pain, shortness of breath, abdominal pain, N/V/D, weakness   Patient Active Problem List   Diagnosis Date Noted  . Prediabetes 06/17/2018  . Mumps without complication 05/18/2018  . Essential hypertension 05/18/2018  . BMI 45.0-49.9, adult (HCC) 11/30/2017  . PCOS (polycystic ovarian syndrome) 11/30/2017  . Low grade squamous intraepithelial lesion on cytologic smear of cervix (LGSIL) 05/21/2017  . 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 Breast lump on right side at 10 o'clock  position 01/31/2013  . Abnormal uterine bleeding 09/08/2012     Objective:  BP (!) 148/92   Pulse 100   Temp 98.1 F (36.7 C) (Oral)   Wt 276 lb (125.2 kg)   SpO2 98%   BMI 48.89 kg/m  Vitals and nursing note reviewed  General: NAD, pleasant young female sitting comfortably Cardiac: RRR, normal heart sounds, no murmurs Respiratory: CTAB, normal effort Abdomen: soft large abdomen, nontender, nondistended Extremities: no edema or cyanosis. WWP. Skin: warm and dry, no rashes noted Neuro: alert and oriented, no focal deficits Psych: normal affect  Assessment & Plan:   Essential hypertension Uncontrolled. 148/92 in the office, recheck 156/86 during appointment.  Started amlodipine 5 mg last week, has not been taking daily.  Reassured and comforted patient that this medication is appropriate for daily use and does not have to worry about interactions with what she eats/drinks.  Although she is only been on 5 mg for a short period of time, believe we could increase to 10 mg as she has stage II hypertension, however patient is extremely nervous about this and is wanting to take the 5 mg daily for a little while longer.  Discussed importance of blood pressure control. - Continue amlodipine 5 mg, take daily - Home blood pressure cuff read comparable to our reading in the office today, to check blood pressure at home every other day and keep a journal to bring into appointments -Continue lifestyle changes as discussed below - BP nurses visit in 2 weeks, patient states  she would feel comfortable if her blood pressure is still elevated at that time to go up to the 10 mg-this will likely happen - Follow-up with me in approximately 1 month - Return to care precautions discussed  BMI 45.0-49.9, adult (HCC) Down about 6 lbs. Congratulated patient on significant lifestyle modifications she has already started.  Encouraged her to keep this up.  Additionally discussed trying to use myfitnesspal to  help with accountability.   Prediabetes Hgb A1c 6.0 on 2/13.  Patient is working towards lifestyle changes and weight loss.  Will monitor.   Julie Penna, DO Family Medicine Resident PGY-1

## 2018-06-17 NOTE — Assessment & Plan Note (Signed)
Uncontrolled. 148/92 in the office, recheck 156/86 during appointment.  Started amlodipine 5 mg last week, has not been taking daily.  Reassured and comforted patient that this medication is appropriate for daily use and does not have to worry about interactions with what she eats/drinks.  Although she is only been on 5 mg for a short period of time, believe we could increase to 10 mg as she has stage II hypertension, however patient is extremely nervous about this and is wanting to take the 5 mg daily for a little while longer.  Discussed importance of blood pressure control. - Continue amlodipine 5 mg, take daily - Home blood pressure cuff read comparable to our reading in the office today, to check blood pressure at home every other day and keep a journal to bring into appointments -Continue lifestyle changes as discussed below - BP nurses visit in 2 weeks, patient states she would feel comfortable if her blood pressure is still elevated at that time to go up to the 10 mg-this will likely happen - Follow-up with me in approximately 1 month - Return to care precautions discussed

## 2018-06-17 NOTE — Patient Instructions (Signed)
It was so nice seeing you again, I am so proud of all the changes that you have made!  Keep them up!  For your blood pressure, you can absolutely take the amlodipine every single day regardless of what you have eaten/drink.  Additionally, you can take it morning or night, whichever time you feel you will be most compliant with.  We will have you come back in about 2 weeks for a nurses visit to check your blood pressure, if it is still elevated we will bump you up to 10 mg of your amlodipine.  Please make sure that you are checking your blood pressure daily at home so we can have a better sense of how high her blood pressures been ranging.  Keep a journal of this and bring it into all appointments (bring her cuff in next time as well to just make sure/especially if you buy a new one in the meantime).  I will see you in about 1 month or sooner if needed.

## 2018-07-01 ENCOUNTER — Other Ambulatory Visit: Payer: Self-pay

## 2018-07-01 ENCOUNTER — Telehealth: Payer: Self-pay | Admitting: Family Medicine

## 2018-07-01 ENCOUNTER — Ambulatory Visit (INDEPENDENT_AMBULATORY_CARE_PROVIDER_SITE_OTHER): Payer: Medicaid Other

## 2018-07-01 VITALS — BP 144/88

## 2018-07-01 DIAGNOSIS — I10 Essential (primary) hypertension: Secondary | ICD-10-CM

## 2018-07-01 NOTE — Telephone Encounter (Signed)
Noted patient's blood pressure during nurse evaluation at the clinic today.  SBP 150, 144 on repeat.  Called patient to discuss these findings, recommend she increase her amlodipine to 10 mg (instructed to take 2 of her 5mg  tablets for now).  Congratulated and encouraged her to continue her lifestyle modifications.  Will recheck her blood pressure at follow-up in approximately 2 weeks.  Allayne Stack, DO

## 2018-07-01 NOTE — Progress Notes (Signed)
   Patient in to nurse clinic for BP check. Has been taking Amlodipine 5 mg daily without error. Has taken dose today.  BP 150/92, recheck 5 mins later 144/88.  Patient said she had home readings last week of 150s/103 x 2.   Has had some HA relieved by Tylenol.   Wants to know if she would benefit from a "fluid pill" type BP med.  Ples Specter, RN [facility] RN)

## 2018-07-04 ENCOUNTER — Other Ambulatory Visit: Payer: Self-pay

## 2018-07-04 DIAGNOSIS — I10 Essential (primary) hypertension: Secondary | ICD-10-CM

## 2018-07-04 MED ORDER — AMLODIPINE BESYLATE 5 MG PO TABS: 5.0000 mg | ORAL_TABLET | Freq: Every day | ORAL | 0 refills | Status: DC

## 2018-07-06 ENCOUNTER — Telehealth: Payer: Self-pay

## 2018-07-06 ENCOUNTER — Ambulatory Visit: Payer: Medicaid Other | Admitting: Obstetrics & Gynecology

## 2018-07-06 DIAGNOSIS — H31092 Other chorioretinal scars, left eye: Secondary | ICD-10-CM | POA: Diagnosis not present

## 2018-07-06 DIAGNOSIS — H47322 Drusen of optic disc, left eye: Secondary | ICD-10-CM | POA: Diagnosis not present

## 2018-07-06 DIAGNOSIS — H43392 Other vitreous opacities, left eye: Secondary | ICD-10-CM | POA: Diagnosis not present

## 2018-07-06 DIAGNOSIS — N898 Other specified noninflammatory disorders of vagina: Secondary | ICD-10-CM

## 2018-07-06 MED ORDER — FLUCONAZOLE 150 MG PO TABS: 150.0000 mg | ORAL_TABLET | ORAL | 1 refills | Status: DC | PRN

## 2018-07-06 MED ORDER — METRONIDAZOLE 500 MG PO TABS: 500.0000 mg | ORAL_TABLET | Freq: Two times a day (BID) | ORAL | 0 refills | Status: DC

## 2018-07-06 NOTE — Telephone Encounter (Signed)
Called pt to reschedule appointment for [place] (afternoon apt)  and patient stated that she was having vaginal discharge, per [facility] sent Rx to pt pharmacy.

## 2018-07-13 ENCOUNTER — Telehealth: Payer: Self-pay | Admitting: Family Medicine

## 2018-07-13 NOTE — Telephone Encounter (Signed)
Call patient to offer options for her Friday appointment.  She is scheduled for a blood pressure check, has a working verified blood pressure cuff at home.  She is interested in doing a video appointment from home, will have this set up for Friday.  Allayne Stack, DO

## 2018-07-15 ENCOUNTER — Telehealth: Payer: Medicaid Other | Admitting: Family Medicine

## 2018-07-15 ENCOUNTER — Other Ambulatory Visit: Payer: Self-pay

## 2018-07-18 ENCOUNTER — Telehealth: Payer: Medicaid Other | Admitting: Family Medicine

## 2018-07-18 ENCOUNTER — Other Ambulatory Visit: Payer: Self-pay

## 2018-07-18 ENCOUNTER — Telehealth (INDEPENDENT_AMBULATORY_CARE_PROVIDER_SITE_OTHER): Payer: Medicaid Other | Admitting: Family Medicine

## 2018-07-18 DIAGNOSIS — I10 Essential (primary) hypertension: Secondary | ICD-10-CM | POA: Diagnosis not present

## 2018-07-18 MED ORDER — AMLODIPINE BESYLATE 10 MG PO TABS: 10.0000 mg | ORAL_TABLET | Freq: Every day | ORAL | 3 refills | Status: DC

## 2018-07-18 NOTE — Progress Notes (Signed)
[facility] Telemedicine Visit  Patient consented to have visit conducted via telephone.  Encounter participants: Patient: Julie Hendrix  Provider: Oralia Manis  Others (if applicable): None  Chief Complaint: BP check  HPI: Hypertension: Last seen by PCP on 06/17/18 at which time patient had elevated Bps. Patient was recommended for amlodipine 10mg  at that visit, but opted to stay at 5mg . Started 10mg  2 weeks ago. Last BP in clinic 144/88 in office 07/01/18.   - Medications: amlodipine 10mg  - Compliance: good,  - Checking BP at home: yes, 141/101 141/97.  - Denies any SOB, CP, vision changes, LE edema, medication SEs, or symptoms of hypotension - Diet: has improved diet and lost 12 lbs, eating better  - Exercise: daily, walking daily 5-6 laps around the track  ROS: See HPI, other ROS negative   Pertinent PMHx: HTN, pre-diabetes, obesity, PCOS  Assessment/Plan:  Essential hypertension Patient within goal per JNC 8 guidelines.  Advised that she continue to take amlodipine 10 mg as well as continue to incorporate lifestyle modification such as healthy eating and at daily exercise.  Patient is very determined not to be on medications.  Advised that patient should continue to check blood pressures at home as well.  Strict return precautions given.  Follow-up with PCP in 3 months.    Time spent on phone with patient: 15 minutes

## 2018-07-18 NOTE — Assessment & Plan Note (Signed)
Patient within goal per JNC 8 guidelines.  Advised that she continue to take amlodipine 10 mg as well as continue to incorporate lifestyle modification such as healthy eating and at daily exercise.  Patient is very determined not to be on medications.  Advised that patient should continue to check blood pressures at home as well.  Strict return precautions given.  Follow-up with PCP in 3 months.

## 2018-07-18 NOTE — Progress Notes (Signed)
I was preceptor the day of this visit.   

## 2018-08-01 ENCOUNTER — Ambulatory Visit: Payer: Medicaid Other | Admitting: Obstetrics & Gynecology

## 2018-09-15 ENCOUNTER — Ambulatory Visit: Payer: Medicaid Other | Admitting: Family Medicine

## 2018-09-15 ENCOUNTER — Other Ambulatory Visit: Payer: Self-pay

## 2018-09-15 VITALS — BP 138/78 | HR 96

## 2018-09-15 DIAGNOSIS — M7989 Other specified soft tissue disorders: Secondary | ICD-10-CM | POA: Diagnosis not present

## 2018-09-15 DIAGNOSIS — I10 Essential (primary) hypertension: Secondary | ICD-10-CM | POA: Diagnosis not present

## 2018-09-15 NOTE — Progress Notes (Signed)
Subjective:  Patient ID: Julie Hendrix  DOB: March 19, 1986 MRN: [id number]  Julie Hendrix is a 33 y.o. female with a PMH of hypertension, PCOS, AEB, prediabetes, here today for leg swelling.   HPI:  Bilateral leg swelling  HTN: -Patient reports that on Tuesday she noticed that at the end of the day her ankles were swollen bilaterally.  She states that maybe her right was a little more swollen than her left but both were swollen.  She made an appointment at that time because she was concerned.  She was also concerned because her blood pressure had been running slightly high at times at home. -She states that she has been attempting to lose weight and lost 15 pounds however in the past month she has started eating a lot of fast food and has gained 10 pounds back. -She reports that the day she noticed that her ankles were swelling she was working more than normal.  States that when she went home and props her feet up that the swelling improved.  She states that today she believes her legs are back to her normal size. -Patient denies any shortness of breath, chest pain, recent illness; he denies any change in vision -Patient reports that she has had some headache and when she is bending over to tie her shoes and sits up quickly she has had some slight lightheadedness that goes away within a couple of seconds.  Patient reports that she has also been drinking less than normal. -She reports that her most recent blood pressure at home was normal although she cannot recall the numbers at this time. -She states that her Norvasc was recently increased to 10 mg  ROS: All other systems otherwise negative, except as mentioned in HPI  Smoking status reviewed  Patient Active Problem List   Diagnosis Date Noted  . Leg swelling 09/16/2018  . Prediabetes 06/17/2018  . Mumps without complication 05/18/2018  . Essential hypertension 05/18/2018  . BMI 45.0-49.9, adult (HCC) 11/30/2017  .  PCOS (polycystic ovarian syndrome) 11/30/2017  . Low grade squamous intraepithelial lesion on cytologic smear of cervix (LGSIL) 05/21/2017  . 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 Breast lump on right side at 10 o'clock position 01/31/2013  . Abnormal uterine bleeding 09/08/2012     Objective:  BP 138/78   Pulse 96   SpO2 96%   Vitals and nursing note reviewed  General: NAD, pleasant Cardiac: RRR, normal heart sounds, no m/r/g Pulm: normal effort, CTAB Extremities: no edema or cyanosis. WWP.  No erythema or calf tenderness bilaterally.  Pedal pulses noted bilaterally. Skin: warm and dry, no rashes noted Neuro: alert and oriented, no focal deficits Psych: normal affect, normal thought content  Assessment & Plan:   Leg swelling Patient with now resolved leg swelling.  No concern for DVT given that swelling was bilateral with no associated tenderness or erythema.  Patient with no history of heart failure.  Could be related to patient's Norvasc given that she has a recent increase in medication.  Also be related to patient's recent increase in sodium intake and working on her feet all day.  Her leg swelling did resolve with elevation.   -Patient counseled on low-sodium diet, adequate hydration with water, wearing compression stockings and elevating her feet after her long days at work. -BP slightly elevated today at 138/78, and patient advised that she will need to come in for follow-up if her blood pressures do not continue to  improve at home. -Patient to come back in if she has a repeat of her leg swelling without resolution as she may require change in hypertensive therapy from amlodipine. -Return precautions discussed and patient voiced understanding.  Essential hypertension Patient within goal per JNC 8 guidelines at 138/78.  Patient encouraged to continue checking her blood pressure at home.  See below for leg swelling for possible need for change in therapy  if patient continues to have leg swelling or recent increase in Norvasc.  [place] Mozelle Remlinger, DO Family Medicine Resident PGY-2

## 2018-09-15 NOTE — Patient Instructions (Signed)
Thank you for coming to see me today. It was a pleasure! Today we talked about:   Please use compression stockings, prop your feet up when you have been on them all day and continue to avoid fast foods as they are higher in sodium. Your blood pressure is ok today, goal is <130/80. Please try to drink plenty of water.   Please follow-up as needed.  If you have any questions or concerns, please do not hesitate to call the office at [phone].  Take Care,   [place] Tondalaya Perren, DO  Edema  Edema is when you have too much fluid in your body or under your skin. Edema may make your legs, feet, and ankles swell up. Swelling is also common in looser tissues, like around your eyes. This is a common condition. It gets more common as you get older. There are many possible causes of edema. Eating too much salt (sodium) and being on your feet or sitting for a long time can cause edema in your legs, feet, and ankles. Hot weather may make edema worse. Edema is usually painless. Your skin may look swollen or shiny. Follow these instructions at home:  Keep the swollen body part raised (elevated) above the level of your heart when you are sitting or lying down.  Do not sit still or stand for a long time.  Do not wear tight clothes. Do not wear garters on your upper legs.  Exercise your legs. This can help the swelling go down.  Wear elastic bandages or support stockings as told by your doctor.  Eat a low-salt (low-sodium) diet to reduce fluid as told by your doctor.  Depending on the cause of your swelling, you may need to limit how much fluid you drink (fluid restriction).  Take over-the-counter and prescription medicines only as told by your doctor. Contact a doctor if:  Treatment is not working.  You have heart, liver, or kidney disease and have symptoms of edema.  You have sudden and unexplained weight gain. Get help right away if:  You have shortness of breath or chest pain.  You cannot  breathe when you lie down.  You have pain, redness, or warmth in the swollen areas.  You have heart, liver, or kidney disease and get edema all of a sudden.  You have a fever and your symptoms get worse all of a sudden. Summary  Edema is when you have too much fluid in your body or under your skin.  Edema may make your legs, feet, and ankles swell up. Swelling is also common in looser tissues, like around your eyes.  Raise (elevate) the swollen body part above the level of your heart when you are sitting or lying down.  Follow your doctor's instructions about diet and how much fluid you can drink (fluid restriction). This information is not intended to replace advice given to you by your health care provider. Make sure you discuss any questions you have with your health care provider. Document Released: 09/23/2007 Document Revised: 04/24/2016 Document Reviewed: 04/24/2016 Elsevier Interactive Patient Education  2019 [employer].

## 2018-09-16 DIAGNOSIS — M7989 Other specified soft tissue disorders: Secondary | ICD-10-CM | POA: Insufficient documentation

## 2018-09-16 NOTE — Assessment & Plan Note (Signed)
Patient within goal per JNC 8 guidelines at 138/78.  Patient encouraged to continue checking her blood pressure at home.  See below for leg swelling for possible need for change in therapy if patient continues to have leg swelling or recent increase in Norvasc.

## 2018-09-16 NOTE — Assessment & Plan Note (Addendum)
Patient with now resolved leg swelling.  No concern for DVT given that swelling was bilateral with no associated tenderness or erythema.  Patient with no history of heart failure.  Could be related to patient's Norvasc given that she has a recent increase in medication.  Also be related to patient's recent increase in sodium intake and working on her feet all day.  Her leg swelling did resolve with elevation.   -Patient counseled on low-sodium diet, adequate hydration with water, wearing compression stockings and elevating her feet after her long days at work. -BP slightly elevated today at 138/78, and patient advised that she will need to come in for follow-up if her blood pressures do not continue to improve at home. -Patient to come back in if she has a repeat of her leg swelling without resolution as she may require change in hypertensive therapy from amlodipine. -Return precautions discussed and patient voiced understanding.

## 2018-10-31 ENCOUNTER — Other Ambulatory Visit: Payer: Self-pay

## 2018-10-31 ENCOUNTER — Ambulatory Visit (INDEPENDENT_AMBULATORY_CARE_PROVIDER_SITE_OTHER): Payer: Medicaid Other | Admitting: *Deleted

## 2018-10-31 DIAGNOSIS — Z111 Encounter for screening for respiratory tuberculosis: Secondary | ICD-10-CM | POA: Diagnosis not present

## 2018-10-31 NOTE — Progress Notes (Signed)
Patient is here for a PPD placement.  PPD placed in left forearm.  Patient will return 11/02/18 to have PPD read. Christen Bame, CMA

## 2018-11-02 ENCOUNTER — Ambulatory Visit: Payer: Medicaid Other

## 2018-11-03 ENCOUNTER — Other Ambulatory Visit: Payer: Self-pay

## 2018-11-03 ENCOUNTER — Encounter: Payer: Self-pay | Admitting: Family Medicine

## 2018-11-03 ENCOUNTER — Ambulatory Visit (INDEPENDENT_AMBULATORY_CARE_PROVIDER_SITE_OTHER): Payer: Medicaid Other | Admitting: *Deleted

## 2018-11-03 DIAGNOSIS — Z111 Encounter for screening for respiratory tuberculosis: Secondary | ICD-10-CM

## 2018-11-03 LAB — TB SKIN TEST
Induration: 0 mm
TB Skin Test: NEGATIVE

## 2018-11-03 NOTE — Progress Notes (Signed)
Patient is here for a PPD read.  It was placed on 10/31/18 in the left forearm @ 2:34pm.    PPD RESULTS:  Result: Negative Induration: 0 mm  Letter created and given to patient for documentation purposes. Christen Bame, CMA

## 2018-11-30 ENCOUNTER — Telehealth: Payer: Self-pay | Admitting: *Deleted

## 2018-11-30 ENCOUNTER — Ambulatory Visit: Payer: Medicaid Other | Admitting: Nurse Practitioner

## 2018-11-30 NOTE — Telephone Encounter (Signed)
Pt is concerned because her new wrist BP machine told her that her BP was 159/102.  Upon speaking with pt realized that she check BP standing and with her arm hanging down.  Asked her to recheck with her arm across her chest, so that her cuff was heart level.  Also asked her to sit.  Once she rechecked BP it was 149/91.  Since pt is asymptomatic, advised of red flags to go to ED and appt made for 12/06/18. Christen Bame, CMA

## 2018-12-06 ENCOUNTER — Ambulatory Visit: Payer: Medicaid Other | Admitting: Family Medicine

## 2018-12-06 NOTE — Progress Notes (Deleted)
   Subjective:    Patient ID: Julie Hendrix, female    DOB: 03-17-1986, 33 y.o.   MRN: [id number]   CC: Hypertension follow-up  HPI: Julie Hendrix is a 33 year old female presenting discuss the following:  Hypertension:   Pap smear?  Smoking status reviewed  Review of Systems Per HPI, also denies recent illness, fever, headache, changes in vision, chest pain, shortness of breath, abdominal pain, N/V/D, weakness   Patient Active Problem List   Diagnosis Date Noted  . Leg swelling 09/16/2018  . Prediabetes 06/17/2018  . Mumps without complication 35/59/7416  . Essential hypertension 05/18/2018  . BMI 45.0-49.9, adult ([place]) 11/30/2017  . PCOS (polycystic ovarian syndrome) 11/30/2017  . Low grade squamous intraepithelial lesion on cytologic smear of cervix (LGSIL) 05/21/2017  . 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 Breast lump on right side at 10 o'clock position 01/31/2013  . Abnormal uterine bleeding 09/08/2012     Objective:  There were no vitals taken for this visit. Vitals and nursing note reviewed  General: NAD, pleasant Cardiac: RRR, normal heart sounds, no murmurs Respiratory: CTAB, normal effort Abdomen: soft, nontender, nondistended Extremities: no edema or cyanosis. WWP. Skin: warm and dry, no rashes noted Neuro: alert and oriented, no focal deficits Psych: normal affect  Assessment & Plan:    No problem-specific Assessment & Plan notes found for this encounter.    [facility] Resident PGY-2

## 2018-12-20 ENCOUNTER — Other Ambulatory Visit: Payer: Self-pay

## 2018-12-20 ENCOUNTER — Telehealth: Payer: Medicaid Other

## 2018-12-20 DIAGNOSIS — Z20828 Contact with and (suspected) exposure to other viral communicable diseases: Secondary | ICD-10-CM | POA: Diagnosis not present

## 2018-12-21 ENCOUNTER — Ambulatory Visit (HOSPITAL_COMMUNITY)
Admission: EM | Admit: 2018-12-21 | Discharge: 2018-12-21 | Disposition: A | Discharge status: Home / Self Care | Payer: Medicaid Other | Attending: Family Medicine | Admitting: Family Medicine

## 2018-12-21 ENCOUNTER — Encounter (HOSPITAL_COMMUNITY): Payer: Self-pay

## 2018-12-21 ENCOUNTER — Other Ambulatory Visit: Payer: Self-pay

## 2018-12-21 DIAGNOSIS — R109 Unspecified abdominal pain: Secondary | ICD-10-CM | POA: Insufficient documentation

## 2018-12-21 DIAGNOSIS — R1030 Lower abdominal pain, unspecified: Secondary | ICD-10-CM

## 2018-12-21 DIAGNOSIS — Z3202 Encounter for pregnancy test, result negative: Secondary | ICD-10-CM | POA: Diagnosis not present

## 2018-12-21 LAB — POCT PREGNANCY, URINE: Preg Test, Ur: NEGATIVE

## 2018-12-21 LAB — POCT URINALYSIS DIP (DEVICE)
Bilirubin Urine: NEGATIVE
Glucose, UA: NEGATIVE mg/dL
Ketones, ur: NEGATIVE mg/dL
Leukocytes,Ua: NEGATIVE
Nitrite: NEGATIVE
Protein, ur: NEGATIVE mg/dL
Specific Gravity, Urine: 1.025 (ref 1.005–1.030)
Urobilinogen, UA: 0.2 mg/dL (ref 0.0–1.0)
pH: 5.5 (ref 5.0–8.0)

## 2018-12-21 MED ORDER — AZITHROMYCIN 250 MG PO TABS
ORAL_TABLET | ORAL | Status: AC
  Filled 2018-12-21: qty 4

## 2018-12-21 MED ORDER — CEFTRIAXONE SODIUM 250 MG IJ SOLR
250.0000 mg | Freq: Once | INTRAMUSCULAR | Status: AC
  Administered 2018-12-21: 250 mg via INTRAMUSCULAR

## 2018-12-21 MED ORDER — LIDOCAINE HCL (PF) 1 % IJ SOLN
INTRAMUSCULAR | Status: AC
  Filled 2018-12-21: qty 2

## 2018-12-21 MED ORDER — AZITHROMYCIN 250 MG PO TABS
1000.0000 mg | ORAL_TABLET | Freq: Once | ORAL | Status: AC
  Administered 2018-12-21: 1000 mg via ORAL

## 2018-12-21 MED ORDER — CEFTRIAXONE SODIUM 250 MG IJ SOLR
INTRAMUSCULAR | Status: AC
  Filled 2018-12-21: qty 250

## 2018-12-21 NOTE — ED Triage Notes (Signed)
Patient presents to Urgent Care with complaints of sharp pains that started in her middle abdomen and has traveled around her bck since yesterday. Patient reports she thought her period was supposed to start and could be causing it but her period has not started, did have unprotected intercourse a few weeks ago.

## 2018-12-21 NOTE — Discharge Instructions (Addendum)
You have been seen today for abdominal pain. Your evaluation was not suggestive of any emergent condition requiring medical intervention at this time. However, some abdominal problems make take more time to appear. Therefore, it is very important for you to pay attention to any new symptoms or worsening of your current condition.  Please return here or to the Emergency Department immediately should you begin to feel worse in any way or have any of the following symptoms: increasing or different abdominal pain, persistent vomiting, inability to drink fluids, fevers, or shaking chills.   You have been given the following medications today for treatment of suspected gonorrhea and/or chlamydia:  cefTRIAXone (ROCEPHIN) injection 250 mg azithromycin (ZITHROMAX) tablet 1,000 mg  Even though we have treated you today, we have sent testing for sexually transmitted infections. We will notify you of any positive results once they are received. If required, we will prescribe any medications you might need.  Please refrain from all sexual activity for at least the next seven days.

## 2018-12-22 NOTE — ED Provider Notes (Signed)
[facility]   [id number] 12/21/18 Arrival Time: 1947  ASSESSMENT & PLAN:  1. Lower abdominal pain   2. Acute right flank pain     Benign abdominal exam. No indications for urgent abdominal/pelvic imaging at this time. Low suspicion for appendicitis. Discussed possibility of ovarian cyst. No evidence of pyelonephritis. Discussed. Will treat empirically for sexually transmitted infections. Discussed s/s of PID. UPT negative.   Discharge Instructions     You have been seen today for abdominal pain. Your evaluation was not suggestive of any emergent condition requiring medical intervention at this time. However, some abdominal problems make take more time to appear. Therefore, it is very important for you to pay attention to any new symptoms or worsening of your current condition.  Please return here or to the Emergency Department immediately should you begin to feel worse in any way or have any of the following symptoms: increasing or different abdominal pain, persistent vomiting, inability to drink fluids, fevers, or shaking chills.   You have been given the following medications today for treatment of suspected gonorrhea and/or chlamydia:  cefTRIAXone (ROCEPHIN) injection 250 mg azithromycin (ZITHROMAX) tablet 1,000 mg  Even though we have treated you today, we have sent testing for sexually transmitted infections. We will notify you of any positive results once they are received. If required, we will prescribe any medications you might need.  Please refrain from all sexual activity for at least the next seven days.    Follow-up Information    Allayne StackBeard, Samantha N, DO.   Specialty: Family Medicine Why: As needed. Contact information: [address] [place] [address] [phone]        [facility].   Specialty: Emergency Medicine Why: If symptoms worsen in any way. Contact information: [address]  [id number] [place] [id number] [phone]         PO intake as tolerated.  Reviewed expectations re: course of current medical issues. Questions answered. Outlined signs and symptoms indicating need for more acute intervention. Patient verbalized understanding. After Visit Summary given.   SUBJECTIVE: History from: patient. Julie Hendrix is a 33 y.o. female who presents with complaint of intermittent and mostly lower abdominal discomfort. Also occasional right flank pain. Onset gradual, first noticed yesterday. Discomfort described as aching and dull; without specific radiation to or from a location; current symptoms do not wake her at night. Symptoms are unchanged since beginning; wax and wane sporadically. No specific association with PO intake. Fever: absent. Aggravating factors: have not been identified. Alleviating factors: have not been identified. Associated symptoms: mild fatigue. She denies arthralgias, chills, constipation, diarrhea, dysuria, headache, hematuria, melena, myalgias and sweats. Appetite: normal. PO intake: normal. Ambulatory without assistance. Denies: urinary frequency and dysuria. Bowel movements: have not significantly changed; last bowel movement within the past 1-2 days and without blood. History of similar: no. OTC treatment: none reported. No new medciations.  No LMP recorded. (Menstrual status: Irregular Periods).   Does reports having sexual intercourse with new female partner a couple of weeks ago. Questions relation to current symptoms. No vaginal discharge reported.  Past Surgical History:  Procedure Laterality Date  . COLPOSCOPY    . CRYOTHERAPY  06/08/2017   cervix   ROS: As per HPI. All other systems negative.  OBJECTIVE:  Vitals:   12/21/18 2041  BP: 136/82  Pulse: 87  Resp: 15  Temp: 97.9 F (36.6 C)  TempSrc: Oral  SpO2: 100%  General appearance: alert, oriented, no acute distress; moves around room  and climbs onto exam table without difficulty Oropharynx: moist Lungs: clear to auscultation bilaterally; unlabored respirations Heart: regular rate and rhythm Abdomen: soft; without distention; mild and poorly localized tenderness to palpation over right lower abdomen/inguinal area; normal bowel sounds; without masses or organomegaly; without guarding or rebound tenderness GU: declines Back: without CVA tenderness; FROM at waist Extremities: without LE edema; symmetrical; without gross deformities Skin: warm and dry Neurologic: normal gait Psychological: alert and cooperative; normal mood and affect  Labs: Results for orders placed or performed during the hospital encounter of 12/21/18  POCT urinalysis dip (device)  Result Value Ref Range   Glucose, UA NEGATIVE NEGATIVE mg/dL   Bilirubin Urine NEGATIVE NEGATIVE   Ketones, ur NEGATIVE NEGATIVE mg/dL   Specific Gravity, Urine 1.025 1.005 - 1.030   Hgb urine dipstick TRACE (A) NEGATIVE   pH 5.5 5.0 - 8.0   Protein, ur NEGATIVE NEGATIVE mg/dL   Urobilinogen, UA 0.2 0.0 - 1.0 mg/dL   Nitrite NEGATIVE NEGATIVE   Leukocytes,Ua NEGATIVE NEGATIVE  Pregnancy, urine POC  Result Value Ref Range   Preg Test, Ur NEGATIVE NEGATIVE   Labs Reviewed  POCT URINALYSIS DIP (DEVICE) - Abnormal; Notable for the following components:      Result Value   Hgb urine dipstick TRACE (*)    All other components within normal limits  POC URINE PREG, ED  POCT PREGNANCY, URINE  CERVICOVAGINAL ANCILLARY ONLY    No Known Allergies                                             Past Medical History:  Diagnosis Date  . Abnormal Pap smear   . 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 Dysplasia of cervix, low grade (CIN 1)   . HPV (human papilloma virus) anogenital infection    Social History   Socioeconomic History  . Marital status: Single    Spouse name: Not on file  . Number of children: Not on file  .  Years of education: Not on file  . Highest education level: Not on file  Occupational History  . Not on file  Social Needs  . Financial resource strain: Not on file  . Food insecurity    Worry: Not on file    Inability: Not on file  . Transportation needs    Medical: Not on file    Non-medical: Not on file  Tobacco Use  . Smoking status: Former Smoker    Packs/day: 0.25    Types: Cigarettes    Quit date: 11/22/2017    Years since quitting: 1.0  . Smokeless tobacco: Never Used  . Tobacco comment: quit Aug 2019  Substance and Sexual Activity  . Alcohol use: Yes    Alcohol/week: 6.0 standard drinks    Types: 2 Glasses of wine, 2 Cans of beer, 2 Shots of liquor per week    Comment: socially, more since her friend was killed  . Drug use: No  . Sexual activity: Yes    Birth control/protection: None  Lifestyle  . Physical activity    Days per week: 0 days    Minutes per session: 0 min  . Stress: Not at all  Relationships  . Social connections    Talks on phone: More than three times  a week    Gets together: Once a week    Attends religious service: More than 4 times per year    Active member of club or organization: No    Attends meetings of clubs or organizations: Never    Relationship status: Never married  . Intimate partner violence    Fear of current or ex partner: No    Emotionally abused: No    Physically abused: No    Forced sexual activity: No  Other Topics Concern  . Not on file  Social History Narrative  . Not on file   Family History  Problem Relation Age of Onset  . Hypertension Father   . Diabetes Father   . Heart attack Father   . Hypertension Mother      Mardella Layman, MD 12/22/18 1149

## 2019-01-03 LAB — CERVICOVAGINAL ANCILLARY ONLY
Bacterial vaginitis: POSITIVE — AB
Candida vaginitis: POSITIVE — AB
Chlamydia: NEGATIVE
Neisseria Gonorrhea: NEGATIVE
Trichomonas: NEGATIVE

## 2019-01-04 ENCOUNTER — Telehealth (HOSPITAL_COMMUNITY): Payer: Self-pay | Admitting: Emergency Medicine

## 2019-01-04 MED ORDER — METRONIDAZOLE 500 MG PO TABS: 500.0000 mg | ORAL_TABLET | Freq: Two times a day (BID) | ORAL | 0 refills | Status: AC

## 2019-01-04 MED ORDER — FLUCONAZOLE 150 MG PO TABS: 150.0000 mg | ORAL_TABLET | Freq: Once | ORAL | 0 refills | Status: AC

## 2019-01-04 NOTE — Telephone Encounter (Signed)
Bacterial vaginosis is positive. This was not treated at the urgent care visit.  Flagyl 500 mg BID x 7 days #14 no refills sent to patients pharmacy of choice.    Test for candida (yeast) was positive.  Prescription for fluconazole 150mg po now, repeat dose in 3d if needed, #2 no refills, sent to the pharmacy of record.  Recheck or followup with PCP for further evaluation if symptoms are not improving.    Patient contacted and made aware of    results, all questions answered   

## 2019-02-07 ENCOUNTER — Telehealth: Payer: Self-pay | Admitting: *Deleted

## 2019-02-07 NOTE — Telephone Encounter (Signed)
Pt calls because her BP today was 140/94 and 138/83.  She has no other symptoms other than a headache which she thinks is because of allergies and the weather change.   Advised that these BPs are not urgently worrisome but that she does need an appt with PCP to f/u on her BP and maybe make medication changes.  Pt agreeable to virtual appt 02/17/19.  She will check her BPs once a day and write them down so that they can be discussed at visit.  Red flags given to go to ED.  Christen Bame, CMA

## 2019-02-08 NOTE — Telephone Encounter (Signed)
Thank you Janett Billow! I look forward to seeing her next week.   Best, Dr Higinio Plan

## 2019-02-17 ENCOUNTER — Telehealth (INDEPENDENT_AMBULATORY_CARE_PROVIDER_SITE_OTHER): Payer: Medicaid Other | Admitting: Family Medicine

## 2019-02-17 ENCOUNTER — Other Ambulatory Visit: Payer: Self-pay | Admitting: Family Medicine

## 2019-02-17 ENCOUNTER — Other Ambulatory Visit: Payer: Self-pay

## 2019-02-17 DIAGNOSIS — R519 Headache, unspecified: Secondary | ICD-10-CM | POA: Insufficient documentation

## 2019-02-17 DIAGNOSIS — F17200 Nicotine dependence, unspecified, uncomplicated: Secondary | ICD-10-CM

## 2019-02-17 DIAGNOSIS — G4489 Other headache syndrome: Secondary | ICD-10-CM

## 2019-02-17 DIAGNOSIS — R7303 Prediabetes: Secondary | ICD-10-CM

## 2019-02-17 DIAGNOSIS — I10 Essential (primary) hypertension: Secondary | ICD-10-CM

## 2019-02-17 DIAGNOSIS — T7840XA Allergy, unspecified, initial encounter: Secondary | ICD-10-CM

## 2019-02-17 DIAGNOSIS — F4321 Adjustment disorder with depressed mood: Secondary | ICD-10-CM

## 2019-02-17 MED ORDER — FLUTICASONE PROPIONATE 50 MCG/ACT NA SUSP: 1.0000 | Freq: Every day | NASAL | 0 refills | Status: DC

## 2019-02-17 NOTE — Progress Notes (Signed)
[facility] Telemedicine Visit  Patient consented to have virtual visit. Method of visit: Telephone  Encounter participants: Patient: Julie Hendrix - located at home Provider: Allayne Stack - located at [facility] Others (if applicable): None  Chief Complaint: Headache  HPI: Julie Hendrix is a 33 year old female presenting to discuss the following:  Headache: Now resolved, however several days ago she had had a headache that came back for a few days in a row.  She is also been noticing more headaches than usual for the past month or so when she wakes up.  Behind her bilateral eyes and frontal area, wonders if maybe its allergies.  Will last several hours.  Did notice an improvement in her symptoms with OTC Zyrtec.  Pressure-like feeling.  Denies any associated phonophobia, photophobia, nausea, vomiting, visual changes.  She had called to schedule appointment because she was worried that maybe it is related to her blood pressure.  The last few days when she checked her blood pressure is 142/94, 137/83, 134/85.  Endorses snoring and daytime fatigue/tiredness.  A lot of times feels like she did not get a good night sleep.  Does not drink coffee, sodas on occasion.  Has not used Tylenol or ibuprofen to help, will go away on its own.  Can continue to do activities.  Of note, her best friend passed away 2 months ago.  She started smoking 2 black and milds daily because of the stress.  She feels like now she has been handling it pretty well, has great family and friends support.  Not interested in any therapy.  Denies any feelings of depression, anxiety, SI or HI.  ROS: per HPI  Pertinent PMHx: Elevated BMI, hypertension, PCOS, prediabetes  Exam:  Respiratory: Speaking full sentences, unlabored breathing  Assessment/Plan:  Headache Intermittent and more frequent over the past month, not currently with a headache.  Differential remains broad and may be multifactorial,  however ultimately suspect in the setting of allergies given distribution and improvement with Zyrtec.  Additionally as she endorses morning headaches with daytime fatigue/snoring, could consider OSA. While it is less likely OSA to be causing her headaches, this should be ruled out regardless due to symptoms and may be contributing to hypertension.  Low concern for migraines given distribution and quality of HA.  Considered secondary to hypertension, however reassuringly BP has been reasonably controlled during this time. -Trial Flonase -Referral to sleep studies -Tylenol/ibuprofen as needed -Follow-up if headaches continue to be frequent or worsening of severity  Essential hypertension Reasonable control with SBP normally ranging around 130-140 and diastolics in 80-90, however in this young adult, would like to strive for the goal of <130/80 on a consistent basis.  Discussed trialing a strong effort of lifestyle changes including increasing physical activity, quitting smoking, and following mindful dietary habits for the next 3 months prior to adding on additional medication to her Norvasc 10 mg.  She is motivated to try this first. -Continue amlodipine 10 mg -Monitor BP periodically at home, bring journal in on follow-up -Sleep study to rule out OSA given symptomatology -Consider addition of second antihypertensive medication on follow-up if not controlled, considerations for chlorthalidone -Follow-up in 3 months or sooner if needed  Prediabetes Patient to be working towards lifestyle modifications currently.  Will recheck A1c on follow-up in 3 months.  Grief reaction Reaction appears appropriate, her best friend recently passed away 2 months ago.  She feels she is handling this well, however started smoking black &  milds one month ago due to the stress.  Endorses good family and friend support.  Not interested in any additional therapy resources at this time and not expressing any depressive  symptoms.  Provided supportive listening.    Time spent during visit with patient: 13 minutes  Patriciaann Clan, DO

## 2019-02-17 NOTE — Assessment & Plan Note (Addendum)
Intermittent and more frequent over the past month, not currently with a headache.  Differential remains broad and may be multifactorial, however ultimately suspect in the setting of allergies given distribution and improvement with Zyrtec.  Additionally as she endorses morning headaches with daytime fatigue/snoring, could consider OSA. While it is less likely OSA to be causing her headaches, this should be ruled out regardless due to symptoms and may be contributing to hypertension.  Low concern for migraines given distribution and quality of HA.  Considered secondary to hypertension, however reassuringly BP has been reasonably controlled during this time. -Trial Flonase -Referral to sleep studies -Tylenol/ibuprofen as needed -Follow-up if headaches continue to be frequent or worsening of severity

## 2019-02-18 DIAGNOSIS — F4320 Adjustment disorder, unspecified: Secondary | ICD-10-CM | POA: Insufficient documentation

## 2019-02-18 DIAGNOSIS — F17200 Nicotine dependence, unspecified, uncomplicated: Secondary | ICD-10-CM

## 2019-02-18 DIAGNOSIS — F4321 Adjustment disorder with depressed mood: Secondary | ICD-10-CM | POA: Insufficient documentation

## 2019-02-18 HISTORY — DX: Nicotine dependence, unspecified, uncomplicated: F17.200

## 2019-02-18 NOTE — Assessment & Plan Note (Addendum)
Reasonable control with SBP normally ranging around 001-749 and diastolics in 44-96, however in this young adult, would like to strive for the goal of <130/80 on a consistent basis.  Discussed trialing a strong effort of lifestyle changes including increasing physical activity, quitting smoking, and following mindful dietary habits for the next 3 months prior to adding on additional medication to her Norvasc 10 mg.  She is motivated to try this first. -Continue amlodipine 10 mg -Monitor BP periodically at home, bring journal in on follow-up -Sleep study to rule out OSA given symptomatology -Consider addition of second antihypertensive medication on follow-up if not controlled, considerations for chlorthalidone -Follow-up in 3 months or sooner if needed

## 2019-02-18 NOTE — Assessment & Plan Note (Signed)
Reaction appears appropriate, her best friend recently passed away 2 months ago.  She feels she is handling this well, however started smoking black & milds one month ago due to the stress.  Endorses good family and friend support.  Not interested in any additional therapy resources at this time and not expressing any depressive symptoms.  Provided supportive listening.

## 2019-02-18 NOTE — Assessment & Plan Note (Signed)
Patient to be working towards lifestyle modifications currently.  Will recheck A1c on follow-up in 3 months.

## 2019-04-28 ENCOUNTER — Other Ambulatory Visit: Payer: Self-pay

## 2019-04-28 DIAGNOSIS — I10 Essential (primary) hypertension: Secondary | ICD-10-CM

## 2019-05-01 MED ORDER — AMLODIPINE BESYLATE 10 MG PO TABS: 10.0000 mg | ORAL_TABLET | Freq: Every day | ORAL | 0 refills | Status: DC

## 2019-05-04 ENCOUNTER — Ambulatory Visit (INDEPENDENT_AMBULATORY_CARE_PROVIDER_SITE_OTHER): Payer: Medicaid Other

## 2019-05-04 ENCOUNTER — Other Ambulatory Visit (HOSPITAL_COMMUNITY)
Admission: RE | Admit: 2019-05-04 | Discharge: 2019-05-04 | Disposition: A | Discharge status: Home / Self Care | Payer: Medicaid Other | Source: Ambulatory Visit | Attending: Obstetrics and Gynecology | Admitting: Obstetrics and Gynecology

## 2019-05-04 ENCOUNTER — Other Ambulatory Visit: Payer: Self-pay

## 2019-05-04 DIAGNOSIS — Z9189 Other specified personal risk factors, not elsewhere classified: Secondary | ICD-10-CM | POA: Insufficient documentation

## 2019-05-04 NOTE — Progress Notes (Signed)
Pt here today for self-swab. Pt reports unprotected intercourse on 04/29/19 and requests a self-swab. Denies any vaginal symptoms. Endorses some abdominal pain.   Self-swab instructions given and specimen obtained. Explained to pt we will call with abnormal results only. Pt reports hx of abnormal PAP. Discussed importance of following up after an abnormal PAP smear. Pt states she scheduled appt  for PAP smear on 06/20/19 with front office prior to appt today. Pt requested information about birth control; handouts given to pt to consider prior to upcoming appt. Pt encouraged to follow-up with our office as needed.   Fleet Contras RN 05/04/19

## 2019-05-05 LAB — CERVICOVAGINAL ANCILLARY ONLY
Bacterial Vaginitis (gardnerella): POSITIVE — AB
Candida Glabrata: NEGATIVE
Candida Vaginitis: NEGATIVE
Chlamydia: NEGATIVE
Comment: NEGATIVE
Comment: NEGATIVE
Comment: NEGATIVE
Comment: NEGATIVE
Comment: NEGATIVE
Comment: NORMAL
Neisseria Gonorrhea: NEGATIVE
Trichomonas: NEGATIVE

## 2019-05-11 ENCOUNTER — Other Ambulatory Visit: Payer: Self-pay | Admitting: Obstetrics & Gynecology

## 2019-05-11 DIAGNOSIS — Z6841 Body Mass Index (BMI) 40.0 and over, adult: Secondary | ICD-10-CM

## 2019-05-11 MED ORDER — METRONIDAZOLE 500 MG PO TABS: 500.0000 mg | ORAL_TABLET | Freq: Two times a day (BID) | ORAL | 0 refills | Status: AC

## 2019-05-19 NOTE — Progress Notes (Signed)
Patient ID: Julie Hendrix, female   DOB: 09-04-1985, 34 y.o.   MRN: [id number] Patient seen and assessed by nursing staff during this encounter. I have reviewed the chart and agree with the documentation and plan.  Scheryl Darter, MD 05/19/2019 2:38 PM

## 2019-05-29 ENCOUNTER — Ambulatory Visit (HOSPITAL_COMMUNITY)
Admission: EM | Admit: 2019-05-29 | Discharge: 2019-05-29 | Disposition: A | Discharge status: Home / Self Care | Payer: Medicaid Other | Attending: Family Medicine | Admitting: Family Medicine

## 2019-05-29 ENCOUNTER — Other Ambulatory Visit: Payer: Self-pay

## 2019-05-29 ENCOUNTER — Encounter (HOSPITAL_COMMUNITY): Payer: Self-pay

## 2019-05-29 DIAGNOSIS — R0789 Other chest pain: Secondary | ICD-10-CM

## 2019-05-29 NOTE — ED Provider Notes (Signed)
[facility]    CSN: [id number] Arrival date & time: 05/29/19  1629      History   Chief Complaint Chief Complaint  Patient presents with  . Chest Pain    HPI [facility] Julie Hendrix is a 34 y.o. female.   Patient with history of hypertension reports to urgent care today for left sided sharp chest pain. She currently denies chest pain or army tingling, however reports this history. She notes sharp chest pain to the left of her sternum most of yesterday, as well as sharp pain under her left breast. The pain subsided last night but again returned today at 1PM. She denies shortness of breath with the pain and deep breathing does not change the pain. However, she noticed some left arm tingling today as well. She can not determine if this started with the chest pain today or not. She notes tingling on the back of her arm and a "weird" feeling. She does sleep on her stomach and arms above head and wonders if this could contribute to her issue. She denies nausea or sweating with any of the above complaints.    She reports medial calf pain on both calves as well. She denies abnormal swelling but thought she felt a knot on inside of the right calf.   Her health history is significant for hypertension. She does not use birth control. She is a smoker.   She reports a primary care appointment tomorrow.     Past Medical History:  Diagnosis Date  . Abnormal Pap smear   . 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 Dysplasia of cervix, low grade (CIN 1)   . HPV (human papilloma virus) anogenital infection     Patient Active Problem List   Diagnosis Date Noted  . Grief reaction 02/18/2019  . Smoker 02/18/2019  . Headache 02/17/2019  . Leg swelling 09/16/2018  . Prediabetes 06/17/2018  . Mumps without complication 05/18/2018  . Essential hypertension 05/18/2018  . BMI 45.0-49.9, adult (HCC) 11/30/2017  . PCOS (polycystic ovarian  syndrome) 11/30/2017  . Low grade squamous intraepithelial lesion on cytologic smear of cervix (LGSIL) 05/21/2017  . 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 Breast lump on right side at 10 o'clock position 01/31/2013  . Abnormal uterine bleeding 09/08/2012    Past Surgical History:  Procedure Laterality Date  . COLPOSCOPY    . CRYOTHERAPY  06/08/2017   cervix    OB History    Gravida  3   Para  2   Term  2   Preterm  0   AB  1   Living  2     SAB  0   TAB  1   Ectopic  0   Multiple  0   Live Births  2            Home Medications    Prior to Admission medications   Medication Sig Start Date End Date Taking? Authorizing Provider  amLODipine (NORVASC) 10 MG tablet Take 1 tablet (10 mg total) by mouth daily. F/u for hypertension. 05/01/19  Yes Beard, Samantha N, DO  fluticasone (FLONASE) 50 MCG/ACT nasal spray Place 1 spray into both nostrils daily. 1 spray in each nostril every day 02/17/19   Allayne Stack, DO    Family History Family History  Problem Relation Age of Onset  . Hypertension Father   . Diabetes Father   . Heart attack Father   .  Hypertension Mother     Social History Social History   Tobacco Use  . Smoking status: Current Every Day Smoker    Packs/day: 0.25    Types: Cigarettes, Cigars    Last attempt to quit: 11/22/2017    Years since quitting: 1.5  . Smokeless tobacco: Never Used  . Tobacco comment: quit Aug 2019  Substance Use Topics  . Alcohol use: Yes    Alcohol/week: 6.0 standard drinks    Types: 2 Glasses of wine, 2 Cans of beer, 2 Shots of liquor per week    Comment: socially, more since her friend was killed  . Drug use: No     Allergies   Patient has no known allergies.   Review of Systems Review of Systems  Constitutional: Negative for chills, fatigue and fever.  HENT: Negative.   Eyes: Negative.   Respiratory: Negative for cough, chest tightness, shortness of breath and wheezing.     Cardiovascular: Positive for chest pain. Negative for palpitations and leg swelling.  Gastrointestinal: Negative for abdominal pain, nausea and vomiting.  Genitourinary: Negative.   Musculoskeletal: Negative for arthralgias, back pain, joint swelling and myalgias.  Skin: Negative for color change and rash.  Neurological: Negative for seizures, syncope and headaches.  All other systems reviewed and are negative.    Physical Exam Triage Vital Signs ED Triage Vitals  Enc Vitals Group     BP 05/29/19 1642 (!) 145/97     Pulse Rate 05/29/19 1642 81     Resp 05/29/19 1642 16     Temp 05/29/19 1642 98.1 F (36.7 C)     Temp Source 05/29/19 1642 Oral     SpO2 05/29/19 1642 100 %     Weight --      Height --      Head Circumference --      Peak Flow --      Pain Score 05/29/19 1640 6     Pain Loc --      Pain Edu? --      Excl. in GC? --    No data found.  Updated Vital Signs BP (!) 145/97 (BP Location: Right Arm)   Pulse 81   Temp 98.1 F (36.7 C) (Oral)   Resp 16   SpO2 100%   Visual Acuity Right Eye Distance:   Left Eye Distance:   Bilateral Distance:    Right Eye Near:   Left Eye Near:    Bilateral Near:     Physical Exam Vitals and nursing note reviewed.  Constitutional:      General: She is not in acute distress.    Appearance: She is well-developed. She is not ill-appearing.  HENT:     Head: Normocephalic and atraumatic.  Eyes:     Extraocular Movements: Extraocular movements intact.     Conjunctiva/sclera: Conjunctivae normal.     Pupils: Pupils are equal, round, and reactive to light.  Cardiovascular:     Rate and Rhythm: Normal rate and regular rhythm.     Heart sounds: No murmur.  Pulmonary:     Effort: Pulmonary effort is normal. No respiratory distress.     Breath sounds: Normal breath sounds. No decreased breath sounds, wheezing, rhonchi or rales.  Musculoskeletal:     Cervical back: Normal range of motion and neck supple.     Right lower  leg: Tenderness (medial aspect of calf) present.     Left lower leg: Tenderness (medial aspect of calf) present.  Skin:  General: Skin is warm and dry.  Neurological:     General: No focal deficit present.     Mental Status: She is alert and oriented to person, place, and time.     Comments: 5/5 strength and sensation intact in UE bilaterally  Psychiatric:        Mood and Affect: Mood normal.        Behavior: Behavior normal.      UC Treatments / Results  Labs (all labs ordered are listed, but only abnormal results are displayed) Labs Reviewed - No data to display  EKG NSR with right axis deviation. No major change from previous in 2019  Radiology No results found.  Procedures Procedures (including critical care time)  Medications Ordered in UC Medications - No data to display  Initial Impression / Assessment and Plan / UC Course  I have reviewed the triage vital signs and the nursing notes.  Pertinent labs & imaging results that were available during my care of the patient were reviewed by me and considered in my medical decision making (see chart for details).     #Atypical Chest pain ECG and history reassuring for non-cardiac origin. Given no dyspnea/hypoxia, tachycardia and minimal risk factors Hendrix is also less likely. Likely muscular vs costchondritis. Has primary care follow up tomorrow -unclear etiology of arm sensation. No evidence of neuropathy today - instructed to follow up with PCP for continued monitoring , ED precautions given.  Final Clinical Impressions(s) / UC Diagnoses   Final diagnoses:  Atypical chest pain     Discharge Instructions     We do not believe this is related to your heart  Please follow up with your Primary care tomorrow as scheduled  Go directly to the Emergency Department or call 911 if you have severe chest pain, shortness of breath,  or feel as though you might pass out, have lots of swelling in just one leg       ED  Prescriptions    None     PDMP not reviewed this encounter.   Purnell Shoemaker, PA-C 05/30/19 [phone]

## 2019-05-29 NOTE — ED Triage Notes (Signed)
Patient presents to Urgent Care with complaints of chest pain since this morning. Patient reports her family doctor sent her here. Pt states her CP started while sitting at work and her left arm started hurting too, pt had left calf pain yesterday, pain is intermittent.

## 2019-05-29 NOTE — Discharge Instructions (Addendum)
We do not believe this is related to your heart  Please follow up with your Primary care tomorrow as scheduled  Go directly to the Emergency Department or call 911 if you have severe chest pain, shortness of breath,  or feel as though you might pass out, have lots of swelling in just one leg

## 2019-05-30 ENCOUNTER — Ambulatory Visit: Payer: Medicaid Other

## 2019-06-06 ENCOUNTER — Other Ambulatory Visit: Payer: Self-pay

## 2019-06-06 ENCOUNTER — Telehealth: Payer: Self-pay

## 2019-06-06 ENCOUNTER — Other Ambulatory Visit: Payer: Self-pay | Admitting: Family Medicine

## 2019-06-06 ENCOUNTER — Ambulatory Visit (INDEPENDENT_AMBULATORY_CARE_PROVIDER_SITE_OTHER): Payer: Medicaid Other | Admitting: Family Medicine

## 2019-06-06 VITALS — BP 134/78 | HR 82 | Wt 265.4 lb

## 2019-06-06 DIAGNOSIS — E282 Polycystic ovarian syndrome: Secondary | ICD-10-CM | POA: Diagnosis not present

## 2019-06-06 DIAGNOSIS — K219 Gastro-esophageal reflux disease without esophagitis: Secondary | ICD-10-CM | POA: Diagnosis not present

## 2019-06-06 DIAGNOSIS — N926 Irregular menstruation, unspecified: Secondary | ICD-10-CM | POA: Diagnosis not present

## 2019-06-06 LAB — POCT URINE PREGNANCY: Preg Test, Ur: NEGATIVE

## 2019-06-06 MED ORDER — OMEPRAZOLE 20 MG PO CPDR: 20.0000 mg | DELAYED_RELEASE_CAPSULE | Freq: Every day | ORAL | 0 refills | Status: DC

## 2019-06-06 NOTE — Assessment & Plan Note (Signed)
Missed period by ~1 week, History of PCOS - Upreg negative.

## 2019-06-06 NOTE — Patient Instructions (Signed)

## 2019-06-06 NOTE — Assessment & Plan Note (Addendum)
Chest pain seems to be related to GERD.  Low risk for cardiac issues. -Trial PPI -Recommend weight loss and cessation of smoking, alcohol, caffeine or carbonated beverages, spicy foods, chocolate and review of symptoms -Follow-up in 1 month if no improvement

## 2019-06-06 NOTE — Telephone Encounter (Signed)
Patient calls nurse line regarding follow up from ER on 05/29/2019. Patient reports that she has been continuing to have sharp chest pains since visit. Patient reports that pain sometimes radiates to behind shoulder and back. Patient states last episode was last night. No active chest pain during conversation with patient. Patient reports no difficulty breathing, shortness of breath and does not appear to be in any distress. Pt declines fever or any other flu-like symptoms.   Scheduled access to care for this afternoon.   ED precautions given  To PCP  Veronda Prude, RN

## 2019-06-06 NOTE — Progress Notes (Signed)
   CHIEF COMPLAINT / HPI:  Patient presents for 1 week of chest pain.  Previously seen at urgent care where she had normal EKG.  Chest pain was improved with Tums and burping.  Has had mild nausea, no vomiting.  Patient does smoke, drink alcohol, drinks caffeinated and carbonated beverages.  These make things worse.  Is not associated with exertion, shortness of breath.  Not worsening.  No family history of sudden cardiac death before the age of 32.  Missed Period Patient missed her period.  Endorses unprotected sex over the past month.  Patient is late by about 1 week.  History of normal menses.  PERTINENT  PMH / PSH:  Elevated BMI Tobacco use   OBJECTIVE: BP 134/78   Pulse 82   Wt 265 lb 6.4 oz (120.4 kg)   LMP 05/05/2019   SpO2 98%   BMI 47.01 kg/m   Gen: NAD, resting comfortably CV: RRR with no murmurs appreciated, no chest wall tenderness Pulm: NWOB, CTAB with no crackles, wheezes, or rhonchi GI: epigastric tenderness Soft, Nontender, Nondistended. MSK: no edema, cyanosis, or clubbing noted Skin: warm, dry Neuro: grossly normal, moves all extremities Psych: Normal affect and thought content   ASSESSMENT / PLAN:  Gastroesophageal reflux disease Chest pain seems to be related to GERD.  Low risk for cardiac issues. -Trial PPI -Recommend weight loss and cessation of smoking, alcohol, caffeine or carbonated beverages, spicy foods, chocolate and review of symptoms -Follow-up in 1 month if no improvement  PCOS (polycystic ovarian syndrome) Missed period by ~1 week, History of PCOS - Upreg negative.      Garnette Gunner, MD [facility]

## 2019-06-20 ENCOUNTER — Encounter: Payer: Self-pay | Admitting: Nurse Practitioner

## 2019-06-20 ENCOUNTER — Other Ambulatory Visit: Payer: Self-pay

## 2019-06-20 ENCOUNTER — Other Ambulatory Visit (HOSPITAL_COMMUNITY)
Admission: RE | Admit: 2019-06-20 | Discharge: 2019-06-20 | Disposition: A | Discharge status: Home / Self Care | Payer: Medicaid Other | Source: Ambulatory Visit | Attending: Nurse Practitioner | Admitting: Nurse Practitioner

## 2019-06-20 ENCOUNTER — Ambulatory Visit (INDEPENDENT_AMBULATORY_CARE_PROVIDER_SITE_OTHER): Payer: Medicaid Other | Admitting: Nurse Practitioner

## 2019-06-20 VITALS — BP 137/84 | HR 79 | Ht 63.0 in | Wt 269.5 lb

## 2019-06-20 DIAGNOSIS — Z0000 Encounter for general adult medical examination without abnormal findings: Secondary | ICD-10-CM | POA: Diagnosis not present

## 2019-06-20 DIAGNOSIS — Z01419 Encounter for gynecological examination (general) (routine) without abnormal findings: Secondary | ICD-10-CM

## 2019-06-20 DIAGNOSIS — N898 Other specified noninflammatory disorders of vagina: Secondary | ICD-10-CM | POA: Diagnosis not present

## 2019-06-20 DIAGNOSIS — F17200 Nicotine dependence, unspecified, uncomplicated: Secondary | ICD-10-CM

## 2019-06-20 DIAGNOSIS — R87612 Low grade squamous intraepithelial lesion on cytologic smear of cervix (LGSIL): Secondary | ICD-10-CM

## 2019-06-20 DIAGNOSIS — Z8742 Personal history of other diseases of the female genital tract: Secondary | ICD-10-CM

## 2019-06-20 DIAGNOSIS — N6311 Unspecified lump in the right breast, upper outer quadrant: Secondary | ICD-10-CM

## 2019-06-20 DIAGNOSIS — Z1151 Encounter for screening for human papillomavirus (HPV): Secondary | ICD-10-CM | POA: Insufficient documentation

## 2019-06-20 NOTE — Progress Notes (Signed)
GYNECOLOGY ANNUAL PREVENTATIVE CARE ENCOUNTER NOTE  Subjective:   Julie Hendrix is a 34 y.o. [phone] female here for a routine annual gynecologic exam.  Current complaints: is currently smoking Bland and Mild, slight weigh gain this past year after losing previously, and has been diagnosed with BV previously but reports no odor today.   Denies abnormal vaginal bleeding, pelvic pain, problems with intercourse or other gynecologic concerns.    Gynecologic History Patient's last menstrual period was 05/05/2019 (approximate). Contraception: condoms Last Pap: 01-26-2017. Results were: abnormal Last mammogram: 05-23-2018 for breast lump Results were: normal  Obstetric History OB History  Gravida Para Term Preterm AB Living  3 2 2  0 1 2  SAB TAB Ectopic Multiple Live Births  0 1 0 0 2    # Outcome Date GA Lbr Len/2nd Weight Sex Delivery Anes PTL Lv  3 Term 06/16/03 [redacted]w[redacted]d   M Vag-Spont   LIV  2 Term 10/05/98 [redacted]w[redacted]d   F    LIV  1 TAB             Past Medical History:  Diagnosis Date  . Abnormal Pap smear   . 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 Dysplasia of cervix, low grade (CIN 1)   . HPV (human papilloma virus) anogenital infection     Past Surgical History:  Procedure Laterality Date  . COLPOSCOPY    . CRYOTHERAPY  06/08/2017   cervix    Current Outpatient Medications on File Prior to Visit  Medication Sig Dispense Refill  . omeprazole (PRILOSEC) 20 MG capsule Take 1 capsule (20 mg total) by mouth daily. 30 capsule 0  . amLODipine (NORVASC) 10 MG tablet Take 1 tablet (10 mg total) by mouth daily. F/u for hypertension. (Patient not taking: Reported on 06/20/2019) 90 tablet 0  . fluticasone (FLONASE) 50 MCG/ACT nasal spray Place 1 spray into both nostrils daily. 1 spray in each nostril every day (Patient not taking: Reported on 06/06/2019) 16 g 0   No current facility-administered medications on file prior to visit.     No Known Allergies  Social History   Socioeconomic History  . Marital status: Single    Spouse name: Not on file  . Number of children: Not on file  . Years of education: Not on file  . Highest education level: Not on file  Occupational History  . Not on file  Tobacco Use  . Smoking status: Current Every Day Smoker    Packs/day: 0.25    Types: Cigarettes, Cigars    Last attempt to quit: 11/22/2017    Years since quitting: 1.5  . Smokeless tobacco: Never Used  . Tobacco comment: quit Aug 2019  Substance and Sexual Activity  . Alcohol use: Yes    Alcohol/week: 6.0 standard drinks    Types: 2 Glasses of wine, 2 Cans of beer, 2 Shots of liquor per week    Comment: socially, more since her friend was killed  . Drug use: No  . Sexual activity: Yes    Birth control/protection: None  Other Topics Concern  . Not on file  Social History Narrative  . Not on file   Social Determinants of Health   Financial Resource Strain:   . Difficulty of Paying Living Expenses: Not on file  Food Insecurity:   . Worried About Charity fundraiser in the Last Year: Not on file  . Ran Out of Food in the Last  Year: Not on file  Transportation Needs:   . Lack of Transportation (Medical): Not on file  . Lack of Transportation (Non-Medical): Not on file  Physical Activity:   . Days of Exercise per Week: Not on file  . Minutes of Exercise per Session: Not on file  Stress:   . Feeling of Stress : Not on file  Social Connections:   . Frequency of Communication with Friends and Family: Not on file  . Frequency of Social Gatherings with Friends and Family: Not on file  . Attends Religious Services: Not on file  . Active Member of Clubs or Organizations: Not on file  . Attends Banker Meetings: Not on file  . Marital Status: Not on file  Intimate Partner Violence:   . Fear of Current or Ex-Partner: Not on file  . Emotionally Abused: Not on file  . Physically Abused: Not on file  .  Sexually Abused: Not on file    Family History  Problem Relation Age of Onset  . Hypertension Father   . Diabetes Father   . Heart attack Father   . Hypertension Mother     The following portions of the patient's history were reviewed and updated as appropriate: allergies, current medications, past family history, past medical history, past social history, past surgical history and problem list.  Review of Systems Pertinent items noted in HPI and remainder of comprehensive ROS otherwise negative.   Objective:  BP 137/84 (BP Location: Right Arm)   Pulse 79   Ht 5\' 3"  (1.6 m)   Wt 269 lb 8 oz (122.2 kg)   LMP 05/05/2019 (Approximate)   BMI 47.74 kg/m  CONSTITUTIONAL: Well-developed, well-nourished female in no acute distress.  HENT:  Normocephalic, atraumatic, External right and left ear normal.  EYES: Conjunctivae and EOM are normal. Pupils are equal, round.  No scleral icterus.  NECK: Normal range of motion, supple, no masses.  Normal thyroid.  SKIN: Skin is warm and dry. No rash noted. Not diaphoretic. No erythema. No pallor. NEUROLOGIC: Alert and oriented to person, place, and time. Normal reflexes, muscle tone coordination. No cranial nerve deficit noted. PSYCHIATRIC: Normal mood and affect. Normal behavior. Normal judgment and thought content. CARDIOVASCULAR: Normal heart rate noted, regular rhythm RESPIRATORY: Clear to auscultation bilaterally. Effort and breath sounds normal, no problems with respiration noted. BREASTS: Symmetric in size. No masses, skin changes, nipple drainage, or lymphadenopathy. ABDOMEN: Soft, no distention noted.  No tenderness, rebound or guarding.  PELVIC: Normal appearing external genitalia; normal appearing vaginal mucosa and cervix.  Pale yellow discharge seen at introitus but inside vagina, discharge did not seem remarkable.  Pap smear obtained.  Normal uterine size, no other palpable masses, no uterine or adnexal tenderness. Slight cervical  bleeding with Pap MUSCULOSKELETAL: Normal range of motion. No tenderness.  No cyanosis, clubbing, or edema.    Assessment and Plan:  1. Well woman exam with routine gynecological exam  - Cytology - PAP( [facility])  2. Vaginal discharge Has medication for BV at home that she has not used.  No problems with odor at this time.  - Cervicovaginal ancillary only( [facility])  3. Morbid obesity with body mass index (BMI) of 45.0 to 49.9 in adult [facility]) Discussed she needs additional support for continued weight loss and client is in agreement.  Suggested to consider Weight Watchers.  4. Hx of abnormal cervical Pap smear with cryo Done today  5.  Smoking Black and Milds now.  Advised to stop  smoking and quitline phone number given.  Encouraged her to follow up with the quitline.  Will follow up results of pap smear and manage accordingly. Routine preventative health maintenance measures emphasized. Please refer to After Visit Summary for other counseling recommendations.    Nolene Bernheim, RN, MSN, NP-BC Nurse Practitioner, [facility]

## 2019-06-20 NOTE — Progress Notes (Signed)
Pt states has pain on left side of stomach

## 2019-06-20 NOTE — Patient Instructions (Signed)
Call [phone]-Quit-Now

## 2019-06-21 LAB — CERVICOVAGINAL ANCILLARY ONLY
Bacterial Vaginitis (gardnerella): POSITIVE — AB
Comment: NEGATIVE

## 2019-06-21 LAB — CYTOLOGY - PAP
Comment: NEGATIVE
Diagnosis: NEGATIVE
High risk HPV: NEGATIVE

## 2019-07-10 ENCOUNTER — Other Ambulatory Visit: Payer: Self-pay

## 2019-07-10 ENCOUNTER — Telehealth: Payer: Self-pay

## 2019-07-10 DIAGNOSIS — N760 Acute vaginitis: Secondary | ICD-10-CM

## 2019-07-10 DIAGNOSIS — B9689 Other specified bacterial agents as the cause of diseases classified elsewhere: Secondary | ICD-10-CM

## 2019-07-10 MED ORDER — METRONIDAZOLE 500 MG PO TABS: 500.0000 mg | ORAL_TABLET | Freq: Two times a day (BID) | ORAL | 0 refills | Status: DC

## 2019-07-10 NOTE — Telephone Encounter (Signed)
Called pt to verify Pharmacy to send Flagy for +BV, advised pt to give 45 min to 1 hr to pick up.Patient verbalized understanding.

## 2019-07-12 ENCOUNTER — Telehealth: Payer: Self-pay | Admitting: *Deleted

## 2019-07-12 MED ORDER — METRONIDAZOLE 0.75 % VA GEL: VAGINAL | 0 refills | Status: DC

## 2019-07-12 NOTE — Telephone Encounter (Signed)
Pt left VM message stating that she wants Metrogel instead of pills for treatment of BV. Please send alternate Rx to pharmacy. Rx sent as requested and pt was notified that she may pick up the prescription today. She voiced understanding.

## 2019-07-24 ENCOUNTER — Encounter (HOSPITAL_COMMUNITY): Payer: Self-pay

## 2019-07-24 ENCOUNTER — Other Ambulatory Visit: Payer: Self-pay

## 2019-07-24 ENCOUNTER — Other Ambulatory Visit: Payer: Self-pay | Admitting: Family Medicine

## 2019-07-24 ENCOUNTER — Ambulatory Visit (HOSPITAL_COMMUNITY)
Admission: EM | Admit: 2019-07-24 | Discharge: 2019-07-24 | Disposition: A | Discharge status: Home / Self Care | Payer: Medicaid Other | Attending: Family Medicine | Admitting: Family Medicine

## 2019-07-24 DIAGNOSIS — I10 Essential (primary) hypertension: Secondary | ICD-10-CM

## 2019-07-24 MED ORDER — AMLODIPINE BESYLATE 10 MG PO TABS: 10.0000 mg | ORAL_TABLET | Freq: Every day | ORAL | 0 refills | Status: DC

## 2019-07-24 NOTE — ED Triage Notes (Signed)
Pt states she took her bp today and it was 156/95. Pt states she's on bp meds. Pt states she's been feeling jittery all day.

## 2019-07-24 NOTE — ED Provider Notes (Signed)
[facility]    CSN: [id number] Arrival date & time: 07/24/19  1300      History   Chief Complaint Chief Complaint  Patient presents with  . Hypertension    HPI [facility] Julie Hendrix is a 34 y.o. female.   HPI  Patient had a transient blood pressure elevation this morning it was 156/95.  She was feeling a little jittery and nervous.  She is out of her blood pressure medication.  She took her last pill yesterday, and her medicine is refilled at the pharmacy.  She is going to go pick it up when she finishes with the visit. Currently she states she is feeling well. Her blood pressure at this office is normal.  It is 125/88.  No headache, visual symptoms, nausea. 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  She has fears of uncontrolled blood pressure and stroke. She is under the care of her primary care doctor and goes for regular assessments.  Past Medical History:  Diagnosis Date  . Abnormal Pap smear   . 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 Dysplasia of cervix, low grade (CIN 1)   . HPV (human papilloma virus) anogenital infection     Patient Active Problem List   Diagnosis Date Noted  . Gastroesophageal reflux disease 06/06/2019  . Missed period 06/06/2019  . Smoker 02/18/2019  . Prediabetes 06/17/2018  . Mumps without complication 05/18/2018  . Essential hypertension 05/18/2018  . Morbid obesity with body mass index (BMI) of 45.0 to 49.9 in adult [facility]) 11/30/2017  . PCOS (polycystic ovarian syndrome) 11/30/2017  . Low grade squamous intraepithelial lesion on cytologic smear of cervix (LGSIL) 05/21/2017  . 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 Breast lump on right side at 10 o'clock position 01/31/2013    Past Surgical History:  Procedure Laterality Date  . COLPOSCOPY    . CRYOTHERAPY  06/08/2017   cervix    OB History    Gravida  3   Para  2   Term  2   Preterm  0   AB  1   Living  2     SAB  0   TAB  1   Ectopic  0   Multiple  0   Live Births  2            Home Medications    Prior to Admission medications   Medication Sig Start Date End Date Taking? Authorizing Provider  amLODipine (NORVASC) 10 MG tablet Take 1 tablet (10 mg total) by mouth daily. F/u for hypertension. 07/24/19   Allayne Stack, DO  fluticasone (FLONASE) 50 MCG/ACT nasal spray Place 1 spray into both nostrils daily. 1 spray in each nostril every day Patient not taking: Reported on 06/06/2019 02/17/19   Allayne Stack, DO  omeprazole (PRILOSEC) 20 MG capsule Take 1 capsule (20 mg total) by mouth daily. 06/06/19 07/06/19  Garnette Gunner, MD    Family History Family History  Problem Relation Age of Onset  . Hypertension Father   . Diabetes Father   . Heart attack Father   . Hypertension Mother     Social History Social History   Tobacco Use  . Smoking status: Current Every Day Smoker    Packs/day: 0.25    Types: Cigarettes, Cigars    Last attempt to quit: 11/22/2017    Years since quitting: 1.6  . Smokeless tobacco: Never  Used  . Tobacco comment: quit Aug 2019  Substance Use Topics  . Alcohol use: Yes    Alcohol/week: 9.0 standard drinks    Types: 3 Cans of beer, 3 Glasses of wine, 3 Shots of liquor per week  . Drug use: No     Allergies   Patient has no known allergies.   Review of Systems Review of Systems  Constitutional:       Patient states she feels well.  Was jittery this morning but this is resolved  All other systems reviewed and are negative.    Physical Exam Triage Vital Signs ED Triage Vitals  Enc Vitals Group     BP 07/24/19 1424 125/88     Pulse Rate 07/24/19 1424 71     Resp 07/24/19 1424 18     Temp 07/24/19 1424 97.7 F (36.5 C)     Temp Source 07/24/19 1424 Oral     SpO2 07/24/19 1424 99 %     Weight 07/24/19 1425 260 lb (117.9 kg)     Height 07/24/19 1425 5\' 3"  (1.6 m)     Head Circumference --       Peak Flow --      Pain Score 07/24/19 1425 0     Pain Loc --      Pain Edu? --      Excl. in GC? --    No data found.  Updated Vital Signs BP 125/88   Pulse 71   Temp 97.7 F (36.5 C) (Oral)   Resp 18   Ht 5\' 3"  (1.6 m)   Wt 117.9 kg   SpO2 99%   BMI 46.06 kg/m   Visual Acuity Right Eye Distance:   Left Eye Distance:   Bilateral Distance:    Right Eye Near:   Left Eye Near:    Bilateral Near:     Physical Exam Constitutional:      General: She is not in acute distress.    Appearance: She is well-developed.     Comments: Overweight  HENT:     Head: Normocephalic and atraumatic.     Mouth/Throat:     Comments: Mask in place Eyes:     Conjunctiva/sclera: Conjunctivae normal.     Pupils: Pupils are equal, round, and reactive to light.  Cardiovascular:     Rate and Rhythm: Normal rate and regular rhythm.     Heart sounds: Normal heart sounds.     Comments: Heart and lung evaluation is normal Pulmonary:     Effort: Pulmonary effort is normal. No respiratory distress.  Abdominal:     General: There is no distension.     Palpations: Abdomen is soft.  Musculoskeletal:        General: Normal range of motion.     Cervical back: Normal range of motion.  Skin:    General: Skin is warm and dry.  Neurological:     Mental Status: She is alert.  Psychiatric:        Mood and Affect: Mood normal.        Behavior: Behavior normal.      UC Treatments / Results  Labs (all labs ordered are listed, but only abnormal results are displayed) Labs Reviewed - No data to display  EKG   Radiology No results found.  Procedures Procedures (including critical care time)  Medications Ordered in UC Medications - No data to display  Initial Impression / Assessment and Plan / UC Course  I have reviewed  the triage vital signs and the nursing notes.  Pertinent labs & imaging results that were available during my care of the patient were reviewed by me and considered  in my medical decision making (see chart for details).     We talked about blood pressure.  The fact that it does go up and down.  Does not need to worry about an Isolated elevated reading.  Will go to the pharmacy today and pick up her amlodipine.. Final Clinical Impressions(s) / UC Diagnoses   Final diagnoses:  Essential hypertension     Discharge Instructions     Take your blood pressure medicine daily Return here as needed    ED Prescriptions    None     PDMP not reviewed this encounter.   Raylene Everts, MD 07/24/19 [phone]

## 2019-07-24 NOTE — Discharge Instructions (Signed)
Take your blood pressure medicine daily Return here as needed

## 2019-07-31 ENCOUNTER — Other Ambulatory Visit: Payer: Self-pay

## 2019-07-31 ENCOUNTER — Encounter (HOSPITAL_COMMUNITY): Payer: Self-pay | Admitting: Emergency Medicine

## 2019-07-31 ENCOUNTER — Ambulatory Visit (HOSPITAL_COMMUNITY)
Admission: EM | Admit: 2019-07-31 | Discharge: 2019-07-31 | Disposition: A | Discharge status: Home / Self Care | Payer: Medicaid Other | Attending: Family Medicine | Admitting: Family Medicine

## 2019-07-31 DIAGNOSIS — S50369A Insect bite (nonvenomous) of unspecified elbow, initial encounter: Secondary | ICD-10-CM | POA: Insufficient documentation

## 2019-07-31 DIAGNOSIS — Z8249 Family history of ischemic heart disease and other diseases of the circulatory system: Secondary | ICD-10-CM | POA: Diagnosis not present

## 2019-07-31 DIAGNOSIS — K219 Gastro-esophageal reflux disease without esophagitis: Secondary | ICD-10-CM | POA: Diagnosis not present

## 2019-07-31 DIAGNOSIS — F17210 Nicotine dependence, cigarettes, uncomplicated: Secondary | ICD-10-CM | POA: Insufficient documentation

## 2019-07-31 DIAGNOSIS — Z79899 Other long term (current) drug therapy: Secondary | ICD-10-CM | POA: Diagnosis not present

## 2019-07-31 DIAGNOSIS — Z833 Family history of diabetes mellitus: Secondary | ICD-10-CM | POA: Insufficient documentation

## 2019-07-31 DIAGNOSIS — I10 Essential (primary) hypertension: Secondary | ICD-10-CM | POA: Insufficient documentation

## 2019-07-31 DIAGNOSIS — W57XXXA Bitten or stung by nonvenomous insect and other nonvenomous arthropods, initial encounter: Secondary | ICD-10-CM | POA: Diagnosis not present

## 2019-07-31 DIAGNOSIS — Z20822 Contact with and (suspected) exposure to covid-19: Secondary | ICD-10-CM | POA: Diagnosis not present

## 2019-07-31 MED ORDER — TRIAMCINOLONE ACETONIDE 0.1 % EX CREA: 1.0000 "application " | TOPICAL_CREAM | Freq: Two times a day (BID) | CUTANEOUS | 0 refills | Status: DC

## 2019-07-31 NOTE — ED Triage Notes (Signed)
Pt here for covid testing after possible exposure last Thursday; pt also sts insect bites to arms that are itching

## 2019-07-31 NOTE — ED Provider Notes (Signed)
[place]    CSN: [id number] Arrival date & time: 07/31/19  1727      History   Chief Complaint Chief Complaint  Patient presents with  . covid test  . Insect Bite    HPI Alda Berthold Charron Coultas is a 34 y.o. female history of hypertension, GERD, presenting today for evaluation of Covid testing after exposure along with insect bites.  Patient notes that she had exposure last Thursday, approximately 4-5 days ago for a few hours unmasked.  She denies any specific symptoms, denies fevers chills or body aches.  Denies URI symptoms of cough congestion or sore throat.  She has had a mild headache as well as some mild nausea, but does report some drinking this weekend.  She also is concerned about some bug bites to bilateral elbow areas.  Has had associated itching.  Denies pain.  HPI  Past Medical History:  Diagnosis Date  . Abnormal Pap smear   . 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 Dysplasia of cervix, low grade (CIN 1)   . HPV (human papilloma virus) anogenital infection     Patient Active Problem List   Diagnosis Date Noted  . Gastroesophageal reflux disease 06/06/2019  . Missed period 06/06/2019  . Smoker 02/18/2019  . Prediabetes 06/17/2018  . Mumps without complication 29/51/8841  . Essential hypertension 05/18/2018  . Morbid obesity with body mass index (BMI) of 45.0 to 49.9 in adult [facility]) 11/30/2017  . PCOS (polycystic ovarian syndrome) 11/30/2017  . Low grade squamous intraepithelial lesion on cytologic smear of cervix (LGSIL) 05/21/2017  . 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 Breast lump on right side at 10 o'clock position 01/31/2013    Past Surgical History:  Procedure Laterality Date  . COLPOSCOPY    . CRYOTHERAPY  06/08/2017   cervix    OB History    Gravida  3   Para  2   Term  2   Preterm  0   AB  1   Living  2     SAB  0   TAB  1   Ectopic  0   Multiple  0   Live Births  2            Home Medications    Prior to Admission medications   Medication Sig Start Date End Date Taking? Authorizing Provider  amLODipine (NORVASC) 10 MG tablet Take 1 tablet (10 mg total) by mouth daily. F/u for hypertension. 07/24/19   Patriciaann Clan, DO  fluticasone (FLONASE) 50 MCG/ACT nasal spray Place 1 spray into both nostrils daily. 1 spray in each nostril every day Patient not taking: Reported on 06/06/2019 02/17/19   Patriciaann Clan, DO  omeprazole (PRILOSEC) 20 MG capsule Take 1 capsule (20 mg total) by mouth daily. 06/06/19 07/06/19  Bonnita Hollow, MD  triamcinolone cream (KENALOG) 0.1 % Apply 1 application topically 2 (two) times daily. 07/31/19   Shondrea Steinert, Elesa Hacker, PA-C    Family History Family History  Problem Relation Age of Onset  . Hypertension Father   . Diabetes Father   . Heart attack Father   . Hypertension Mother     Social History Social History   Tobacco Use  . Smoking status: Current Every Day Smoker    Packs/day: 0.25    Types: Cigarettes, Cigars    Last attempt to quit: 11/22/2017    Years since quitting: 1.6  . Smokeless  tobacco: Never Used  . Tobacco comment: quit Aug 2019  Substance Use Topics  . Alcohol use: Yes    Alcohol/week: 9.0 standard drinks    Types: 3 Cans of beer, 3 Glasses of wine, 3 Shots of liquor per week  . Drug use: No     Allergies   Patient has no known allergies.   Review of Systems Review of Systems  Constitutional: Negative for activity change, appetite change, chills, fatigue and fever.  HENT: Negative for congestion, ear pain, rhinorrhea, sinus pressure, sore throat and trouble swallowing.   Eyes: Negative for discharge and redness.  Respiratory: Negative for cough, chest tightness and shortness of breath.   Cardiovascular: Negative for chest pain.  Gastrointestinal: Negative for abdominal pain, diarrhea, nausea and vomiting.  Musculoskeletal: Negative for myalgias.    Skin: Positive for color change and rash.  Neurological: Negative for dizziness, light-headedness and headaches.     Physical Exam Triage Vital Signs ED Triage Vitals [07/31/19 1810]  Enc Vitals Group     BP (!) 160/97     Pulse Rate 88     Resp 18     Temp 98 F (36.7 C)     Temp Source Oral     SpO2 96 %     Weight      Height      Head Circumference      Peak Flow      Pain Score 0     Pain Loc      Pain Edu?      Excl. in GC?    No data found.  Updated Vital Signs BP (!) 160/97 (BP Location: Right Arm)   Pulse 88   Temp 98 F (36.7 C) (Oral)   Resp 18   SpO2 96%   Visual Acuity Right Eye Distance:   Left Eye Distance:   Bilateral Distance:    Right Eye Near:   Left Eye Near:    Bilateral Near:     Physical Exam Vitals and nursing note reviewed.  Constitutional:      Appearance: She is well-developed.     Comments: No acute distress  HENT:     Head: Normocephalic and atraumatic.     Ears:     Comments: Bilateral ears without tenderness to palpation of external auricle, tragus and mastoid, EAC's without erythema or swelling, TM's with good bony landmarks and cone of light. Non erythematous.     Nose: Nose normal.     Mouth/Throat:     Comments: Oral mucosa pink and moist, no tonsillar enlargement or exudate. Posterior pharynx patent and nonerythematous, no uvula deviation or swelling. Normal phonation. Eyes:     Conjunctiva/sclera: Conjunctivae normal.  Cardiovascular:     Rate and Rhythm: Normal rate.  Pulmonary:     Effort: Pulmonary effort is normal. No respiratory distress.     Comments: Breathing comfortably at rest, CTABL, no wheezing, rales or other adventitious sounds auscultated Abdominal:     General: There is no distension.  Musculoskeletal:        General: Normal range of motion.     Cervical back: Neck supple.  Skin:    General: Skin is warm and dry.     Comments: Bilateral elbow areas with raised erythematous lesions, central  scabbing from excoriation  Neurological:     Mental Status: She is alert and oriented to person, place, and time.      UC Treatments / Results  Labs (all labs ordered are  listed, but only abnormal results are displayed) Labs Reviewed - No data to display  EKG   Radiology No results found.  Procedures Procedures (including critical care time)  Medications Ordered in UC Medications - No data to display  Initial Impression / Assessment and Plan / UC Course  I have reviewed the triage vital signs and the nursing notes.  Pertinent labs & imaging results that were available during my care of the patient were reviewed by me and considered in my medical decision making (see chart for details).  Clinical Course as of Jul 31 1835  Mon Jul 31, 2019  1829 137/87 BP rechecked   [HW]    Clinical Course User Index [HW] Martesha Niedermeier C, PA-C    Covid PCR pending after exposure, relatively asymptomatic currently.  Exam suggestive of likely insect bites, will treat symptomatically, recommend triamcinolone topically as well as may use antihistamines for itching.  Discussed strict return precautions. Patient verbalized understanding and is agreeable with plan.  Final Clinical Impressions(s) / UC Diagnoses   Final diagnoses:  Exposure to COVID-19 virus  Insect bite of elbow, unspecified laterality, initial encounter     Discharge Instructions     COVID swab pending- monitor Mychart for results Triamcinolone cream for bug bites- follow up if appearing to get infected   ED Prescriptions    Medication Sig Dispense Auth. Provider   triamcinolone cream (KENALOG) 0.1 % Apply 1 application topically 2 (two) times daily. 45 g Keerstin Bjelland, [place] C, PA-C     PDMP not reviewed this encounter.   Lew Dawes, [place] 07/31/19 1840

## 2019-07-31 NOTE — Discharge Instructions (Signed)
COVID swab pending- monitor Mychart for results Triamcinolone cream for bug bites- follow up if appearing to get infected

## 2019-08-01 LAB — SARS CORONAVIRUS 2 (TAT 6-24 HRS): SARS Coronavirus 2: NEGATIVE

## 2019-08-09 ENCOUNTER — Other Ambulatory Visit: Payer: Self-pay

## 2019-08-09 ENCOUNTER — Ambulatory Visit (INDEPENDENT_AMBULATORY_CARE_PROVIDER_SITE_OTHER): Payer: Medicaid Other | Admitting: Family Medicine

## 2019-08-09 VITALS — BP 130/70 | HR 96 | Ht 63.0 in | Wt 264.2 lb

## 2019-08-09 DIAGNOSIS — R109 Unspecified abdominal pain: Secondary | ICD-10-CM | POA: Insufficient documentation

## 2019-08-09 MED ORDER — DICYCLOMINE HCL 10 MG PO CAPS: 10.0000 mg | ORAL_CAPSULE | Freq: Three times a day (TID) | ORAL | 1 refills | Status: DC

## 2019-08-09 NOTE — Progress Notes (Signed)
    SUBJECTIVE:   CHIEF COMPLAINT / HPI:   Patient with history of 4 days of abdominal pain with diarrhea.  Diarrhea has since stopped.  Patient still has feelings of crampy abdominal pain, bloating, left upper quadrant sharp pain, general sense of fullness.  Patient denies any blood in her stool, sour taste in her mouth, burning sensation, dysuria, fevers, chills.  Patient is tolerating p.o. well.  It does not seem to be triggered by food or position.  Patient denies sick contacts.  PERTINENT  PMH / PSH:  Noncontributory  OBJECTIVE:   BP 130/70   Pulse 96   Ht 5\' 3"  (1.6 m)   Wt 264 lb 4 oz (119.9 kg)   LMP 07/24/2019   SpO2 97%   BMI 46.81 kg/m   Gen: NAD, resting comfortably CV: RRR with no murmurs appreciated Pulm: NWOB, CTAB with no crackles, wheezes, or rhonchi GI: Soft, Nontender, Nondistended. MSK: no edema, cyanosis, or clubbing noted Skin: warm, dry Neuro: grossly normal, moves all extremities Psych: Normal affect and thought content   ASSESSMENT/PLAN:   No problem-specific Assessment & Plan notes found for this encounter.     09/23/2019, MD [facility]

## 2019-08-09 NOTE — Patient Instructions (Signed)

## 2019-08-09 NOTE — Assessment & Plan Note (Signed)
Patient presents with a residual abdominal cramping and discomfort after recent diarrheal illness, presumed to be infectious.  Tolerating p.o. well.  No red flag symptoms.  Cannot rule out recurrence of GERD -Trial of antispasmodic and resumption of patient's PPI -Return precautions discussed, if persistent consider getting urine pregnancy test

## 2019-09-06 ENCOUNTER — Ambulatory Visit (INDEPENDENT_AMBULATORY_CARE_PROVIDER_SITE_OTHER): Payer: Medicaid Other | Admitting: Family Medicine

## 2019-09-06 ENCOUNTER — Other Ambulatory Visit: Payer: Self-pay

## 2019-09-06 ENCOUNTER — Encounter: Payer: Self-pay | Admitting: Family Medicine

## 2019-09-06 DIAGNOSIS — M549 Dorsalgia, unspecified: Secondary | ICD-10-CM | POA: Insufficient documentation

## 2019-09-06 DIAGNOSIS — M545 Low back pain, unspecified: Secondary | ICD-10-CM

## 2019-09-06 MED ORDER — MELOXICAM 7.5 MG PO TABS: 7.5000 mg | ORAL_TABLET | Freq: Every day | ORAL | 0 refills | Status: DC

## 2019-09-06 NOTE — Patient Instructions (Signed)
Acute Back Pain, Adult Acute back pain is sudden and usually short-lived. It is often caused by an injury to the muscles and tissues in the back. The injury may result from:  A muscle or ligament getting overstretched or torn (strained). Ligaments are tissues that connect bones to each other. Lifting something improperly can cause a back strain.  Wear and tear (degeneration) of the spinal disks. Spinal disks are circular tissue that provides cushioning between the bones of the spine (vertebrae).  Twisting motions, such as while playing sports or doing yard work.  A hit to the back.  Arthritis. You may have a physical exam, lab tests, and imaging tests to find the cause of your pain. Acute back pain usually goes away with rest and home care. Follow these instructions at home: Managing pain, stiffness, and swelling  Take over-the-counter and prescription medicines only as told by your health care provider.  Your health care provider may recommend applying ice during the first 24-48 hours after your pain starts. To do this: ? Put ice in a plastic bag. ? Place a towel between your skin and the bag. ? Leave the ice on for 20 minutes, 2-3 times a day.  If directed, apply heat to the affected area as often as told by your health care provider. Use the heat source that your health care provider recommends, such as a moist heat pack or a heating pad. ? Place a towel between your skin and the heat source. ? Leave the heat on for 20-30 minutes. ? Remove the heat if your skin turns bright red. This is especially important if you are unable to feel pain, heat, or cold. You have a greater risk of getting burned. Activity   Do not stay in bed. Staying in bed for more than 1-2 days can delay your recovery.  Sit up and stand up straight. Avoid leaning forward when you sit, or hunching over when you stand. ? If you work at a desk, sit close to it so you do not need to lean over. Keep your chin tucked  in. Keep your neck drawn back, and keep your elbows bent at a right angle. Your arms should look like the letter "L." ? Sit high and close to the steering wheel when you drive. Add lower back (lumbar) support to your car seat, if needed.  Take short walks on even surfaces as soon as you are able. Try to increase the length of time you walk each day.  Do not sit, drive, or stand in one place for more than 30 minutes at a time. Sitting or standing for long periods of time can put stress on your back.  Do not drive or use heavy machinery while taking prescription pain medicine.  Use proper lifting techniques. When you bend and lift, use positions that put less stress on your back: ? Bend your knees. ? Keep the load close to your body. ? Avoid twisting.  Exercise regularly as told by your health care provider. Exercising helps your back heal faster and helps prevent back injuries by keeping muscles strong and flexible.  Work with a physical therapist to make a safe exercise program, as recommended by your health care provider. Do any exercises as told by your physical therapist. Lifestyle  Maintain a healthy weight. Extra weight puts stress on your back and makes it difficult to have good posture.  Avoid activities or situations that make you feel anxious or stressed. Stress and anxiety increase muscle   tension and can make back pain worse. Learn ways to manage anxiety and stress, such as through exercise. General instructions  Sleep on a firm mattress in a comfortable position. Try lying on your side with your knees slightly bent. If you lie on your back, put a pillow under your knees.  Follow your treatment plan as told by your health care provider. This may include: ? Cognitive or behavioral therapy. ? Acupuncture or massage therapy. ? Meditation or yoga. Contact a health care provider if:  You have pain that is not relieved with rest or medicine.  You have increasing pain going down  into your legs or buttocks.  Your pain does not improve after 2 weeks.  You have pain at night.  You lose weight without trying.  You have a fever or chills. Get help right away if:  You develop new bowel or bladder control problems.  You have unusual weakness or numbness in your arms or legs.  You develop nausea or vomiting.  You develop abdominal pain.  You feel faint. Summary  Acute back pain is sudden and usually short-lived.  Use proper lifting techniques. When you bend and lift, use positions that put less stress on your back.  Take over-the-counter and prescription medicines and apply heat or ice as directed by your health care provider. This information is not intended to replace advice given to you by your health care provider. Make sure you discuss any questions you have with your health care provider. Document Revised: 07/26/2018 Document Reviewed: 11/18/2016 Elsevier Patient Education  2020 [employer]   1.  You can use meloxicam daily as needed for pain 2.  Try over-the-counter Voltaren gel and capsaicin cream over the area for pain. 3.  Please try stretching as tolerable 4.  Please use heating pads 5.  Follow-up in 2 months if no improvement

## 2019-09-06 NOTE — Assessment & Plan Note (Signed)
With acute back pain x3 days.  Likely strain.  Advised to use heating pads daily.  Can use meloxicam daily for pain and to help reduce inflammation.  Continues to help.  Gel or capsaicin cream as needed as well.  Advised to continue stretching.  Follow-up in 2 months if no improvement.  If pain persists over 6 weeks can consider imaging at that time.  Patient has full range of motion which is reassuring.  No red flag symptoms at this time.  Follow-up in 2 months.

## 2019-09-06 NOTE — Progress Notes (Signed)
    SUBJECTIVE:   CHIEF COMPLAINT / HPI:   Back pain Patient presenting for back pain x3 days, but worse in the past couple of days. Pain is worse with leg movement, such as putting on clothes. Also worse with bending to sit. Denies trauma. Denies heavy lifting. Does bend over a lot, trying to use legs over back when she does lift. 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 Pain is especially worse when she lifts her legs. Pain is in low back, bilateral. Denies incontinence. Denies numbness, tingling, weakness. No skin changes. No fevers. Does report increased urinary frequency, but is drinking more water. Denies dysuria or hematuria.   PERTINENT  PMH / PSH: HTN, GERD, PCOS, pre-diabetes, morbid obesity,   OBJECTIVE:   BP 118/78   Pulse 79   Ht 5\' 3"  (1.6 m)   Wt 264 lb 3.2 oz (119.8 kg)   LMP 08/27/2019   SpO2 93%   BMI 46.80 kg/m   Gen: awake and alert, NAD Back:  Normal-appearing skin overlying back, no bruising.  Spine with normal alignment and no deformity.  No tenderness to vertebral process palpation.  Paraspinous muscles are TTP in lumbar and no spasm.   Range of motion full at neck and  lumbar sacral regions.  Straight leg raise is negative. Able to get onto exam table without difficulty or assistance   Neuro:  Sensation and motor function 5/5 bilateral lower extremities. Normal gait     ASSESSMENT/PLAN:   Back pain With acute back pain x3 days.  Likely strain.  Advised to use heating pads daily.  Can use meloxicam daily for pain and to help reduce inflammation.  Continues to help.  Gel or capsaicin cream as needed as well.  Advised to continue stretching.  Follow-up in 2 months if no improvement.  If pain persists over 6 weeks can consider imaging at that time.  Patient has full range of motion which is reassuring.  No red flag symptoms at this time.  Follow-up in 2 months.     10/27/2019, DO [facility]

## 2019-09-07 ENCOUNTER — Ambulatory Visit (HOSPITAL_COMMUNITY)
Admission: EM | Admit: 2019-09-07 | Discharge: 2019-09-07 | Disposition: A | Discharge status: Home / Self Care | Payer: Medicaid Other | Attending: Family Medicine | Admitting: Family Medicine

## 2019-09-07 ENCOUNTER — Other Ambulatory Visit: Payer: Self-pay

## 2019-09-07 ENCOUNTER — Encounter (HOSPITAL_COMMUNITY): Payer: Self-pay

## 2019-09-07 DIAGNOSIS — S39012D Strain of muscle, fascia and tendon of lower back, subsequent encounter: Secondary | ICD-10-CM

## 2019-09-07 DIAGNOSIS — M549 Dorsalgia, unspecified: Secondary | ICD-10-CM

## 2019-09-07 LAB — POCT URINALYSIS DIP (DEVICE)
Bilirubin Urine: NEGATIVE
Glucose, UA: NEGATIVE mg/dL
Ketones, ur: NEGATIVE mg/dL
Leukocytes,Ua: NEGATIVE
Nitrite: NEGATIVE
Protein, ur: NEGATIVE mg/dL
Specific Gravity, Urine: 1.015 (ref 1.005–1.030)
Urobilinogen, UA: 0.2 mg/dL (ref 0.0–1.0)
pH: 5 (ref 5.0–8.0)

## 2019-09-07 MED ORDER — TIZANIDINE HCL 4 MG PO TABS: 4.0000 mg | ORAL_TABLET | Freq: Four times a day (QID) | ORAL | 0 refills | Status: DC | PRN

## 2019-09-07 NOTE — Discharge Instructions (Addendum)
Continue meloxicam daily This is an anti-inflammatory pain medication Add tizanidine as needed for muscle relaxer This is useful at bedtime Continue warmth to back, gentle stretching, activity as tolerated Follow-up with your primary care doctor

## 2019-09-07 NOTE — ED Triage Notes (Signed)
Pt c/o 8/10 sharp aching pain in lower backx4 days. Pt c/o sharp pain in groin area bilat. Pt c/o frequent urination.

## 2019-09-07 NOTE — ED Provider Notes (Signed)
[facility]    CSN: [id number] Arrival date & time: 09/07/19  1534      History   Chief Complaint Chief Complaint  Patient presents with  . Back Pain    HPI Julie Hendrix is a 34 y.o. female.   HPI  Patient was seen by her family doctor yesterday for a lumbar strain  She was prescribed meloxicam She was given written information about her condition She was discharged to come back if she fails to improve Patient is concerned because a urinalysis was not performed.  Today she had some pain in her suprapubic region, and had some pain that radiated into the both upper anterior thighs. 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  No bowel or bladder complaints. Urinalysis here is negative Normal menstrual periods.  No concern for vaginal infection   Past Medical History:  Diagnosis Date  . Abnormal Pap smear   . 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 Dysplasia of cervix, low grade (CIN 1)   . HPV (human papilloma virus) anogenital infection     Patient Active Problem List   Diagnosis Date Noted  . Back pain 09/06/2019  . Abdominal cramping 08/09/2019  . Gastroesophageal reflux disease 06/06/2019  . Missed period 06/06/2019  . Smoker 02/18/2019  . Prediabetes 06/17/2018  . Mumps without complication 05/18/2018  . Essential hypertension 05/18/2018  . Morbid obesity with body mass index (BMI) of 45.0 to 49.9 in adult [facility]) 11/30/2017  . PCOS (polycystic ovarian syndrome) 11/30/2017  . Low grade squamous intraepithelial lesion on cytologic smear of cervix (LGSIL) 05/21/2017  . 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 Breast lump on right side at 10 o'clock position 01/31/2013    Past Surgical History:  Procedure Laterality Date  . COLPOSCOPY    . CRYOTHERAPY  06/08/2017   cervix    OB  History    Gravida  3   Para  2   Term  2   Preterm  0   AB  1   Living  2     SAB  0   TAB  1   Ectopic  0   Multiple  0   Live Births  2            Home Medications    Prior to Admission medications   Medication Sig Start Date End Date Taking? Authorizing Provider  amLODipine (NORVASC) 10 MG tablet Take 1 tablet (10 mg total) by mouth daily. F/u for hypertension. 07/24/19   Allayne Stack, DO  fluticasone (FLONASE) 50 MCG/ACT nasal spray Place 1 spray into both nostrils daily. 1 spray in each nostril every day Patient not taking: Reported on 06/06/2019 02/17/19   Allayne Stack, DO  meloxicam (MOBIC) 7.5 MG tablet Take 1 tablet (7.5 mg total) by mouth daily. 09/06/19   Oralia Manis, DO  tiZANidine (ZANAFLEX) 4 MG tablet Take 1-2 tablets (4-8 mg total) by mouth every 6 (six) hours as needed for muscle spasms. 09/07/19   Eustace Moore, MD  triamcinolone cream (KENALOG) 0.1 % Apply 1 application topically 2 (two) times daily. 07/31/19   Wieters, Hallie C, PA-C  dicyclomine (BENTYL) 10 MG capsule Take 1 capsule (10 mg total) by mouth 4 (four) times daily -  before meals and at bedtime. 08/09/19 09/07/19  Garnette Gunner,  MD  omeprazole (PRILOSEC) 20 MG capsule Take 1 capsule (20 mg total) by mouth daily. 06/06/19 09/07/19  Garnette Gunner, MD    Family History Family History  Problem Relation Age of Onset  . Hypertension Father   . Diabetes Father   . Heart attack Father   . Hypertension Mother     Social History Social History   Tobacco Use  . Smoking status: Current Every Day Smoker    Packs/day: 0.25    Types: Cigarettes, Cigars    Last attempt to quit: 11/22/2017    Years since quitting: 1.7  . Smokeless tobacco: Never Used  . Tobacco comment: black and mild  Substance Use Topics  . Alcohol use: Yes    Alcohol/week: 9.0 standard drinks    Types: 3 Glasses of wine, 3 Cans of beer, 3 Shots of liquor per week  . Drug use: No     Allergies     Patient has no known allergies.   Review of Systems Review of Systems  Genitourinary: Negative for dysuria and frequency.  Musculoskeletal: Positive for back pain.  Neurological: Negative for weakness and numbness.     Physical Exam Triage Vital Signs ED Triage Vitals  Enc Vitals Group     BP 09/07/19 1627 129/83     Pulse Rate 09/07/19 1627 85     Resp 09/07/19 1627 18     Temp 09/07/19 1627 98 F (36.7 C)     Temp Source 09/07/19 1627 Oral     SpO2 09/07/19 1627 97 %     Weight 09/07/19 1628 264 lb (119.7 kg)     Height 09/07/19 1628 5\' 3"  (1.6 m)     Head Circumference --      Peak Flow --      Pain Score 09/07/19 1628 8     Pain Loc --      Pain Edu? --      Excl. in GC? --    No data found.  Updated Vital Signs BP 129/83   Pulse 85   Temp 98 F (36.7 C) (Oral)   Resp 18   Ht 5\' 3"  (1.6 m)   Wt 119.7 kg   LMP 08/27/2019   SpO2 97%   BMI 46.77 kg/m      Physical Exam Constitutional:      General: She is not in acute distress.    Appearance: She is well-developed. She is obese.  HENT:     Head: Normocephalic and atraumatic.     Mouth/Throat:     Comments: Mask is in place Eyes:     Conjunctiva/sclera: Conjunctivae normal.     Pupils: Pupils are equal, round, and reactive to light.  Cardiovascular:     Rate and Rhythm: Normal rate.  Pulmonary:     Effort: Pulmonary effort is normal. No respiratory distress.  Abdominal:     General: There is no distension.     Palpations: Abdomen is soft.     Comments: Abdomen is rotund.  Nontender  Musculoskeletal:        General: Normal range of motion.     Cervical back: Normal range of motion.     Comments: Back is tender centrally at the L5-S1 junction.  Mild tenderness in the lumbar muscles.  Strength sensation range of motion and reflexes are normal in both lower extremities.  Patient complains of increased back pain with straight leg raise maneuver  Skin:    General: Skin is warm and dry.  Neurological:     Mental Status: She is alert.     Motor: No weakness.     Coordination: Coordination normal.     Gait: Gait normal.     Deep Tendon Reflexes: Reflexes normal.  Psychiatric:        Mood and Affect: Mood normal.        Behavior: Behavior normal.      UC Treatments / Results  Labs (all labs ordered are listed, but only abnormal results are displayed) Labs Reviewed  POCT URINALYSIS DIP (DEVICE) - Abnormal; Notable for the following components:      Result Value   Hgb urine dipstick TRACE (*)    All other components within normal limits    EKG   Radiology No results found.  Procedures Procedures (including critical care time)  Medications Ordered in UC Medications - No data to display  Initial Impression / Assessment and Plan / UC Course  I have reviewed the triage vital signs and the nursing notes.  Pertinent labs & imaging results that were available during my care of the patient were reviewed by me and considered in my medical decision making (see chart for details).     Final Clinical Impressions(s) / UC Diagnoses   Final diagnoses:  Strain of lumbar region, subsequent encounter     Discharge Instructions     Continue meloxicam daily This is an anti-inflammatory pain medication Add tizanidine as needed for muscle relaxer This is useful at bedtime Continue warmth to back, gentle stretching, activity as tolerated Follow-up with your primary care doctor   ED Prescriptions    Medication Sig Dispense Auth. Provider   tiZANidine (ZANAFLEX) 4 MG tablet Take 1-2 tablets (4-8 mg total) by mouth every 6 (six) hours as needed for muscle spasms. 21 tablet Raylene Everts, MD     PDMP not reviewed this encounter.   Raylene Everts, MD 09/07/19 [phone]

## 2019-09-11 ENCOUNTER — Other Ambulatory Visit: Payer: Self-pay

## 2019-09-11 ENCOUNTER — Encounter (HOSPITAL_COMMUNITY): Payer: Self-pay

## 2019-09-11 ENCOUNTER — Emergency Department (HOSPITAL_COMMUNITY): Payer: Medicaid Other

## 2019-09-11 ENCOUNTER — Emergency Department (HOSPITAL_COMMUNITY)
Admission: EM | Admit: 2019-09-11 | Discharge: 2019-09-11 | Disposition: A | Discharge status: Home / Self Care | Payer: Medicaid Other | Attending: Emergency Medicine | Admitting: Emergency Medicine

## 2019-09-11 DIAGNOSIS — X58XXXD Exposure to other specified factors, subsequent encounter: Secondary | ICD-10-CM | POA: Insufficient documentation

## 2019-09-11 DIAGNOSIS — S39012A Strain of muscle, fascia and tendon of lower back, initial encounter: Secondary | ICD-10-CM | POA: Diagnosis not present

## 2019-09-11 DIAGNOSIS — S39012D Strain of muscle, fascia and tendon of lower back, subsequent encounter: Secondary | ICD-10-CM | POA: Diagnosis not present

## 2019-09-11 DIAGNOSIS — F17210 Nicotine dependence, cigarettes, uncomplicated: Secondary | ICD-10-CM | POA: Diagnosis not present

## 2019-09-11 DIAGNOSIS — Z79899 Other long term (current) drug therapy: Secondary | ICD-10-CM | POA: Insufficient documentation

## 2019-09-11 DIAGNOSIS — I10 Essential (primary) hypertension: Secondary | ICD-10-CM | POA: Diagnosis not present

## 2019-09-11 DIAGNOSIS — M545 Low back pain: Secondary | ICD-10-CM | POA: Diagnosis not present

## 2019-09-11 DIAGNOSIS — F17290 Nicotine dependence, other tobacco product, uncomplicated: Secondary | ICD-10-CM | POA: Insufficient documentation

## 2019-09-11 DIAGNOSIS — Z7982 Long term (current) use of aspirin: Secondary | ICD-10-CM | POA: Diagnosis not present

## 2019-09-11 LAB — URINALYSIS, ROUTINE W REFLEX MICROSCOPIC
Bilirubin Urine: NEGATIVE
Glucose, UA: NEGATIVE mg/dL
Hgb urine dipstick: NEGATIVE
Ketones, ur: NEGATIVE mg/dL
Leukocytes,Ua: NEGATIVE
Nitrite: NEGATIVE
Protein, ur: NEGATIVE mg/dL
Specific Gravity, Urine: 1.025 (ref 1.005–1.030)
pH: 6 (ref 5.0–8.0)

## 2019-09-11 LAB — PREGNANCY, URINE: Preg Test, Ur: NEGATIVE

## 2019-09-11 MED ORDER — CYCLOBENZAPRINE HCL 10 MG PO TABS
10.0000 mg | ORAL_TABLET | Freq: Every day | ORAL | 0 refills | Status: DC
Start: 2019-09-11 — End: 2019-12-22

## 2019-09-11 NOTE — Discharge Instructions (Addendum)
Per our discussion, I would recommend you continue taking a combination of ibuprofen and Tylenol for your pain.  I recommend 400 mg of ibuprofen combined with 325 mg of acetaminophen every 6-8 hours.  There is no need to take these medications if you are not in pain.  I am also going to prescribe you a short course of Flexeril.  You can take this once at night for management of your pain as well as difficulty sleeping.  Do not mix this with alcohol.  Do not take it before operating a motor vehicle.  I would also recommend applying Voltaren gel as well as ice and heat to your lower back.  Please discontinue your previously prescribed medications.  Please return to the emergency department with any new or worsening symptoms.  It was a pleasure to meet you.

## 2019-09-11 NOTE — ED Provider Notes (Signed)
[facility] DEPT Provider Note   CSN: [id number] Arrival date & time: 09/11/19  1525     History No chief complaint on file.   Julie Hendrix is a 34 y.o. female.  HPI HPI Comments: Julie Hendrix is a 34 y.o. female who presents to the Emergency Department complaining of lumbar back pain.  Patient states is been occurring for the last week and is worsening.  Her pain worsens with ambulation and radiates along the bilateral inguinal regions. She was seen 5 days ago by her PCP and prescribed meloxicam.  She then went to urgent care 4 days ago and was given tizanidine.  She denies relief with either.  She works as an Recruitment consultant.  She denies any recent heavy lifting.  She is sexually active with a female partner and does not use protection.  She reports associated increased urinary frequency.  She denies fevers, URI symptoms, chest pain, shortness of breath, nausea, vomiting, diarrhea, constipation, dysuria, hematuria, vaginal discharge, syncope.     Past Medical History:  Diagnosis Date  . Abnormal Pap smear   . 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 Dysplasia of cervix, low grade (CIN 1)   . HPV (human papilloma virus) anogenital infection     Patient Active Problem List   Diagnosis Date Noted  . Back pain 09/06/2019  . Abdominal cramping 08/09/2019  . Gastroesophageal reflux disease 06/06/2019  . Missed period 06/06/2019  . Smoker 02/18/2019  . Prediabetes 06/17/2018  . Mumps without complication 26/71/2458  . Essential hypertension 05/18/2018  . Morbid obesity with body mass index (BMI) of 45.0 to 49.9 in adult [facility]) 11/30/2017  . PCOS (polycystic ovarian syndrome) 11/30/2017  . Low grade squamous intraepithelial lesion on cytologic smear of cervix (LGSIL) 05/21/2017  . 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 Breast lump on right side at 10 o'clock  position 01/31/2013    Past Surgical History:  Procedure Laterality Date  . COLPOSCOPY    . CRYOTHERAPY  06/08/2017   cervix     OB History    Gravida  3   Para  2   Term  2   Preterm  0   AB  1   Living  2     SAB  0   TAB  1   Ectopic  0   Multiple  0   Live Births  2           Family History  Problem Relation Age of Onset  . Hypertension Father   . Diabetes Father   . Heart attack Father   . Hypertension Mother     Social History   Tobacco Use  . Smoking status: Current Every Day Smoker    Packs/day: 0.25    Types: Cigarettes, Cigars    Last attempt to quit: 11/22/2017    Years since quitting: 1.8  . Smokeless tobacco: Never Used  . Tobacco comment: black and mild  Substance Use Topics  . Alcohol use: Yes    Alcohol/week: 9.0 standard drinks    Types: 3 Glasses of wine, 3 Cans of beer, 3 Shots of liquor per week  . Drug use: No    Home Medications Prior to Admission medications   Medication Sig Start Date End Date Taking? Authorizing Provider  amLODipine (NORVASC) 10 MG tablet Take 1 tablet (10 mg total) by mouth daily. F/u for hypertension. 07/24/19  Yes  Allayne Stack, DO  aspirin EC 81 MG tablet Take 81 mg by mouth daily.   Yes [provider]  meloxicam (MOBIC) 7.5 MG tablet Take 1 tablet (7.5 mg total) by mouth daily. 09/06/19  Yes Oralia Manis, DO  Multiple Vitamin (MULTIVITAMIN WITH MINERALS) TABS tablet Take 1 tablet by mouth daily.   Yes [provider]  tiZANidine (ZANAFLEX) 4 MG tablet Take 1-2 tablets (4-8 mg total) by mouth every 6 (six) hours as needed for muscle spasms. Patient taking differently: Take 4 mg by mouth every 6 (six) hours as needed for muscle spasms.  09/07/19  Yes Eustace Moore, MD  triamcinolone cream (KENALOG) 0.1 % Apply 1 application topically 2 (two) times daily. Patient taking differently: Apply 1 application topically 2 (two) times daily as needed.  07/31/19  Yes Wieters, Hallie C,  PA-C  fluticasone (FLONASE) 50 MCG/ACT nasal spray Place 1 spray into both nostrils daily. 1 spray in each nostril every day Patient not taking: Reported on 06/06/2019 02/17/19   Allayne Stack, DO  dicyclomine (BENTYL) 10 MG capsule Take 1 capsule (10 mg total) by mouth 4 (four) times daily -  before meals and at bedtime. 08/09/19 09/07/19  Garnette Gunner, MD  omeprazole (PRILOSEC) 20 MG capsule Take 1 capsule (20 mg total) by mouth daily. 06/06/19 09/07/19  Garnette Gunner, MD    Allergies    Patient has no known allergies.  Review of Systems   Review of Systems  All other systems reviewed and are negative. Ten systems reviewed and are negative for acute change, except as noted in the HPI.    Physical Exam Updated Vital Signs BP (!) 153/93 (BP Location: Left Arm)   Pulse 89   Temp 98.1 F (36.7 C) (Oral)   Resp 16   LMP 08/27/2019   SpO2 94%   Physical Exam Vitals and nursing note reviewed.  Constitutional:      General: She is not in acute distress.    Appearance: Normal appearance. She is obese. She is not ill-appearing, toxic-appearing or diaphoretic.  HENT:     Head: Normocephalic and atraumatic.     Right Ear: External ear normal.     Left Ear: External ear normal.     Nose: Nose normal.     Mouth/Throat:     Pharynx: Oropharynx is clear.  Eyes:     Extraocular Movements: Extraocular movements intact.  Cardiovascular:     Rate and Rhythm: Normal rate and regular rhythm.     Pulses: Normal pulses.     Heart sounds: Normal heart sounds. No murmur. No friction rub. No gallop.   Pulmonary:     Effort: Pulmonary effort is normal. No respiratory distress.     Breath sounds: Normal breath sounds. No stridor. No wheezing, rhonchi or rales.  Abdominal:     General: Abdomen is flat.     Palpations: Abdomen is soft.     Tenderness: There is no abdominal tenderness.  Musculoskeletal:     Cervical back: Normal range of motion.     Comments: Mild midline lumbar pain.   Mild bilateral diffuse lumbar paraspinal tenderness.  Difficult to assess secondary to body habitus.  Skin:    General: Skin is warm and dry.     Capillary Refill: Capillary refill takes less than 2 seconds.  Neurological:     General: No focal deficit present.     Mental Status: She is alert and oriented to person, place, and time.  Psychiatric:        Mood and Affect: Mood normal.        Behavior: Behavior normal.    ED Results / Procedures / Treatments   Labs (all labs ordered are listed, but only abnormal results are displayed) Labs Reviewed  PREGNANCY, URINE  URINALYSIS, ROUTINE W REFLEX MICROSCOPIC   EKG None  Radiology DG Lumbar Spine Complete  Result Date: 09/11/2019 CLINICAL DATA:  Back pain EXAM: LUMBAR SPINE - COMPLETE 4+ VIEW COMPARISON:  10/24/2013 FINDINGS: There is no evidence of lumbar spine fracture. Alignment is normal. Intervertebral disc spaces are maintained. IMPRESSION: Negative. Electronically Signed   By: Jasmine Pang M.D.   On: 09/11/2019 18:09    Procedures Procedures   Medications Ordered in ED Medications - No data to display  ED Course  I have reviewed the triage vital signs and the nursing notes.  Pertinent labs & imaging results that were available during my care of the patient were reviewed by me and considered in my medical decision making (see chart for details).    MDM Rules/Calculators/A&P                      Patient is a 34 year old female who presents with low back pain.  She has been evaluated by her primary care provider as well as urgent care.  She has been taking meloxicam and tizanidine without relief.  Physical exam is generally reassuring but patient also complains of some midline lumbar pain as well as increased urinary frequency.  I obtained a UA as well as a pregnancy test.  Pregnancy test was negative.  UA was benign.  X-ray of the lumbar spine was benign as well.  I discussed these findings with the patient.  We  discussed the new regimen including Tylenol and ibuprofen.  We discussed proper dosing.  We are going to stop the tizanidine and patient was given a short course of Flexeril to use as needed at night.  She knows not to mix this with alcohol or drive a vehicle after taking it.  I recommended Voltaren gel as well as ice/heat.  Hot baths.  I gave her a work note for the next couple of days.  I recommended that she continue with range of motion exercises and stretching as tolerated.  She understands she can return to the emergency department with new or worsening symptoms.  Her questions were answered and she was amicable to time of discharge.  Her vital signs are stable.  Patient discharged to home/self care.  Condition at discharge: Stable  Note: Portions of this report may have been transcribed using voice recognition software. Every effort was made to ensure accuracy; however, inadvertent computerized transcription errors may be present.    Final Clinical Impression(s) / ED Diagnoses Final diagnoses:  Strain of lumbar region, subsequent encounter   Rx / DC Orders ED Discharge Orders         Ordered    cyclobenzaprine (FLEXERIL) 10 MG tablet  Daily at bedtime     09/11/19 1838           Placido Sou, PA-C 09/11/19 1849    Lorre Nick, MD 09/12/19 [phone]

## 2019-09-11 NOTE — ED Triage Notes (Signed)
Pt reports lower back pain that radiates to groin area. Pt reports going to UC a few days ago and she was prescribed a muscle relaxer. Pt endorses increased pain this morning.

## 2019-09-20 DIAGNOSIS — H47322 Drusen of optic disc, left eye: Secondary | ICD-10-CM | POA: Diagnosis not present

## 2019-09-20 DIAGNOSIS — H52223 Regular astigmatism, bilateral: Secondary | ICD-10-CM | POA: Diagnosis not present

## 2019-09-20 DIAGNOSIS — H5212 Myopia, left eye: Secondary | ICD-10-CM | POA: Diagnosis not present

## 2019-09-20 DIAGNOSIS — H35033 Hypertensive retinopathy, bilateral: Secondary | ICD-10-CM | POA: Diagnosis not present

## 2019-09-20 DIAGNOSIS — H31092 Other chorioretinal scars, left eye: Secondary | ICD-10-CM | POA: Diagnosis not present

## 2019-09-20 DIAGNOSIS — H43392 Other vitreous opacities, left eye: Secondary | ICD-10-CM | POA: Diagnosis not present

## 2019-09-28 DIAGNOSIS — M5386 Other specified dorsopathies, lumbar region: Secondary | ICD-10-CM | POA: Diagnosis not present

## 2019-09-28 DIAGNOSIS — M9903 Segmental and somatic dysfunction of lumbar region: Secondary | ICD-10-CM | POA: Diagnosis not present

## 2019-09-28 DIAGNOSIS — M9902 Segmental and somatic dysfunction of thoracic region: Secondary | ICD-10-CM | POA: Diagnosis not present

## 2019-09-28 DIAGNOSIS — M9904 Segmental and somatic dysfunction of sacral region: Secondary | ICD-10-CM | POA: Diagnosis not present

## 2019-09-28 DIAGNOSIS — M5137 Other intervertebral disc degeneration, lumbosacral region: Secondary | ICD-10-CM | POA: Diagnosis not present

## 2019-09-28 DIAGNOSIS — M5415 Radiculopathy, thoracolumbar region: Secondary | ICD-10-CM | POA: Diagnosis not present

## 2019-10-02 DIAGNOSIS — M5137 Other intervertebral disc degeneration, lumbosacral region: Secondary | ICD-10-CM | POA: Diagnosis not present

## 2019-10-02 DIAGNOSIS — M9902 Segmental and somatic dysfunction of thoracic region: Secondary | ICD-10-CM | POA: Diagnosis not present

## 2019-10-02 DIAGNOSIS — M5386 Other specified dorsopathies, lumbar region: Secondary | ICD-10-CM | POA: Diagnosis not present

## 2019-10-02 DIAGNOSIS — M5415 Radiculopathy, thoracolumbar region: Secondary | ICD-10-CM | POA: Diagnosis not present

## 2019-10-02 DIAGNOSIS — M9903 Segmental and somatic dysfunction of lumbar region: Secondary | ICD-10-CM | POA: Diagnosis not present

## 2019-10-02 DIAGNOSIS — M9904 Segmental and somatic dysfunction of sacral region: Secondary | ICD-10-CM | POA: Diagnosis not present

## 2019-10-04 DIAGNOSIS — G44209 Tension-type headache, unspecified, not intractable: Secondary | ICD-10-CM | POA: Diagnosis not present

## 2019-10-04 DIAGNOSIS — H40033 Anatomical narrow angle, bilateral: Secondary | ICD-10-CM | POA: Diagnosis not present

## 2019-10-05 DIAGNOSIS — M5137 Other intervertebral disc degeneration, lumbosacral region: Secondary | ICD-10-CM | POA: Diagnosis not present

## 2019-10-05 DIAGNOSIS — M5415 Radiculopathy, thoracolumbar region: Secondary | ICD-10-CM | POA: Diagnosis not present

## 2019-10-05 DIAGNOSIS — M9904 Segmental and somatic dysfunction of sacral region: Secondary | ICD-10-CM | POA: Diagnosis not present

## 2019-10-05 DIAGNOSIS — M5386 Other specified dorsopathies, lumbar region: Secondary | ICD-10-CM | POA: Diagnosis not present

## 2019-10-05 DIAGNOSIS — M9902 Segmental and somatic dysfunction of thoracic region: Secondary | ICD-10-CM | POA: Diagnosis not present

## 2019-10-05 DIAGNOSIS — M9903 Segmental and somatic dysfunction of lumbar region: Secondary | ICD-10-CM | POA: Diagnosis not present

## 2019-10-06 DIAGNOSIS — M9902 Segmental and somatic dysfunction of thoracic region: Secondary | ICD-10-CM | POA: Diagnosis not present

## 2019-10-06 DIAGNOSIS — M9904 Segmental and somatic dysfunction of sacral region: Secondary | ICD-10-CM | POA: Diagnosis not present

## 2019-10-06 DIAGNOSIS — M9903 Segmental and somatic dysfunction of lumbar region: Secondary | ICD-10-CM | POA: Diagnosis not present

## 2019-10-06 DIAGNOSIS — M5137 Other intervertebral disc degeneration, lumbosacral region: Secondary | ICD-10-CM | POA: Diagnosis not present

## 2019-10-06 DIAGNOSIS — M5415 Radiculopathy, thoracolumbar region: Secondary | ICD-10-CM | POA: Diagnosis not present

## 2019-10-06 DIAGNOSIS — M5386 Other specified dorsopathies, lumbar region: Secondary | ICD-10-CM | POA: Diagnosis not present

## 2019-10-08 DIAGNOSIS — H5213 Myopia, bilateral: Secondary | ICD-10-CM | POA: Diagnosis not present

## 2019-10-09 DIAGNOSIS — M9903 Segmental and somatic dysfunction of lumbar region: Secondary | ICD-10-CM | POA: Diagnosis not present

## 2019-10-09 DIAGNOSIS — M5137 Other intervertebral disc degeneration, lumbosacral region: Secondary | ICD-10-CM | POA: Diagnosis not present

## 2019-10-09 DIAGNOSIS — H5213 Myopia, bilateral: Secondary | ICD-10-CM | POA: Diagnosis not present

## 2019-10-09 DIAGNOSIS — M9902 Segmental and somatic dysfunction of thoracic region: Secondary | ICD-10-CM | POA: Diagnosis not present

## 2019-10-09 DIAGNOSIS — M5415 Radiculopathy, thoracolumbar region: Secondary | ICD-10-CM | POA: Diagnosis not present

## 2019-10-09 DIAGNOSIS — M5386 Other specified dorsopathies, lumbar region: Secondary | ICD-10-CM | POA: Diagnosis not present

## 2019-10-09 DIAGNOSIS — M9904 Segmental and somatic dysfunction of sacral region: Secondary | ICD-10-CM | POA: Diagnosis not present

## 2019-10-11 ENCOUNTER — Ambulatory Visit (INDEPENDENT_AMBULATORY_CARE_PROVIDER_SITE_OTHER): Payer: Medicaid Other | Admitting: General Practice

## 2019-10-11 ENCOUNTER — Other Ambulatory Visit (HOSPITAL_COMMUNITY)
Admission: RE | Admit: 2019-10-11 | Discharge: 2019-10-11 | Disposition: A | Discharge status: Home / Self Care | Payer: Medicaid Other | Source: Ambulatory Visit | Attending: Family Medicine | Admitting: Family Medicine

## 2019-10-11 ENCOUNTER — Other Ambulatory Visit: Payer: Self-pay

## 2019-10-11 DIAGNOSIS — N898 Other specified noninflammatory disorders of vagina: Secondary | ICD-10-CM

## 2019-10-11 NOTE — Progress Notes (Addendum)
Patient presents to office today reporting vaginal itching, & a white, thin discharge with slight odor. She reports LMP 6/14- 6/20 & has had symptoms since then. Instructed patient in self swab & specimen collected. Discussed results take approximately 24-48 hours and will be available in mychart & any needed prescriptions will be sent in. Patient verbalized understanding.  Chase Caller RN BSN 10/11/19

## 2019-10-12 DIAGNOSIS — M9903 Segmental and somatic dysfunction of lumbar region: Secondary | ICD-10-CM | POA: Diagnosis not present

## 2019-10-12 DIAGNOSIS — M5415 Radiculopathy, thoracolumbar region: Secondary | ICD-10-CM | POA: Diagnosis not present

## 2019-10-12 DIAGNOSIS — M5386 Other specified dorsopathies, lumbar region: Secondary | ICD-10-CM | POA: Diagnosis not present

## 2019-10-12 DIAGNOSIS — M9904 Segmental and somatic dysfunction of sacral region: Secondary | ICD-10-CM | POA: Diagnosis not present

## 2019-10-12 DIAGNOSIS — M5137 Other intervertebral disc degeneration, lumbosacral region: Secondary | ICD-10-CM | POA: Diagnosis not present

## 2019-10-12 DIAGNOSIS — M9902 Segmental and somatic dysfunction of thoracic region: Secondary | ICD-10-CM | POA: Diagnosis not present

## 2019-10-12 LAB — CERVICOVAGINAL ANCILLARY ONLY
Bacterial Vaginitis (gardnerella): POSITIVE — AB
Candida Glabrata: NEGATIVE
Candida Vaginitis: NEGATIVE
Chlamydia: NEGATIVE
Comment: NEGATIVE
Comment: NEGATIVE
Comment: NEGATIVE
Comment: NEGATIVE
Comment: NEGATIVE
Comment: NORMAL
Neisseria Gonorrhea: NEGATIVE
Trichomonas: NEGATIVE

## 2019-10-13 ENCOUNTER — Other Ambulatory Visit: Payer: Self-pay | Admitting: Obstetrics and Gynecology

## 2019-10-13 MED ORDER — METRONIDAZOLE 500 MG PO TABS: 500.0000 mg | ORAL_TABLET | Freq: Two times a day (BID) | ORAL | 0 refills | Status: DC

## 2019-10-16 DIAGNOSIS — M5386 Other specified dorsopathies, lumbar region: Secondary | ICD-10-CM | POA: Diagnosis not present

## 2019-10-16 DIAGNOSIS — M9902 Segmental and somatic dysfunction of thoracic region: Secondary | ICD-10-CM | POA: Diagnosis not present

## 2019-10-16 DIAGNOSIS — M9903 Segmental and somatic dysfunction of lumbar region: Secondary | ICD-10-CM | POA: Diagnosis not present

## 2019-10-16 DIAGNOSIS — M9904 Segmental and somatic dysfunction of sacral region: Secondary | ICD-10-CM | POA: Diagnosis not present

## 2019-10-16 DIAGNOSIS — M5415 Radiculopathy, thoracolumbar region: Secondary | ICD-10-CM | POA: Diagnosis not present

## 2019-10-16 DIAGNOSIS — M5137 Other intervertebral disc degeneration, lumbosacral region: Secondary | ICD-10-CM | POA: Diagnosis not present

## 2019-10-17 ENCOUNTER — Other Ambulatory Visit: Payer: Self-pay

## 2019-10-17 DIAGNOSIS — I10 Essential (primary) hypertension: Secondary | ICD-10-CM

## 2019-10-17 MED ORDER — AMLODIPINE BESYLATE 10 MG PO TABS: 10.0000 mg | ORAL_TABLET | Freq: Every day | ORAL | 2 refills | Status: DC

## 2019-12-22 ENCOUNTER — Other Ambulatory Visit: Payer: Self-pay

## 2019-12-22 ENCOUNTER — Other Ambulatory Visit (HOSPITAL_COMMUNITY)
Admission: RE | Admit: 2019-12-22 | Discharge: 2019-12-22 | Disposition: A | Discharge status: Home / Self Care | Payer: Medicaid Other | Source: Ambulatory Visit | Attending: Obstetrics and Gynecology | Admitting: Obstetrics and Gynecology

## 2019-12-22 ENCOUNTER — Ambulatory Visit (INDEPENDENT_AMBULATORY_CARE_PROVIDER_SITE_OTHER): Payer: Medicaid Other

## 2019-12-22 VITALS — BP 138/92 | HR 86

## 2019-12-22 DIAGNOSIS — N898 Other specified noninflammatory disorders of vagina: Secondary | ICD-10-CM

## 2019-12-22 DIAGNOSIS — Z20822 Contact with and (suspected) exposure to covid-19: Secondary | ICD-10-CM | POA: Diagnosis not present

## 2019-12-22 NOTE — Progress Notes (Signed)
Here today for vaginal swab. Pt reports experiencing watery vaginal discharge with a foul odor that resolved after beginning of period on 12/18/19. Pt still desires self-swab. Instructions given and specimen obtained. Explained pt will be called with any abnormal results, encouraged pt to also check MyChart. Pt desires metronidazole gel if positive for BV, unable to tolerate Flagyl pills.   Reviewed good hygiene practices and BV prevention with pt. Encouraged pt to schedule appt with provider to discuss recurring BV if this becomes unmanageable. Pt also has some concern for STD; encouraged safe sex practices with all partners and explained we will also test for STDs on vaginal swab.  Fleet Contras RN 12/22/19

## 2019-12-22 NOTE — Progress Notes (Signed)
Patient was assessed and managed by nursing staff during this encounter. I have reviewed the chart and agree with the documentation and plan.  Vonzella Nipple, PA-C 12/22/2019 12:00 PM

## 2019-12-26 ENCOUNTER — Other Ambulatory Visit: Payer: Self-pay | Admitting: Medical

## 2019-12-26 DIAGNOSIS — N760 Acute vaginitis: Secondary | ICD-10-CM

## 2019-12-26 LAB — CERVICOVAGINAL ANCILLARY ONLY
Bacterial Vaginitis (gardnerella): POSITIVE — AB
Candida Glabrata: NEGATIVE
Candida Vaginitis: NEGATIVE
Chlamydia: NEGATIVE
Comment: NEGATIVE
Comment: NEGATIVE
Comment: NEGATIVE
Comment: NEGATIVE
Comment: NEGATIVE
Comment: NORMAL
Neisseria Gonorrhea: NEGATIVE
Trichomonas: NEGATIVE

## 2019-12-26 MED ORDER — METRONIDAZOLE 0.75 % VA GEL: 1.0000 | Freq: Two times a day (BID) | VAGINAL | 0 refills | Status: DC

## 2020-01-11 DIAGNOSIS — Z20822 Contact with and (suspected) exposure to covid-19: Secondary | ICD-10-CM | POA: Diagnosis not present

## 2020-01-23 ENCOUNTER — Ambulatory Visit: Payer: Medicaid Other | Admitting: Nurse Practitioner

## 2020-01-27 ENCOUNTER — Encounter (HOSPITAL_COMMUNITY): Payer: Self-pay

## 2020-01-27 ENCOUNTER — Other Ambulatory Visit: Payer: Self-pay

## 2020-01-27 ENCOUNTER — Ambulatory Visit (HOSPITAL_COMMUNITY)
Admission: EM | Admit: 2020-01-27 | Discharge: 2020-01-27 | Disposition: A | Discharge status: Home / Self Care | Payer: Medicaid Other | Attending: Family Medicine | Admitting: Family Medicine

## 2020-01-27 DIAGNOSIS — I10 Essential (primary) hypertension: Secondary | ICD-10-CM | POA: Insufficient documentation

## 2020-01-27 DIAGNOSIS — K219 Gastro-esophageal reflux disease without esophagitis: Secondary | ICD-10-CM | POA: Diagnosis not present

## 2020-01-27 DIAGNOSIS — J069 Acute upper respiratory infection, unspecified: Secondary | ICD-10-CM | POA: Diagnosis not present

## 2020-01-27 DIAGNOSIS — Z20822 Contact with and (suspected) exposure to covid-19: Secondary | ICD-10-CM | POA: Insufficient documentation

## 2020-01-27 DIAGNOSIS — F17210 Nicotine dependence, cigarettes, uncomplicated: Secondary | ICD-10-CM | POA: Insufficient documentation

## 2020-01-27 DIAGNOSIS — Z79899 Other long term (current) drug therapy: Secondary | ICD-10-CM | POA: Insufficient documentation

## 2020-01-27 DIAGNOSIS — Z6841 Body Mass Index (BMI) 40.0 and over, adult: Secondary | ICD-10-CM | POA: Insufficient documentation

## 2020-01-27 DIAGNOSIS — L0100 Impetigo, unspecified: Secondary | ICD-10-CM | POA: Insufficient documentation

## 2020-01-27 DIAGNOSIS — R7303 Prediabetes: Secondary | ICD-10-CM | POA: Insufficient documentation

## 2020-01-27 DIAGNOSIS — R059 Cough, unspecified: Secondary | ICD-10-CM | POA: Diagnosis present

## 2020-01-27 DIAGNOSIS — E282 Polycystic ovarian syndrome: Secondary | ICD-10-CM | POA: Insufficient documentation

## 2020-01-27 DIAGNOSIS — Z7982 Long term (current) use of aspirin: Secondary | ICD-10-CM | POA: Insufficient documentation

## 2020-01-27 MED ORDER — AZITHROMYCIN 250 MG PO TABS
ORAL_TABLET | ORAL | 0 refills | Status: DC
Start: 2020-01-27 — End: 2020-02-13

## 2020-01-27 MED ORDER — PROMETHAZINE-DM 6.25-15 MG/5ML PO SYRP
5.0000 mL | ORAL_SOLUTION | Freq: Four times a day (QID) | ORAL | 0 refills | Status: DC | PRN
Start: 2020-01-27 — End: 2020-02-13

## 2020-01-27 MED ORDER — ALBUTEROL SULFATE HFA 108 (90 BASE) MCG/ACT IN AERS: 1.0000 | INHALATION_SPRAY | Freq: Four times a day (QID) | RESPIRATORY_TRACT | 0 refills | Status: DC | PRN

## 2020-01-27 NOTE — ED Triage Notes (Signed)
Pt presents with cough and nasal congestion x 1 week. Reports having a sore in the nose x 4 days. Denies fever, sob or any other symptoms.

## 2020-01-27 NOTE — ED Provider Notes (Signed)
[facility]    CSN: [id number] Arrival date & time: 01/27/20  1053      History   Chief Complaint Chief Complaint  Patient presents with  . Cough  . Nasal Congestion  . Sore    HPI Julie Hendrix is a 34 y.o. female.   Presenting today with over a week of congestion, productive cough of thick phlegm, wheezing here and there with some chest tightness. Now also has a large sore area under nose on right side that she's very worried about. Taking mucinex and "sinus pills" which have helped sxs some and just started applying neosporin to the nose area yesterday. Denies known fever, chills, CP, SOB, N/V/D. No known sick contacts. Does smoke cigarettes, no known pulmonary dz.      Past Medical History:  Diagnosis Date  . Abnormal Pap smear   . 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 Dysplasia of cervix, low grade (CIN 1)   . HPV (human papilloma virus) anogenital infection     Patient Active Problem List   Diagnosis Date Noted  . Back pain 09/06/2019  . Abdominal cramping 08/09/2019  . Gastroesophageal reflux disease 06/06/2019  . Missed period 06/06/2019  . Smoker 02/18/2019  . Prediabetes 06/17/2018  . Mumps without complication 05/18/2018  . Essential hypertension 05/18/2018  . Morbid obesity with body mass index (BMI) of 45.0 to 49.9 in adult [facility]) 11/30/2017  . PCOS (polycystic ovarian syndrome) 11/30/2017  . Low grade squamous intraepithelial lesion on cytologic smear of cervix (LGSIL) 05/21/2017  . 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 Breast lump on right side at 10 o'clock position 01/31/2013    Past Surgical History:  Procedure Laterality Date  . COLPOSCOPY    . CRYOTHERAPY  06/08/2017   cervix    OB History    Gravida  3   Para  2   Term  2   Preterm  0   AB  1   Living  2     SAB  0   TAB  1   Ectopic  0   Multiple  0   Live Births  2             Home Medications    Prior to Admission medications   Medication Sig Start Date End Date Taking? Authorizing Provider  albuterol (VENTOLIN HFA) 108 (90 Base) MCG/ACT inhaler Inhale 1-2 puffs into the lungs every 6 (six) hours as needed for wheezing or shortness of breath. 01/27/20   Particia Nearing, PA-C  amLODipine (NORVASC) 10 MG tablet Take 1 tablet (10 mg total) by mouth daily. 10/17/19   Allayne Stack, DO  aspirin EC 81 MG tablet Take 81 mg by mouth daily.    [provider]  azithromycin (ZITHROMAX) 250 MG tablet Take 2 tabs day one, then 1 tab daily until complete 01/27/20   Particia Nearing, PA-C  metroNIDAZOLE (METROGEL VAGINAL) 0.75 % vaginal gel Place 1 Applicatorful vaginally 2 (two) times daily. 12/26/19   Marny Lowenstein, PA-C  Multiple Vitamin (MULTIVITAMIN WITH MINERALS) TABS tablet Take 1 tablet by mouth daily.    [provider]  promethazine-dextromethorphan (PROMETHAZINE-DM) 6.25-15 MG/5ML syrup Take 5 mLs by mouth 4 (four) times daily as needed for cough. 01/27/20   Particia Nearing, PA-C  triamcinolone cream (KENALOG) 0.1 % Apply 1 application topically 2 (two) times daily. 07/31/19   Wieters, Junius Creamer, PA-C  dicyclomine (BENTYL) 10 MG capsule Take 1 capsule (10 mg total) by mouth 4 (four) times daily -  before meals and at bedtime. 08/09/19 09/07/19  Garnette Gunner, MD  omeprazole (PRILOSEC) 20 MG capsule Take 1 capsule (20 mg total) by mouth daily. 06/06/19 09/07/19  Garnette Gunner, MD    Family History Family History  Problem Relation Age of Onset  . Hypertension Father   . Diabetes Father   . Heart attack Father   . Hypertension Mother     Social History Social History   Tobacco Use  . Smoking status: Current Every Day Smoker    Packs/day: 0.25    Types: Cigarettes, Cigars    Last attempt to quit: 11/22/2017    Years since quitting: 2.1  . Smokeless tobacco: Never Used  . Tobacco comment: black and mild  Vaping Use   . Vaping Use: Never used  Substance Use Topics  . Alcohol use: Yes    Alcohol/week: 9.0 standard drinks    Types: 3 Glasses of wine, 3 Cans of beer, 3 Shots of liquor per week  . Drug use: No     Allergies   Patient has no known allergies.   Review of Systems Review of Systems PER HPI    Physical Exam Triage Vital Signs ED Triage Vitals  Enc Vitals Group     BP 01/27/20 1203 138/85     Pulse Rate 01/27/20 1203 72     Resp 01/27/20 1203 18     Temp 01/27/20 1203 98 F (36.7 C)     Temp Source 01/27/20 1203 Oral     SpO2 01/27/20 1203 100 %     Weight --      Height --      Head Circumference --      Peak Flow --      Pain Score 01/27/20 1202 4     Pain Loc --      Pain Edu? --      Excl. in GC? --    No data found.  Updated Vital Signs BP 138/85 (BP Location: Right Arm)   Pulse 72   Temp 98 F (36.7 C) (Oral)   Resp 18   LMP 01/26/2020 (Exact Date)   SpO2 100%   Visual Acuity Right Eye Distance:   Left Eye Distance:   Bilateral Distance:    Right Eye Near:   Left Eye Near:    Bilateral Near:     Physical Exam Vitals and nursing note reviewed.  Constitutional:      Appearance: Normal appearance. She is not ill-appearing.  HENT:     Head: Atraumatic.     Nose: Congestion present.     Mouth/Throat:     Mouth: Mucous membranes are moist.     Pharynx: Posterior oropharyngeal erythema present.  Eyes:     Extraocular Movements: Extraocular movements intact.     Conjunctiva/sclera: Conjunctivae normal.  Cardiovascular:     Rate and Rhythm: Normal rate and regular rhythm.     Heart sounds: Normal heart sounds.  Pulmonary:     Effort: Pulmonary effort is normal. No respiratory distress.     Breath sounds: Wheezing (right greater than left) present. No rhonchi or rales.  Musculoskeletal:        General: Normal range of motion.     Cervical back: Normal range of motion and neck supple.  Skin:    General: Skin is warm.     Findings: Rash (golden  crusted patch on erythematous base under right nare, about 1.5 - 2 cm diameter) present.  Neurological:     Mental Status: She is alert and oriented to person, place, and time.  Psychiatric:        Mood and Affect: Mood normal.        Thought Content: Thought content normal.        Judgment: Judgment normal.    UC Treatments / Results  Labs (all labs ordered are listed, but only abnormal results are displayed) Labs Reviewed  SARS CORONAVIRUS 2 (TAT 6-24 HRS)    EKG   Radiology No results found.  Procedures Procedures (including critical care time)  Medications Ordered in UC Medications - No data to display  Initial Impression / Assessment and Plan / UC Course  I have reviewed the triage vital signs and the nursing notes.  Pertinent labs & imaging results that were available during my care of the patient were reviewed by me and considered in my medical decision making (see chart for details).     Will tx with zpak, albuterol and phenergam DM to cover both her skin infection and cover for bacterial respiratory infection. Discussed proper use and indication for her albuterol inhaler. COVID pcr pending, note given and isolation protocol reviewed. Return precautions discussed at length.   Final Clinical Impressions(s) / UC Diagnoses   Final diagnoses:  Impetigo  Upper respiratory tract infection, unspecified type   Discharge Instructions   None    ED Prescriptions    Medication Sig Dispense Auth. Provider   azithromycin (ZITHROMAX) 250 MG tablet Take 2 tabs day one, then 1 tab daily until complete 6 tablet Particia Nearing, PA-C   promethazine-dextromethorphan (PROMETHAZINE-DM) 6.25-15 MG/5ML syrup Take 5 mLs by mouth 4 (four) times daily as needed for cough. 100 mL Particia Nearing, PA-C   albuterol (VENTOLIN HFA) 108 (90 Base) MCG/ACT inhaler Inhale 1-2 puffs into the lungs every 6 (six) hours as needed for wheezing or shortness of breath. 18 g Particia Nearing, [place]     PDMP not reviewed this encounter.   Particia Nearing, [place] 01/27/20 1300

## 2020-01-28 LAB — SARS CORONAVIRUS 2 (TAT 6-24 HRS): SARS Coronavirus 2: NEGATIVE

## 2020-02-08 ENCOUNTER — Other Ambulatory Visit: Payer: Self-pay

## 2020-02-08 ENCOUNTER — Ambulatory Visit (HOSPITAL_COMMUNITY)
Admission: EM | Admit: 2020-02-08 | Discharge: 2020-02-08 | Disposition: A | Discharge status: Home / Self Care | Payer: Medicaid Other | Attending: Family Medicine | Admitting: Family Medicine

## 2020-02-08 ENCOUNTER — Encounter (HOSPITAL_COMMUNITY): Payer: Self-pay

## 2020-02-08 DIAGNOSIS — S76012A Strain of muscle, fascia and tendon of left hip, initial encounter: Secondary | ICD-10-CM

## 2020-02-08 NOTE — ED Provider Notes (Signed)
[facility]    CSN: [id number] Arrival date & time: 02/08/20  1811      History   Chief Complaint Chief Complaint  Patient presents with  . Hip Pain  . Back Pain    HPI Julie Hendrix is a 34 y.o. female.   She is presenting with left hip pain.  Has been ongoing for a few days.  She feels the pain over the left iliac crest.  No injury or inciting event.  Seems to be staying the same.  No history of similar pain.  No history of surgery.  HPI  Past Medical History:  Diagnosis Date  . Abnormal Pap smear   . 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 Dysplasia of cervix, low grade (CIN 1)   . HPV (human papilloma virus) anogenital infection     Patient Active Problem List   Diagnosis Date Noted  . Back pain 09/06/2019  . Abdominal cramping 08/09/2019  . Gastroesophageal reflux disease 06/06/2019  . Missed period 06/06/2019  . Smoker 02/18/2019  . Prediabetes 06/17/2018  . Mumps without complication 05/18/2018  . Essential hypertension 05/18/2018  . Morbid obesity with body mass index (BMI) of 45.0 to 49.9 in adult [facility]) 11/30/2017  . PCOS (polycystic ovarian syndrome) 11/30/2017  . Low grade squamous intraepithelial lesion on cytologic smear of cervix (LGSIL) 05/21/2017  . 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 Breast lump on right side at 10 o'clock position 01/31/2013    Past Surgical History:  Procedure Laterality Date  . COLPOSCOPY    . CRYOTHERAPY  06/08/2017   cervix    OB History    Gravida  3   Para  2   Term  2   Preterm  0   AB  1   Living  2     SAB  0   TAB  1   Ectopic  0   Multiple  0   Live Births  2            Home Medications    Prior to Admission medications   Medication Sig Start Date End Date Taking? Authorizing Provider  albuterol (VENTOLIN HFA) 108 (90 Base) MCG/ACT inhaler Inhale 1-2 puffs into the lungs every 6 (six) hours as  needed for wheezing or shortness of breath. 01/27/20   Particia Nearing, PA-C  amLODipine (NORVASC) 10 MG tablet Take 1 tablet (10 mg total) by mouth daily. 10/17/19   Allayne Stack, DO  aspirin EC 81 MG tablet Take 81 mg by mouth daily.    [provider]  azithromycin (ZITHROMAX) 250 MG tablet Take 2 tabs day one, then 1 tab daily until complete 01/27/20   Particia Nearing, PA-C  metroNIDAZOLE (METROGEL VAGINAL) 0.75 % vaginal gel Place 1 Applicatorful vaginally 2 (two) times daily. 12/26/19   Marny Lowenstein, PA-C  Multiple Vitamin (MULTIVITAMIN WITH MINERALS) TABS tablet Take 1 tablet by mouth daily.    [provider]  promethazine-dextromethorphan (PROMETHAZINE-DM) 6.25-15 MG/5ML syrup Take 5 mLs by mouth 4 (four) times daily as needed for cough. 01/27/20   Particia Nearing, PA-C  triamcinolone cream (KENALOG) 0.1 % Apply 1 application topically 2 (two) times daily. 07/31/19   Wieters, Hallie C, PA-C  dicyclomine (BENTYL) 10 MG capsule Take 1 capsule (10 mg total) by mouth 4 (four) times daily -  before meals and at bedtime. 08/09/19 09/07/19  Garnette Gunner, MD  omeprazole (PRILOSEC) 20 MG capsule Take 1 capsule (20 mg total) by mouth daily. 06/06/19 09/07/19  Garnette Gunner, MD    Family History Family History  Problem Relation Age of Onset  . Hypertension Father   . Diabetes Father   . Heart attack Father   . Hypertension Mother     Social History Social History   Tobacco Use  . Smoking status: Current Every Day Smoker    Packs/day: 0.25    Types: Cigarettes, Cigars    Last attempt to quit: 11/22/2017    Years since quitting: 2.2  . Smokeless tobacco: Never Used  . Tobacco comment: black and mild  Vaping Use  . Vaping Use: Never used  Substance Use Topics  . Alcohol use: Yes    Alcohol/week: 9.0 standard drinks    Types: 3 Glasses of wine, 3 Cans of beer, 3 Shots of liquor per week  . Drug use: No     Allergies   Patient has no  known allergies.   Review of Systems Review of Systems  See HPI  Physical Exam Triage Vital Signs ED Triage Vitals [02/08/20 1847]  Enc Vitals Group     BP 138/84     Pulse Rate (!) 107     Resp 18     Temp 98.9 F (37.2 C)     Temp Source Oral     SpO2 97 %     Weight      Height      Head Circumference      Peak Flow      Pain Score 7     Pain Loc      Pain Edu?      Excl. in GC?    No data found.  Updated Vital Signs BP 138/84 (BP Location: Right Arm)   Pulse (!) 107   Temp 98.9 F (37.2 C) (Oral)   Resp 18   LMP 01/26/2020 (Exact Date)   SpO2 97%   Visual Acuity Right Eye Distance:   Left Eye Distance:   Bilateral Distance:    Right Eye Near:   Left Eye Near:    Bilateral Near:     Physical Exam Gen: NAD, alert, cooperative with exam, well-appearing ENT: normal lips, normal nasal mucosa,  Eye: normal EOM, normal conjunctiva and lids  Skin: no rashes, no areas of induration  Neuro: normal tone, normal sensation to touch Psych:  normal insight, alert and oriented MSK:  Left hip:  No tenderness over the greater trochanter. No tenderness palpation of the ASIS. Some tenderness at the origin of the gluteus medius at iliac crest. Neurovascularly intact   UC Treatments / Results  Labs (all labs ordered are listed, but only abnormal results are displayed) Labs Reviewed - No data to display  EKG   Radiology No results found.  Procedures Procedures (including critical care time)  Medications Ordered in UC Medications - No data to display  Initial Impression / Assessment and Plan / UC Course  I have reviewed the triage vital signs and the nursing notes.  Pertinent labs & imaging results that were available during my care of the patient were reviewed by me and considered in my medical decision making (see chart for details).     Julie Hendrix is a 34 year old female that is presenting with left gluteal strain.  Counseled on supportive care.   Counseled on home exercise therapy.  Given indications on follow-up.  Final Clinical Impressions(s) / UC Diagnoses  Final diagnoses:  Muscle strain of left gluteal region, initial encounter     Discharge Instructions     Please try heat and/or ice  Please try ibuprofen if needed  Please follow up if your symptoms fail to improve.     ED Prescriptions    None     PDMP not reviewed this encounter.   Myra Rude, MD 02/08/20 [phone]

## 2020-02-08 NOTE — ED Triage Notes (Signed)
Pt present left side hip pain that moving toward her lower back area. Symptoms started 3 days ago. Pt states certain  Movements hurts the area.

## 2020-02-08 NOTE — Discharge Instructions (Signed)
Please try heat and/or ice  Please try ibuprofen if needed  Please follow up if your symptoms fail to improve.

## 2020-02-13 ENCOUNTER — Other Ambulatory Visit: Payer: Self-pay

## 2020-02-13 ENCOUNTER — Other Ambulatory Visit (HOSPITAL_COMMUNITY)
Admission: RE | Admit: 2020-02-13 | Discharge: 2020-02-13 | Disposition: A | Discharge status: Home / Self Care | Payer: Medicaid Other | Source: Ambulatory Visit | Attending: Family Medicine | Admitting: Family Medicine

## 2020-02-13 ENCOUNTER — Ambulatory Visit (INDEPENDENT_AMBULATORY_CARE_PROVIDER_SITE_OTHER): Payer: Medicaid Other

## 2020-02-13 VITALS — BP 143/87 | HR 83 | Wt 267.3 lb

## 2020-02-13 DIAGNOSIS — N898 Other specified noninflammatory disorders of vagina: Secondary | ICD-10-CM | POA: Insufficient documentation

## 2020-02-13 MED ORDER — FLUCONAZOLE 150 MG PO TABS: 150.0000 mg | ORAL_TABLET | Freq: Once | ORAL | 0 refills | Status: AC

## 2020-02-13 NOTE — Patient Instructions (Signed)
Natural Remedies for Bacterial Vaginosis   Option #1  1 Tbsp Fractitionated Coconut Oil  10 drops of Melaleuca (Tea Tree) Oil   Mix ingredients together well.  Soak 3-4 tampons (in applicators) in that mixture until all or mostly all mixture is soaked up into the tampons.  Insert 1 saturated tampon vaginally and wear overnight for 3-4 nights.     Option #2 (sometimes to be used in conjunction with option #1)  Fill tub with enough to cover lap/lower abdomen warm water.  Mix 1/2 cup of baking soda in water.  Soak in water/baking soda mixture for at least 20 minutes.  Be sure to swish water in between legs to get as much in vagina as possible.  This soak should be done after sexual intercourse and menstrual cycles.     GO WHITE:  Soap: UNSCENTED Dove (white box light green writing)  Laundry detergent (underwear)- Dreft or Arm n[employer] Hammer unscented  WHITE 100% cotton panties (NOT just cotton crouch)  Sanitary napkin/panty liners: UNSCENTED.  If it doesn't SAY unscented it can have a scent/perfume    NO PERFUMES OR LOTIONS OR POTIONS in the vulvar area (may use regular KY)  Condoms: hypoallergenic only. Non dyed (no color)  Toilet papers: white only  Wash clothes: use a separate wash cloth. WHITE.  Wash in Dreft.   You can purchase Tea Tree Oil locally at:   [employer]  [address]  [place] [address]   [employer]  [address]  [place] [address]   Advise that these alternatives will not replace the need to be evaluated if symptoms persist. You will need to seek care at an OB/GYN provider.   

## 2020-02-13 NOTE — Progress Notes (Signed)
Pt here today with complaint of vaginal itching with cottage cheese appearing discharge. Pt reports recently completing two antibiotics (metrogel and azithromycin); explained this can be a cause of yeast infection. Pt reports applying one dose of metrogel last night and taking one dose of Diflucan yesterday. I explained to pt that we do not recommend taking antibiotics without consulting provider to be sure this is recommended and medication is safe to take. Explained treatment for yeast infection is Diflucan 150 mg once. Instructed pt to monitor symptoms until Friday (4 days from admin of Diflucan), if symptoms persist, pt should pick up current Diflucan rx from pharmacy. Self swab instructions given and specimen obtained. Pt requests STD screening be added to vaginal swab. Explained pt will be contacted with any abnormal results.   Per chart review pt has recent hx of recurrent BV. Pt has been counseled prior by nursing staff to schedule a provider appt to discuss further. Follow up with pt who states she would like to speak with a provider and will schedule before leaving today. Pt also reports trying to get pregnant for the past 5 years. Explains she has a hx of abnormal periods. Encouraged pt to also bring up this concern at provider visit.   Fleet Contras RN  02/13/20

## 2020-02-14 LAB — CERVICOVAGINAL ANCILLARY ONLY
Bacterial Vaginitis (gardnerella): POSITIVE — AB
Candida Glabrata: NEGATIVE
Candida Vaginitis: POSITIVE — AB
Chlamydia: NEGATIVE
Comment: NEGATIVE
Comment: NEGATIVE
Comment: NEGATIVE
Comment: NEGATIVE
Comment: NEGATIVE
Comment: NORMAL
Neisseria Gonorrhea: NEGATIVE
Trichomonas: NEGATIVE

## 2020-02-15 ENCOUNTER — Other Ambulatory Visit: Payer: Self-pay | Admitting: Student

## 2020-02-15 MED ORDER — METRONIDAZOLE 500 MG PO TABS
500.0000 mg | ORAL_TABLET | Freq: Two times a day (BID) | ORAL | 0 refills | Status: DC
Start: 2020-02-15 — End: 2020-02-26

## 2020-02-15 MED ORDER — FLUCONAZOLE 150 MG PO TABS: ORAL_TABLET | ORAL | 1 refills | Status: DC

## 2020-02-19 NOTE — Progress Notes (Signed)
Chart reviewed for nurse visit. Agree with plan of care.   Aesha Agrawal Lorraine, CNM 02/19/2020 1:16 PM   

## 2020-02-20 ENCOUNTER — Ambulatory Visit (HOSPITAL_COMMUNITY)
Admission: EM | Admit: 2020-02-20 | Discharge: 2020-02-20 | Disposition: A | Discharge status: Home / Self Care | Payer: Medicaid Other | Attending: Family Medicine | Admitting: Family Medicine

## 2020-02-20 ENCOUNTER — Other Ambulatory Visit: Payer: Self-pay

## 2020-02-20 ENCOUNTER — Ambulatory Visit: Payer: Medicaid Other | Admitting: Nurse Practitioner

## 2020-02-20 ENCOUNTER — Encounter (HOSPITAL_COMMUNITY): Payer: Self-pay

## 2020-02-20 DIAGNOSIS — H1013 Acute atopic conjunctivitis, bilateral: Secondary | ICD-10-CM | POA: Diagnosis not present

## 2020-02-20 DIAGNOSIS — H10013 Acute follicular conjunctivitis, bilateral: Secondary | ICD-10-CM | POA: Diagnosis not present

## 2020-02-20 MED ORDER — OLOPATADINE HCL 0.2 % OP SOLN
1.0000 [drp] | Freq: Every day | OPHTHALMIC | 0 refills | Status: DC
Start: 2020-02-20 — End: 2020-02-26

## 2020-02-20 NOTE — ED Provider Notes (Signed)
[facility]   [id number] 02/20/20 Arrival Time: 0911  ASSESSMENT & PLAN:  1. Allergic conjunctivitis of both eyes    No sign of bacterial infection. Discussed.  Begin: Meds ordered this encounter  Medications  . Olopatadine HCl 0.2 % SOLN    Sig: Apply 1 drop to eye daily.    Dispense:  2.5 mL    Refill:  0    School/daycare note written. Ophthalmic drops per orders. Warm compress to eye(s). Local eye care discussed.  Reviewed expectations re: course of current medical issues. Questions answered. Outlined signs and symptoms indicating need for more acute intervention. Patient verbalized understanding. After Visit Summary given.   SUBJECTIVE:  Julie Hendrix is a 34 y.o. female who presents with complaint of persistent bilateral eye irritation with watery drainage. Onset abrupt, approximately 2-3  days ago. Noted after wearing fake eyelashes. Vision is occasional blurry with the watery drainage. Injury: no. Contact lens use: no. Recent illness: no. Self treatment: OTC 'Refresh' gtts without much relief.   OBJECTIVE:  Vitals:   02/20/20 1025  BP: (!) 148/94  Pulse: 76  Resp: 19  Temp: (!) 97.5 F (36.4 C)  TempSrc: Oral  SpO2: 99%    General appearance: alert; no distress HEENT: [place]; AT; PERRLA; no restriction of the extraocular movements OU: without reported pain; with conjunctival injection; with wateray drainage; without corneal opacities; without limbal flush; without periorbital swelling or erythema  Neck: supple without LAD Lungs: unlabored respirations Skin: warm and dry Psychological: alert and cooperative; normal mood and affect    Visual Acuity  Right Eye Distance: 20/25 Left Eye Distance: 20/70 (has baseline vision deficits in this eye) Bilateral Distance: 20/25  No Known Allergies  Past Medical History:  Diagnosis Date  . Abnormal Pap smear   . 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 Dysplasia of cervix, low grade (CIN 1)   . HPV (human papilloma virus) anogenital infection    Social History   Socioeconomic History  . Marital status: Single    Spouse name: Not on file  . Number of children: Not on file  . Years of education: Not on file  . Highest education level: Not on file  Occupational History  . Not on file  Tobacco Use  . Smoking status: Current Every Day Smoker    Packs/day: 0.25    Types: Cigarettes, Cigars    Last attempt to quit: 11/22/2017    Years since quitting: 2.2  . Smokeless tobacco: Never Used  . Tobacco comment: black and mild  Vaping Use  . Vaping Use: Never used  Substance and Sexual Activity  . Alcohol use: Yes    Alcohol/week: 9.0 standard drinks    Types: 3 Glasses of wine, 3 Cans of beer, 3 Shots of liquor per week  . Drug use: No  . Sexual activity: Yes    Birth control/protection: None  Other Topics Concern  . Not on file  Social History Narrative  . Not on file   Social Determinants of Health   Financial Resource Strain:   . Difficulty of Paying Living Expenses: Not on file  Food Insecurity: No Food Insecurity  . Worried About Programme researcher, broadcasting/film/video in the Last Year: Never true  . Ran Out of Food in the Last Year: Never true  Transportation Needs: No Transportation Needs  . Lack of Transportation (Medical): No  . Lack of Transportation (Non-Medical): No  Physical Activity:   .  Days of Exercise per Week: Not on file  . Minutes of Exercise per Session: Not on file  Stress:   . Feeling of Stress : Not on file  Social Connections:   . Frequency of Communication with Friends and Family: Not on file  . Frequency of Social Gatherings with Friends and Family: Not on file  . Attends Religious Services: Not on file  . Active Member of Clubs or Organizations: Not on file  . Attends Banker Meetings: Not on file  . Marital Status: Not on file  Intimate Partner Violence:   . Fear of Current or  Ex-Partner: Not on file  . Emotionally Abused: Not on file  . Physically Abused: Not on file  . Sexually Abused: Not on file   Family History  Problem Relation Age of Onset  . Hypertension Father   . Diabetes Father   . Heart attack Father   . Hypertension Mother    Past Surgical History:  Procedure Laterality Date  . COLPOSCOPY    . CRYOTHERAPY  06/08/2017   cervix     Mardella Layman, MD 02/20/20 1101

## 2020-02-20 NOTE — ED Triage Notes (Addendum)
Pt in with c/o bilateral eye irritation that started Sunday after wearing falsee eye lashes. States that her eyes are very itchy and have been draining a lot and her vision is blurry  Eye Redness noted  Pt has been using eyedrops for comfort with some relief

## 2020-02-25 ENCOUNTER — Other Ambulatory Visit: Payer: Self-pay | Admitting: Family Medicine

## 2020-02-26 ENCOUNTER — Other Ambulatory Visit: Payer: Self-pay

## 2020-02-26 ENCOUNTER — Encounter: Payer: Self-pay | Admitting: Obstetrics and Gynecology

## 2020-02-26 ENCOUNTER — Ambulatory Visit (INDEPENDENT_AMBULATORY_CARE_PROVIDER_SITE_OTHER): Payer: Medicaid Other | Admitting: Obstetrics and Gynecology

## 2020-02-26 VITALS — BP 132/79 | HR 97 | Temp 98.0°F | Wt 265.0 lb

## 2020-02-26 DIAGNOSIS — R109 Unspecified abdominal pain: Secondary | ICD-10-CM

## 2020-02-26 MED ORDER — CYCLOBENZAPRINE HCL 10 MG PO TABS: 10.0000 mg | ORAL_TABLET | Freq: Three times a day (TID) | ORAL | 1 refills | Status: DC | PRN

## 2020-02-26 NOTE — Patient Instructions (Addendum)
*Soak in tub of warm water with about 1/2 cup of Epsom salt. *Take Flexeril at bedtime * Massage area with a tennis ball as needed * Pelvic tilts on the floor at least twice a day * Schedule with chiropractor for readjustment  Musculoskeletal Pain Musculoskeletal pain refers to aches and pains in your bones, joints, muscles, and the tissues that surround them. This pain can occur in any part of the body. It can last for a short time (acute) or a long time (chronic). A physical exam, lab tests, and imaging studies may be done to find the cause of your musculoskeletal pain. Follow these instructions at home:  Lifestyle  Try to control or lower your stress levels. Stress increases muscle tension and can worsen musculoskeletal pain. It is important to recognize when you are anxious or stressed and learn ways to manage it. This may include: ? Meditation or yoga. ? Cognitive or behavioral therapy. ? Acupuncture or massage therapy.  You may continue all activities unless the activities cause more pain. When the pain gets better, slowly resume your normal activities. Gradually increase the intensity and duration of your activities or exercise. Managing pain, stiffness, and swelling  Take over-the-counter and prescription medicines only as told by your health care provider.  When your pain is severe, bed rest may be helpful. Lie or sit in any position that is comfortable, but get out of bed and walk around at least every couple of hours.  If directed, apply heat to the affected area as often as told by your health care provider. Use the heat source that your health care provider recommends, such as a moist heat pack or a heating pad. ? Place a towel between your skin and the heat source. ? Leave the heat on for 20-30 minutes. ? Remove the heat if your skin turns bright red. This is especially important if you are unable to feel pain, heat, or cold. You may have a greater risk of getting  burned.  If directed, put ice on the painful area. ? Put ice in a plastic bag. ? Place a towel between your skin and the bag. ? Leave the ice on for 20 minutes, 2-3 times a day. General instructions  Your health care provider may recommend that you see a physical therapist. This person can help you come up with a safe exercise program. Do any exercises as told by your physical therapist.  Keep all follow-up visits, including any physical therapy visits, as told by your health care providers. This is important. Contact a health care provider if:  Your pain gets worse.  Medicines do not help ease your pain.  You cannot use the part of your body that hurts, such as your arm, leg, or neck.  You have trouble sleeping.  You have trouble doing your normal activities. Get help right away if:  You have a new injury and your pain is worse or different.  You feel numb or you have tingling in the painful area. Summary  Musculoskeletal pain refers to aches and pains in your bones, joints, muscles, and the tissues that surround them.  This pain can occur in any part of the body.  Your health care provider may recommend that you see a physical therapist. This person can help you come up with a safe exercise program. Do any exercises as told by your physical therapist.  Lower your stress level. Stress can worsen musculoskeletal pain. Ways to lower stress may include meditation, yoga, cognitive  or behavioral therapy, acupuncture, and massage therapy. This information is not intended to replace advice given to you by your health care provider. Make sure you discuss any questions you have with your health care provider. Document Revised: 03/19/2017 Document Reviewed: 05/06/2016 Elsevier Patient Education  2020 [employer].

## 2020-02-26 NOTE — Progress Notes (Signed)
°  GYNECOLOGY PROGRESS NOTE  History:  Julie Hendrix is a 34 y.o. [id number] presents to [facility] office today for problem gyn visit. She reports LT side pain x 1 month; worsening.  She does home health care. She reports the pain is worsened when she goes from sitting to standing position.  She has not tried anything for the pain. She completed treatment for BV yesterday. She does not think the pain and the BV are related. She denies h/a, dizziness, shortness of breath, n/v, or fever/chills. LMP 01/26/2020.  The following portions of the patient's history were reviewed and updated as appropriate: allergies, current medications, past family history, past medical history, past social history, past surgical history and problem list.  Review of Systems:  Pertinent items are noted in HPI.   Objective:  Physical Exam Blood pressure 132/79, pulse 97, temperature 98 F (36.7 C), weight 265 lb (120.2 kg), last menstrual period 01/26/2020. VS reviewed, nursing note reviewed,  Constitutional: well developed, well nourished, no distress HEENT: normocephalic CV: normal rate Pulm/chest wall: normal effort Breast Exam: deferred Abdomen: soft Neuro: alert and oriented x 3 Skin: warm, dry Psych: affect normal Pelvic exam: declined  Assessment & Plan:  1. Left flank pain - Rx for cyclobenzaprine (FLEXERIL) 10 MG tablet; Take 1 tablet (10 mg total) by mouth every 8 (eight) hours as needed for muscle spasms.  Dispense: 30 tablet; Refill: 1 - Advised to soak in tub or warm water with epsom salt, massage area with tennis ball prn, pelvic tilts while lying on the floor with pillow under backs of knees, and schedule with chiropractor for readjustment - Information provided on MSK pain - F/U, if symptoms not improved or worsen - Keep upcoming appt on 03/05/2020 - Patient verbalized an understanding of the plan of care and agrees.      Raelyn Mora, CNM 4:10 PM

## 2020-03-05 ENCOUNTER — Ambulatory Visit: Payer: Medicaid Other | Admitting: Advanced Practice Midwife

## 2020-03-05 ENCOUNTER — Encounter: Payer: Self-pay | Admitting: Nurse Practitioner

## 2020-03-05 ENCOUNTER — Other Ambulatory Visit: Payer: Self-pay

## 2020-03-05 ENCOUNTER — Ambulatory Visit (INDEPENDENT_AMBULATORY_CARE_PROVIDER_SITE_OTHER): Payer: Medicaid Other | Admitting: Nurse Practitioner

## 2020-03-05 VITALS — BP 133/84 | HR 94 | Ht 63.0 in | Wt 268.5 lb

## 2020-03-05 DIAGNOSIS — B9689 Other specified bacterial agents as the cause of diseases classified elsewhere: Secondary | ICD-10-CM | POA: Diagnosis not present

## 2020-03-05 DIAGNOSIS — N760 Acute vaginitis: Secondary | ICD-10-CM | POA: Diagnosis not present

## 2020-03-05 DIAGNOSIS — E282 Polycystic ovarian syndrome: Secondary | ICD-10-CM

## 2020-03-05 MED ORDER — METRONIDAZOLE 0.75 % VA GEL: VAGINAL | 6 refills | Status: DC

## 2020-03-05 NOTE — Progress Notes (Signed)
GYNECOLOGY OFFICE VISIT NOTE   History:  34 y.o. Julie Hendrix here today for recurrent BV and wants extended treatment.  Is trying to get pregnancy but has irregular menstrual cycles.  Wants to know what to do.  Has tried an extended period of using ovulation kits. Weight is the same as her annual visit from March 2021.  She denies any abnormal vaginal discharge, bleeding, pelvic pain or other concerns.  Is just finishing a menstrual cycle that started on 02-29-20.  Past Medical History:  Diagnosis Date  . Abnormal Pap smear   . 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 Dysplasia of cervix, low grade (CIN 1)   . HPV (human papilloma virus) anogenital infection     Past Surgical History:  Procedure Laterality Date  . COLPOSCOPY    . CRYOTHERAPY  06/08/2017   cervix    The following portions of the patient's history were reviewed and updated as appropriate: allergies, current medications, past family history, past medical history, past social history, past surgical history and problem list.   Health Maintenance:  Normal pap and negative HRHPV on 06-20-19   Review of Systems:  Pertinent items noted in HPI and remainder of comprehensive ROS otherwise negative.  Objective:  Physical Exam BP 133/84   Pulse 94   Ht 5\' 3"  (1.6 m)   Wt 268 lb 8 oz (121.8 kg)   LMP 02/29/2020 (Exact Date)   BMI 47.56 kg/m  CONSTITUTIONAL: Well-developed, well-nourished female in no acute distress.  HENT:  Normocephalic, atraumatic. External right and left ear normal.  EYES: Conjunctivae and EOM are normal. Pupils are equal, round.  No scleral icterus.  NECK: Normal range of motion, supple, no masses SKIN: Skin is warm and dry. No rash noted. Not diaphoretic. No erythema. No pallor. NEUROLOGIC: Alert and oriented to person, place, and time. Normal muscle tone coordination. No cranial nerve deficit noted. PSYCHIATRIC: Normal mood and affect. Normal behavior. Normal  judgment and thought content. CARDIOVASCULAR: Normal heart rate noted RESPIRATORY: Effort and breath sounds normal, no problems with respiration noted ABDOMEN: Soft, no distention noted.   PELVIC: Deferred MUSCULOSKELETAL: Normal range of motion. No edema noted.  Labs and Imaging No results found.  Assessment & Plan:  1. PCOS (polycystic ovarian syndrome) Referred to infertility as she has irregular menstrual cycles and obesity - likely is PCOS although she reports having an ultrasound that did not show "cysts".  Is not using ovulation kits at this time.  Reviewed that ovulation is likely 2 weeks prior to menses beginning.  Reviewed that this office does not treat infertility and referral sent.  Advised front desk will schedule.  Also advised weight loss of 10 % or 20 pounds.  - Ambulatory referral to Infertility  2.  Bacterial vaginosis - recurrent Is frustrated with always getting BV.  Wants extended treatment.  Advised beginning with 7 days of metronidazole but she reports she just finished it recently and wants to start with twice weekly Metrogel for 3 months - prescribed Metrogel for her.  Reviewed that BV comes from pH inbalance.  Routine preventative health maintenance measures emphasized. Please refer to After Visit Summary for other counseling recommendations.   Return in about 5 months (around 07/22/2020) for annual exam.   Total face-to-face time with patient: 15 minutes.  Over 50% of encounter was spent on counseling and coordination of care.  09/21/2020, RN, MSN, NP-BC Nurse Practitioner, [facility] for [facility] 03/05/2020 5:24 PM

## 2020-03-05 NOTE — Patient Instructions (Signed)
COVID-19 Vaccination if You Are Pregnant or Breastfeeding ° °The Society for Maternal-Fetal Medicine (SMFM) and other pregnancy experts recommend that pregnant and lactating people be vaccinated against COVID-19. The Centers for Disease Control and Prevention (CDC) also recommend vaccination for “all people aged 36 years and older, including people who are pregnant, breastfeeding, trying to get pregnant now, or might become pregnant in the future.” Vaccination is the best way to reduce the risks of COVID-19 infection and COVID-related complications for both you and your baby. ° °Three vaccines are available to prevent COVID-19: °• The two-dose Pfizer vaccine for people 12 years and older--APPROVED by the [place] Food and Drug Administration on December 11, 2019 °• The two-dose Moderna vaccine for people 18 years and older--AUTHORIZED for emergency use °• The one-dose Johnson & Johnson vaccine for people 18 years and older (you may also see this vaccine referred to as the “Janssen vaccine”)--AUTHORIZED for emergency use ° °For those receiving the Pfizer and Moderna vaccines, the second dose is given 21 days (Pfizer) and 28 days (Moderna) after the first dose. The Johnson & Johnson vaccine is only one dose. ° °Information for Pregnant Individuals °If you are pregnant or planning to become pregnant and are thinking about getting vaccinated, consider talking with your health care professional about the vaccine.  ° °To help with your decision, you should consider the following key points: °Anyone can get the COVID vaccines free of charge regardless of immigration status or whether they have insurance. You may be asked for your social security number, but it is  °NOT required to get vaccinated. ° °What are benefits of getting the COVID-19 vaccines during pregnancy?  °• The vaccines can help protect you from getting COVID-19. With the two-dose vaccines, you must get both doses for maximum effectiveness. It’s not yet known how  long protection lasts. ° °• Another potential benefit is that getting the vaccine while pregnant may help you pass antiCOVID-19 antibodies to your baby. In numerous studies of vaccinated moms, antibodies were found in the umbilical cord blood of babies and in the mother’s breastmilk. ° °• The CDC, along with other federal partners, are monitoring people who have been vaccinated for serious side effects. So far, more than 139,000 pregnant people have been °vaccinated. No unexpected pregnancy or fetal problems have occurred. There have been no reports of any increased risk of pregnancy loss, growth problems, or birth defects. ° °• A safe vaccine is generally considered one in which the benefits of being vaccinated outweigh the risks. The current vaccines are not live vaccines. There is only a very small  °chance that they cross the placenta, so it’s unlikely that they even reach the fetus. Vaccines don’t affect future fertility. The only people who should NOT get vaccinated are those who have had a severe allergic reaction to vaccines in the past or any vaccine ingredients. ° °• Side effects may occur in the first 3 days after getting vaccinated.1 These include mild to moderate fever, headache, and muscle aches. Side effects may be worse after the second dose of the Pfizer and Moderna vaccines. Fever should be avoided during pregnancy,especially in the first trimester. Those who develop a fever after vaccination can take °acetaminophen (Tylenol). This medication is safe to use during pregnancy and does not  affect how the vaccine works.  ° °What are the known risks of getting COVID-19 during pregnancy?  °About 1 to 3 per 1,000 pregnant women with COVID-19 will develop severe disease. Compared with those who   aren’t pregnant, pregnant people infected by the COVID-19 virus: °• Are 3 times more likely to need ICU care °• Are 2 to 3 times more likely to need advanced life support and a breathing tube  °• Have a small  increased risk of dying due to COVID-19 °They may also be at increased risk of stillbirth and preterm birth. ° °What is my risk of getting COVID-19?  °Your risk of getting COVID-19 depends on the chance that you will come into contact with another infected person. The risk may be higher if you live in a community where there is a lot of COVID-19 infection or work in healthcare or another high-contact setting.  ° °What is my risk for severe complications if I get COVID-19?  °Data show that older pregnant women; those with preexisting health conditions, such as a body mass index higher than 35 kg/m2, diabetes, and heart disorders; and Black or Latinx women have an especially increased risk of severe disease and death from COVID-19. °  °If you still have questions about the vaccines or need more information, ask your health care provider or go to the Centers for Disease Control and Prevention’s COVID-19 vaccine webpage.  ° °An Update on the Johnson & Johnson Vaccine  ° °In April 2021, the FDA and CDC called for a brief pause to use of the Johnson & Johnson vaccine. They did so after reports of a severe side effect in a very small number of women younger than age 50 following vaccination. This side effect, called thrombosis with thrombocytopenia syndrome (TTS), causes blood clots (thrombosis) combined with low levels of platelets (thrombocytopenia). ° °TTS following the Johnson & Johnson vaccine is extremely rare. At the time of this update, it has occurred in only 7 people per 1 million Johnson & Johnson shots given. According to the CDC, being on hormonal birth control (the pill, patch, or ring), pregnancy, breastfeeding, or being recently pregnant does not make you more likely to develop TTS after getting the Johnson & Johnson vaccine. The pause was lifted on August 11, 2019, after the FDA and CDC determined that the known benefits of the Johnson & Johnson vaccine far outweigh the risks.  ° °Health care professionals  have been alerted to the possibility of this side effect in people who have received the Johnson & Johnson vaccine. National organizations continue to recommend COVID-19 vaccination with any of the vaccines for pregnant women. All women younger than age 50 years, whether pregnant, breastfeeding, or not, should be aware of the very rare risk of TTS after getting the Johnson & Johnson vaccine. The Pfizer and Moderna vaccines don’t have this risk. If you get the Johnson & Johnson vaccine,  °seek medical help right away if you develop any of the following symptoms within 3 weeks of getting your shot: ° °• Severe or persistent headaches or blurred vision °• Shortness of breath °• Chest pain °• Leg swelling °• Persistent abdominal pain °• Easy bruising or tiny blood spots under the skin beyond the injection site ° °Experts continue to collect health and safety information from pregnant people who have been vaccinated. If you have questions about vaccination during pregnancy, visit the CDC website or talk to your health care professional. Information for Breastfeeding/Lactating Individuals The Society for Maternal-Fetal Medicine and other pregnancy experts recommend COVID-19 vaccination for people who are breastfeeding/lactating. You don’t have to delay or stop  °breastfeeding just because you get vaccinated.  ° °Getting Vaccinated  °You can get vaccinated at   any time during pregnancy. The CDC is committed to monitoring the vaccine’s safety for all individuals. Your health professional or vaccine clinic may give you information about enrolling in the v-safe after vaccination health checker (see the box below).Even after you’re fully vaccinated, it is important to follow the CDC’s guidance for wearing a mask indoors in areas where there are substantial or high rates of COVID-19 infection.  ° °What Happens When You Enroll in v-Safe?  °The v-safe after vaccination health checker program lets the CDC check in with you after  your vaccination. At sign-up, you can indicate that you are pregnant. Once you do that, expect the following: °• Someone may call you from the v-safe program to ask initial questions and get more information. °• You may be asked to enroll in the vaccine pregnancy registry, which is collecting information about any effects of the vaccine during pregnancy. This is a great way to help scientists monitor the vaccine’s safety and effectiveness.  ° °References °1. Oliver SE, Gargano JW, Marin M, Wallace M, Curran KG, Chamberland M, et al. The Advisory Committee on Immunization Practices’ Interim Recommendation for Use of Pfizer-BioNTech  °COVID-19 Vaccine -- [place], December 2020. MMWR Morbidity and Mortality Weekly Report 2020;69. °2. FDA Briefing Document. Janssen Ad26.COV2.S Vaccine for the Prevention of COVID-19. 2021. Accessed Jun 23, 2019; Available from: https://www.fda.gov/media/146217/download °3. PFIZER-BIONTECH COVID-19 VACCINE [package insert] [place]: Pfizer and [place], [place]: Biontech;2020. °4. FDA Briefing Document. Moderna COVID-19 Vaccine. 2020. Accessed 2020, Dec 18; Available from: https://www.fda.gov/media/144434/download  °5. Gray KJ, Bordt EA, Atyeo C, Deriso E, Akinwunmi B, Young N, et al. COVID-19 vaccine response in pregnant and lactating women: a cohort study. Am J Obstet Gynecol 2021 Mar 24. °6. Panagiotakopoulos L, Myers TR, Gee J, Lipkind HS, Kharbanda EO, Ryan DS, et al. SARS-CoV-2 Infection Among Hospitalized Pregnant Women: Reasons for Admission and Pregnancy  °Characteristics - [facility], March 1-Sep 17, 2018. MMWR Morb Mortal Wkly Rep 2020 Sep 23;69(38):1355-9. °7. Zambrano LD, Ellington S, Strid P, Galang RR, Oduyebo T, Tong VT, et al. Update: Characteristics of Symptomatic Women of Reproductive Age with Laboratory-Confirmed SARSCoV-2 Infection by Pregnancy Status - [place], January 22-January 21, 2019. MMWR Morb Mortal Wkly Rep 2020 Nov  6;69(44):1641-7. °8. Delahoy MJ, Whitaker M, O'Halloran A, Chai SJ, Kirley PD, Alden N, et al. Characteristics and Maternal and Birth Outcomes of Hospitalized Pregnant Women with Laboratory-Confirmed COVID-19 - COVID-NET, 13 [place], March 1-December 10, 2018. MMWR Morb Mortal Wkly Rep 2020 Sep 25;69(38):1347-54. ° °

## 2020-03-05 NOTE — Progress Notes (Signed)
Pt states has been wanting to get pregnant & due to abnormal periods has not been able to do so.

## 2020-03-11 ENCOUNTER — Other Ambulatory Visit: Payer: Self-pay | Admitting: Family Medicine

## 2020-04-14 ENCOUNTER — Other Ambulatory Visit: Payer: Self-pay

## 2020-04-14 ENCOUNTER — Ambulatory Visit (HOSPITAL_COMMUNITY)
Admission: EM | Admit: 2020-04-14 | Discharge: 2020-04-14 | Disposition: A | Discharge status: Home / Self Care | Payer: Medicaid Other | Attending: Physician Assistant | Admitting: Physician Assistant

## 2020-04-14 ENCOUNTER — Encounter (HOSPITAL_COMMUNITY): Payer: Self-pay

## 2020-04-14 DIAGNOSIS — R059 Cough, unspecified: Secondary | ICD-10-CM

## 2020-04-14 DIAGNOSIS — J028 Acute pharyngitis due to other specified organisms: Secondary | ICD-10-CM | POA: Insufficient documentation

## 2020-04-14 DIAGNOSIS — B9789 Other viral agents as the cause of diseases classified elsewhere: Secondary | ICD-10-CM

## 2020-04-14 DIAGNOSIS — Z20822 Contact with and (suspected) exposure to covid-19: Secondary | ICD-10-CM

## 2020-04-14 NOTE — ED Provider Notes (Signed)
[facility]    CSN: [id number] Arrival date & time: 04/14/20  1511      History   Chief Complaint Chief Complaint  Patient presents with   Cough    HPI Julie Hendrix is a 34 y.o. female.   Patient here c/w cough and sore throat x 2 days.  She was exposed to cOVID 3 days ago.  She has not had COVID vaccine.     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   cryotherapy 06/08/17   Dysplasia of cervix, low grade (CIN 1)    HPV (human papilloma virus) anogenital infection     Patient Active Problem List   Diagnosis Date Noted   Back pain 09/06/2019   Abdominal cramping 08/09/2019   Gastroesophageal reflux disease 06/06/2019   Missed period 06/06/2019   Smoker 02/18/2019   Prediabetes 06/17/2018   Mumps without complication 05/18/2018   Essential hypertension 05/18/2018   Morbid obesity with body mass index (BMI) of 45.0 to 49.9 in adult (HCC) 11/30/2017   PCOS (polycystic ovarian syndrome) 11/30/2017   Low grade squamous intraepithelial lesion on cytologic smear of cervix (LGSIL) 05/21/2017   Breast lump on right side at 10 o'clock position 01/31/2013    Past Surgical History:  Procedure Laterality Date   COLPOSCOPY     CRYOTHERAPY  06/08/2017   cervix    OB History    Gravida  3   Para  2   Term  2   Preterm  0   AB  1   Living  2     SAB  0   IAB  1   Ectopic  0   Multiple  0   Live Births  2            Home Medications    Prior to Admission medications   Medication Sig Start Date End Date Taking? Authorizing Provider  albuterol (VENTOLIN HFA) 108 (90 Base) MCG/ACT inhaler Inhale 1-2 puffs into the lungs every 6 (six) hours as needed for wheezing or shortness of breath. 01/27/20   Particia Nearing, PA-C  amLODipine (NORVASC) 10 MG tablet Take 1 tablet (10 mg total) by mouth daily. 10/17/19   Allayne Stack,  DO  aspirin EC 81 MG tablet Take 81 mg by mouth daily.    [provider]  AZO-CRANBERRY PO Take by mouth.    [provider]  cyclobenzaprine (FLEXERIL) 10 MG tablet Take 1 tablet (10 mg total) by mouth every 8 (eight) hours as needed for muscle spasms. Patient not taking: Reported on 03/05/2020 02/26/20   Raelyn Mora, CNM  fluconazole (DIFLUCAN) 150 MG tablet Take one pill today and then second pill three days later. Repeat in a week if necessary. Patient not taking: Reported on 03/05/2020 02/15/20   Marylene Land, CNM  metroNIDAZOLE (METROGEL VAGINAL) 0.75 % vaginal gel Insert vaginally on Mondays and Thursdays at bedtime for 3 months. 03/05/20   Currie Paris, NP  Multiple Vitamin (MULTIVITAMIN WITH MINERALS) TABS tablet Take 1 tablet by mouth daily.    [provider]  triamcinolone cream (KENALOG) 0.1 % Apply 1 application topically 2 (two) times daily. Patient not taking: Reported on 02/13/2020 07/31/19   Wieters, Hallie C, PA-C  dicyclomine (BENTYL) 10 MG capsule Take 1 capsule (10 mg total) by mouth 4 (four) times daily -  before meals and at bedtime. 08/09/19  09/07/19  Garnette Gunner, MD  omeprazole (PRILOSEC) 20 MG capsule Take 1 capsule (20 mg total) by mouth daily. 06/06/19 09/07/19  Garnette Gunner, MD    Family History Family History  Problem Relation Age of Onset   Hypertension Father    Diabetes Father    Heart attack Father    Hypertension Mother     Social History Social History   Tobacco Use   Smoking status: Current Every Day Smoker    Packs/day: 0.25    Types: Cigarettes, Cigars    Last attempt to quit: 11/22/2017    Years since quitting: 2.3   Smokeless tobacco: Never Used   Tobacco comment: black and mild  Vaping Use   Vaping Use: Never used  Substance Use Topics   Alcohol use: Yes    Alcohol/week: 9.0 standard drinks    Types: 3 Glasses of wine, 3 Cans of beer, 3 Shots of liquor per week   Drug  use: No     Allergies   Patient has no known allergies.   Review of Systems Review of Systems  Constitutional: Negative for chills, fatigue and fever.  HENT: Positive for sore throat. Negative for congestion, ear pain, nosebleeds, postnasal drip, rhinorrhea, sinus pressure and sinus pain.   Eyes: Negative for pain and redness.  Respiratory: Positive for cough (dry, mild). Negative for shortness of breath and wheezing.   Gastrointestinal: Negative for abdominal pain, diarrhea, nausea and vomiting.  Musculoskeletal: Negative for arthralgias and myalgias.  Skin: Negative for rash.  Neurological: Negative for light-headedness and headaches.  Hematological: Negative for adenopathy. Does not bruise/bleed easily.  Psychiatric/Behavioral: Negative for confusion and sleep disturbance.     Physical Exam Triage Vital Signs ED Triage Vitals  Enc Vitals Group     BP 04/14/20 1614 136/86     Pulse Rate 04/14/20 1614 84     Resp 04/14/20 1614 20     Temp 04/14/20 1614 98.4 F (36.9 C)     Temp Source 04/14/20 1614 Oral     SpO2 04/14/20 1614 100 %     Weight --      Height --      Head Circumference --      Peak Flow --      Pain Score 04/14/20 1611 0     Pain Loc --      Pain Edu? --      Excl. in GC? --    No data found.  Updated Vital Signs BP 136/86 (BP Location: Right Arm)    Pulse 84    Temp 98.4 F (36.9 C) (Oral)    Resp 20    LMP 04/01/2020    SpO2 100%   Visual Acuity Right Eye Distance:   Left Eye Distance:   Bilateral Distance:    Right Eye Near:   Left Eye Near:    Bilateral Near:     Physical Exam Vitals and nursing note reviewed.  Constitutional:      General: She is not in acute distress.    Appearance: Normal appearance. She is obese. She is not ill-appearing.  HENT:     Head: Normocephalic and atraumatic.     Right Ear: Tympanic membrane and ear canal normal.     Left Ear: Tympanic membrane and ear canal normal.     Nose: No congestion or  rhinorrhea.     Mouth/Throat:     Pharynx: No oropharyngeal exudate or posterior oropharyngeal erythema.  Eyes:  General: No scleral icterus.    Extraocular Movements: Extraocular movements intact.     Conjunctiva/sclera: Conjunctivae normal.  Cardiovascular:     Rate and Rhythm: Normal rate and regular rhythm.     Heart sounds: No murmur heard.   Pulmonary:     Effort: Pulmonary effort is normal. No respiratory distress.     Breath sounds: Normal breath sounds. No wheezing or rales.  Musculoskeletal:     Cervical back: Normal range of motion. No rigidity.  Skin:    Capillary Refill: Capillary refill takes less than 2 seconds.     Coloration: Skin is not jaundiced.     Findings: No rash.  Neurological:     General: No focal deficit present.     Mental Status: She is alert and oriented to person, place, and time.     Motor: No weakness.     Gait: Gait normal.  Psychiatric:        Mood and Affect: Mood normal.        Behavior: Behavior normal.      UC Treatments / Results  Labs (all labs ordered are listed, but only abnormal results are displayed) Labs Reviewed  SARS CORONAVIRUS 2 (TAT 6-24 HRS)    EKG   Radiology No results found.  Procedures Procedures (including critical care time)  Medications Ordered in UC Medications - No data to display  Initial Impression / Assessment and Plan / UC Course  I have reviewed the triage vital signs and the nursing notes.  Pertinent labs & imaging results that were available during my care of the patient were reviewed by me and considered in my medical decision making (see chart for details).     Remain in isolation pending COVID test results. Final Clinical Impressions(s) / UC Diagnoses   Final diagnoses:  Cough  Sore throat (viral)  Encounter for laboratory testing for COVID-19 virus     Discharge Instructions     Follow up with PCP if symptoms do not improve Remain in isolation until test results are  known.      ED Prescriptions    None     PDMP not reviewed this encounter.   Evern Core, PA-C 04/14/20 1755

## 2020-04-14 NOTE — Discharge Instructions (Signed)
Follow up with PCP if symptoms do not improve Remain in isolation until test results are known.

## 2020-04-14 NOTE — ED Triage Notes (Signed)
Pt presents with slight non productive cough and dry throat X 2 days.

## 2020-04-15 LAB — SARS CORONAVIRUS 2 (TAT 6-24 HRS): SARS Coronavirus 2: NEGATIVE

## 2020-05-15 ENCOUNTER — Other Ambulatory Visit: Payer: Self-pay

## 2020-05-15 ENCOUNTER — Ambulatory Visit (INDEPENDENT_AMBULATORY_CARE_PROVIDER_SITE_OTHER): Payer: Medicaid Other | Admitting: General Practice

## 2020-05-15 ENCOUNTER — Other Ambulatory Visit (HOSPITAL_COMMUNITY)
Admission: RE | Admit: 2020-05-15 | Discharge: 2020-05-15 | Disposition: A | Discharge status: Home / Self Care | Payer: Medicaid Other | Source: Ambulatory Visit | Attending: Family Medicine | Admitting: Family Medicine

## 2020-05-15 VITALS — BP 155/98 | HR 79 | Wt 266.7 lb

## 2020-05-15 DIAGNOSIS — N898 Other specified noninflammatory disorders of vagina: Secondary | ICD-10-CM

## 2020-05-15 DIAGNOSIS — Z113 Encounter for screening for infections with a predominantly sexual mode of transmission: Secondary | ICD-10-CM | POA: Diagnosis not present

## 2020-05-15 NOTE — Progress Notes (Signed)
Chart reviewed for nurse visit. Agree with plan of care.   Venora Maples, MD 05/15/20 2:09 PM

## 2020-05-15 NOTE — Progress Notes (Signed)
Pt here today for self swab due to having vaginal discharge and odor x 1 week. Pt states symptoms started after cycle start. Denies abd pain or vaginal bleeding.  LMP 05/03/2020. Pt states had unprotected intercourse on 05/02/2020.   Pt also states would like to have blood STD testing. Denies any STD known exposure at this time.  Pt also needing to schedule Annual exam and has scheduled with Lilyan Punt, NP on 07/09/2020. Pt agreeable and verbalized understanding.   Pt aware that self swab collected today will result in 24-48 hours and will be called with any results needing treatment. Pt verbalized understanding.   Pt aware that BP was elevated at visit today. Pt is taking BP meds as directed. Pt advised to call family medicine and schedule appt for BP follow up. Pt agreeable. Denies headaches, seeing spots, visual changes or dizziness.   Judeth Cornfield, RN 05/15/20

## 2020-05-16 ENCOUNTER — Other Ambulatory Visit: Payer: Self-pay | Admitting: Family Medicine

## 2020-05-16 LAB — CERVICOVAGINAL ANCILLARY ONLY
Bacterial Vaginitis (gardnerella): POSITIVE — AB
Candida Glabrata: NEGATIVE
Candida Vaginitis: NEGATIVE
Chlamydia: NEGATIVE
Comment: NEGATIVE
Comment: NEGATIVE
Comment: NEGATIVE
Comment: NEGATIVE
Comment: NEGATIVE
Comment: NORMAL
Neisseria Gonorrhea: NEGATIVE
Trichomonas: NEGATIVE

## 2020-05-16 LAB — HEPATITIS C ANTIBODY: Hep C Virus Ab: <0.1 s/co ratio (ref 0.0–0.9)

## 2020-05-16 LAB — HIV ANTIBODY (ROUTINE TESTING W REFLEX): HIV Screen 4th Generation wRfx: NONREACTIVE

## 2020-05-16 LAB — RPR: RPR Ser Ql: NONREACTIVE

## 2020-05-16 LAB — HEPATITIS B SURFACE ANTIGEN: Hepatitis B Surface Ag: NEGATIVE

## 2020-05-16 MED ORDER — METRONIDAZOLE 500 MG PO TABS: 500.0000 mg | ORAL_TABLET | Freq: Two times a day (BID) | ORAL | 0 refills | Status: AC

## 2020-05-16 NOTE — Progress Notes (Signed)
BV treatment orders

## 2020-05-24 ENCOUNTER — Ambulatory Visit: Payer: Medicaid Other | Admitting: Family Medicine

## 2020-05-27 ENCOUNTER — Ambulatory Visit: Payer: Medicaid Other | Admitting: Family Medicine

## 2020-05-27 ENCOUNTER — Other Ambulatory Visit: Payer: Self-pay

## 2020-05-27 DIAGNOSIS — R7303 Prediabetes: Secondary | ICD-10-CM

## 2020-05-27 NOTE — Progress Notes (Signed)
Appointment canceled and rescheduled for 2/17.  Called patient to briefly discuss, she had also been scheduled to discuss her hypertension medications with me earlier this month but canceled.  She reports she has been taking her Norvasc as prescribed.  Recently in the past week or so she has started going to the gym and already feeling better.  States her BP has been elevated at home over systolics 140.  She is not sure if her cuff is correct.  She still smokes black and milds, occasionally will smoke prior to checking her BP at home.  Recommended bringing her home BP cuff into appointment next week to compare.  Encouraged her to check her BP at home with this cuff and or at a [facility] and bring journal in.  Should additionally have repeated A1c done with her history of prediabetes.  If requiring additional medication, consider ACE/ARB given her prediabetic history as above (however also in reproductive age).  However, patient has been interested in a "fluidlike" pill, thus could also consider chlorthalidone or indapamide as appropriate choices.  Allayne Stack, DO

## 2020-05-28 ENCOUNTER — Encounter: Payer: Self-pay | Admitting: Family Medicine

## 2020-06-06 ENCOUNTER — Ambulatory Visit: Payer: Medicaid Other | Admitting: Family Medicine

## 2020-06-11 DIAGNOSIS — M9903 Segmental and somatic dysfunction of lumbar region: Secondary | ICD-10-CM | POA: Diagnosis not present

## 2020-06-11 DIAGNOSIS — M546 Pain in thoracic spine: Secondary | ICD-10-CM | POA: Diagnosis not present

## 2020-06-11 DIAGNOSIS — M9904 Segmental and somatic dysfunction of sacral region: Secondary | ICD-10-CM | POA: Diagnosis not present

## 2020-06-11 DIAGNOSIS — M9902 Segmental and somatic dysfunction of thoracic region: Secondary | ICD-10-CM | POA: Diagnosis not present

## 2020-06-18 ENCOUNTER — Encounter: Payer: Self-pay | Admitting: Family Medicine

## 2020-06-18 ENCOUNTER — Ambulatory Visit (INDEPENDENT_AMBULATORY_CARE_PROVIDER_SITE_OTHER): Payer: Medicaid Other | Admitting: Family Medicine

## 2020-06-18 ENCOUNTER — Other Ambulatory Visit: Payer: Self-pay

## 2020-06-18 VITALS — BP 140/93 | HR 80 | Ht 63.0 in | Wt 263.2 lb

## 2020-06-18 DIAGNOSIS — R7303 Prediabetes: Secondary | ICD-10-CM

## 2020-06-18 DIAGNOSIS — M9904 Segmental and somatic dysfunction of sacral region: Secondary | ICD-10-CM | POA: Diagnosis not present

## 2020-06-18 DIAGNOSIS — M546 Pain in thoracic spine: Secondary | ICD-10-CM | POA: Diagnosis not present

## 2020-06-18 DIAGNOSIS — Z87898 Personal history of other specified conditions: Secondary | ICD-10-CM

## 2020-06-18 DIAGNOSIS — I10 Essential (primary) hypertension: Secondary | ICD-10-CM

## 2020-06-18 DIAGNOSIS — M9903 Segmental and somatic dysfunction of lumbar region: Secondary | ICD-10-CM | POA: Diagnosis not present

## 2020-06-18 DIAGNOSIS — M9902 Segmental and somatic dysfunction of thoracic region: Secondary | ICD-10-CM | POA: Diagnosis not present

## 2020-06-18 LAB — POCT GLYCOSYLATED HEMOGLOBIN (HGB A1C): Hemoglobin A1C: 5.5 % (ref 4.0–5.6)

## 2020-06-18 NOTE — Patient Instructions (Signed)
It was good to see you today.  Thank you for coming in.  Your A1c is down to 5.5, putting you below of the Prediabetes range.  Great job getting it down and keep upthe good work!  Your blood pressure was a little high.    Please come back in 1 month to see Dr. Annia Friendly to follow-up on your blood pressure, and discuss partially starting a new medication, and discuss things to help quit smoking.  Please bring your blood pressure cuff to your next appointment.  Be Well, Dr Pecola Leisure

## 2020-06-21 NOTE — Assessment & Plan Note (Addendum)
BP borderline Hypertension on re-measure which was 140/93 with current Amlodipine adherence.  Will hold off on adding 2nd medication at this time as patient wanting to try further lifestyle intervention to avoid this.  Indicated if still high at next visit we may want to start 2nd medication for tighter control. - follow up with PCP in 3 weeks to re-check BP - Discussed limiting high sodium foods, exercising and further decreasing smoking to bring down blood pressure - Informed patient that risks likely outweigh benefits of continuing to take Aspirin

## 2020-06-21 NOTE — Assessment & Plan Note (Addendum)
Praised patient on getting A1C down to 5.5, which is below Pre-Diabetes range and decreasing amount smoking. - Continue with good carb control and exercise - Discuss smoking cessation further and in more detail with PCP at next visit

## 2020-06-21 NOTE — Progress Notes (Signed)
    SUBJECTIVE:   CHIEF COMPLAINT / HPI: follow up BP/Diabetes  Patient here for follow-up on BP.  Indicates has been elevated when checking at home, but not checking very often an unsure of measurements.  Did not bring BP cuff to appointemnt today to check accuracy.  Does indicates she gets headaches and occasional dizziness, no changes in vision.  Taking Amlodipine daily.  Patient has cut back on smoking but still smoking part of a black and mild daily.  PERTINENT  PMH / PSH: Hypertension, Pre-Diabetes  OBJECTIVE:   BP (!) 140/93 Comment: bp machine  Pulse 80   Ht 5\' 3"  (1.6 m)   Wt 263 lb 3.2 oz (119.4 kg)   LMP 06/07/2020 (Exact Date)   SpO2 100%   BMI 46.62 kg/m    Physical Exam Constitutional:      Appearance: Normal appearance.  HENT:     Head: Normocephalic and atraumatic.     Mouth/Throat:     Mouth: Mucous membranes are moist.  Cardiovascular:     Rate and Rhythm: Normal rate and regular rhythm.  Pulmonary:     Effort: Pulmonary effort is normal.     Breath sounds: Normal breath sounds.  Abdominal:     General: Abdomen is flat. There is no distension.     Palpations: Abdomen is soft.     Tenderness: There is no abdominal tenderness.  Skin:    General: Skin is warm.  Neurological:     Mental Status: She is alert.     ASSESSMENT/PLAN:   Prediabetes Praised patient on getting A1C down to 5.5, which is below Pre-Diabetes range and decreasing amount smoking. - Continue with good carb control and exercise - Discuss smoking cessation further and in more detail with PCP at next visit  Essential hypertension BP borderline Hypertension on re-measure which was 140/93 with current Amlodipine adherence.  Will hold off on adding 2nd medication at this time as patient wanting to try further lifestyle intervention to avoid this.  Indicated if still high at next visit we may want to start 2nd medication for tighter control. - follow up with PCP in 3 weeks to re-check  BP - Discussed limiting high sodium foods, exercising and further decreasing smoking to bring down blood pressure - Informed patient that risks likely outweigh benefits of continuing to take Aspirin      06/09/2020, MD [facility]

## 2020-06-25 DIAGNOSIS — M9903 Segmental and somatic dysfunction of lumbar region: Secondary | ICD-10-CM | POA: Diagnosis not present

## 2020-06-25 DIAGNOSIS — M9904 Segmental and somatic dysfunction of sacral region: Secondary | ICD-10-CM | POA: Diagnosis not present

## 2020-06-25 DIAGNOSIS — M546 Pain in thoracic spine: Secondary | ICD-10-CM | POA: Diagnosis not present

## 2020-06-25 DIAGNOSIS — M9902 Segmental and somatic dysfunction of thoracic region: Secondary | ICD-10-CM | POA: Diagnosis not present

## 2020-07-09 ENCOUNTER — Ambulatory Visit: Payer: Medicaid Other | Admitting: Nurse Practitioner

## 2020-07-11 ENCOUNTER — Other Ambulatory Visit: Payer: Self-pay

## 2020-07-11 ENCOUNTER — Other Ambulatory Visit (HOSPITAL_COMMUNITY)
Admission: RE | Admit: 2020-07-11 | Discharge: 2020-07-11 | Disposition: A | Discharge status: Home / Self Care | Payer: Medicaid Other | Source: Ambulatory Visit | Attending: Family Medicine | Admitting: Family Medicine

## 2020-07-11 ENCOUNTER — Ambulatory Visit (INDEPENDENT_AMBULATORY_CARE_PROVIDER_SITE_OTHER): Payer: Medicaid Other

## 2020-07-11 VITALS — BP 151/97 | Temp 77.0°F | Wt 262.9 lb

## 2020-07-11 DIAGNOSIS — Z113 Encounter for screening for infections with a predominantly sexual mode of transmission: Secondary | ICD-10-CM | POA: Diagnosis not present

## 2020-07-11 NOTE — Patient Instructions (Signed)
AREA FAMILY PRACTICE PHYSICIANS  [place] ([id number]) . [facility] o [address] [place], [place] [address] o [phone] o Mon-Fri 8:30-12:30, 1:30-5:00 o Accepting Medicaid . [facility] o [address], [place], [place] [address] o [phone] o Mon-Fri 8:00-5:30 . [facility] o [address] [place], [place] [address] o [phone] o Mon, Tue, Thur, Fri 8:30-5:00, Wed 10:00-7:00 (closed 1-2pm) o Accepting Medicaid . [facility] o [address], [place], [place]  [address] o Phone - [phone]   Fax - [phone]  [place] ([address]) . [facility] o [address] [place], [place] [address] o [phone] o Mon-Fri 8:00-5:00 . [facility]  o [address] [place], [place] [address] o [phone] o Mon-Fri 8:30-5:30, Sat (Oct.-Mar.) 9:00-1:00 o Accepting Medicaid  [place] ([address]) . [facility] o [address], [place], [place] [address] o [phone] o Mon-Fri 8:00-5:00  [place] ([address]) . [facility] o [address], [place], [place] [address] o [phone] o Mon-Fri 8:00-5:00 . [facility] [place] o [address], [place], [place] [address] o [phone] o Mon-Fri 8:00-5:00 . [facility] o [address] [place], [place] [address] o [phone] o Mon-Fri 8:00-5:00 . [facility] o [address] [place] [address] o [phone] o Mon-Fri 7:30-5:30  [place] ([address] & [address]) . [facility] o [address] [place], [place] [address] o [phone] o Mon-Thur 8:00-6:00 o Accepting Medicaid . [facility] o [address] [place], [place] [address] o [phone] o Mon-Thur 7:30-7:30, Fri 7:30-4:30 o Accepting Medicaid . [facility] o [address], [place], [place]  [address] o [phone]   Fax - [phone]  [place] ([address] & [address]) . [facility] o [address] [place], [place] [address] o [phone] o Mon-Fri 7:00-5:00 . [facility] o [address], [place], [place] [address] o [phone] o Mon-Fri 8:00-5:00 o Accepting Medicaid . [facility] o [address], [place], [place] [address] o [phone] o Mon-Fri 8:00-5:00 o Accepting Medicaid  [place] ([address]) . [facility] [place] o [address] [place], [place] [address] o [phone] o Mon-Fri 8:00-5:00 . [facility] o [address], [place], [place] [address] o [phone] o Mon-Fri 8:00-5:00 o Accepting Medicaid . [facility] o [address], [place], [place] [address] o [phone] o Mon-Fri 8:00-5:30, Sat&Sun by appointment (phones open at 8:30) o Accepting Medicaid  [place] ([address]) . [facility] o [address] [place], [place] [address] o [phone] o Mon-Thur 8:00-7:00, Fri 8:00-5:00, Sat 8:00-12:00, Sun 9:00-12:00 o Accepting Medicaid . [facility] o [address], [place], [place] [address] o [phone] o Mon-Thur 8:00-5:00 o Accepting Medicaid . [facility] o [address] [place],  [address] o [phone] o Mon-Fri 8:00-5:30, Sat (Oct.-Mar.) 9:00-1:00 o Accepting Medicaid  [place]  ([address]) .   [facility] o [address], [place], [place] [address] o [phone] o Mon-Fri 8:00-5:00 o Accepting Medicaid   [place] ([address]) . [facility] [place] o [address], [place], [place] [address] o [phone] o Mon-Fri 8:00-5:00 . [facility] [place] o [address], [place], [place] [address] o [phone] o Mon-Fri 8:00-5:00 . [facility] [place] o [address] [place] [address], [place], [place] [address] o [phone] o Mon-Fri 8:00-5:00 o After hours clinic ([address] [place], [place] [address]) [phone] Mon-Fri 5:00-8:00, Sat 12:00-6:00, Sun 10:00-4:00 o Accepting Medicaid . [facility] [place] o [address], [place], [place]  [address] o [phone]   Fax - [phone]  [place] ([address]) . [facility] [place] o [address], [place], [place] [address] o [phone] o Mon-Fri 8:00-5:00 . [facility] [place] ([facility] [place] o [address], [place], [place] [address] o [phone] o Mon-Thur 8:00-7:00, Fri 8:00-5:00, Sat 8:00-12:00    

## 2020-07-11 NOTE — Progress Notes (Signed)
Pt here today for self swab for STD testing. Pt states has new partner and would like testing. Pt denies any vaginal discharge, odor, itching or abd cramps. Pt unsure of exposure to STDs. Pt states has unprotected intercourse 3/19.   Pt aware BP elevated at today's visit. Pt states has not taken BP meds this morning. Pt advised to take when returns home. Pt agreeable.   Pt missed annual exam on 07/09/20. Pt to reschedule today.   Self swab collected today. Pt advised results will take 24-48 hours and will see results in mychart and will be notified if needs further treatment. Pt verbalized understanding.   Judeth Cornfield, RN  07/11/20.

## 2020-07-12 LAB — CERVICOVAGINAL ANCILLARY ONLY
Bacterial Vaginitis (gardnerella): POSITIVE — AB
Candida Glabrata: NEGATIVE
Candida Vaginitis: NEGATIVE
Chlamydia: NEGATIVE
Comment: NEGATIVE
Comment: NEGATIVE
Comment: NEGATIVE
Comment: NEGATIVE
Comment: NEGATIVE
Comment: NORMAL
Neisseria Gonorrhea: NEGATIVE
Trichomonas: NEGATIVE

## 2020-07-14 ENCOUNTER — Other Ambulatory Visit: Payer: Self-pay

## 2020-07-14 ENCOUNTER — Emergency Department (HOSPITAL_COMMUNITY)
Admission: EM | Admit: 2020-07-14 | Discharge: 2020-07-14 | Disposition: A | Discharge status: Home / Self Care | Payer: Medicaid Other | Attending: Emergency Medicine | Admitting: Emergency Medicine

## 2020-07-14 ENCOUNTER — Encounter (HOSPITAL_COMMUNITY): Payer: Self-pay | Admitting: Emergency Medicine

## 2020-07-14 ENCOUNTER — Emergency Department (HOSPITAL_COMMUNITY): Payer: Medicaid Other

## 2020-07-14 DIAGNOSIS — I10 Essential (primary) hypertension: Secondary | ICD-10-CM | POA: Insufficient documentation

## 2020-07-14 DIAGNOSIS — S199XXA Unspecified injury of neck, initial encounter: Secondary | ICD-10-CM | POA: Diagnosis not present

## 2020-07-14 DIAGNOSIS — M545 Low back pain, unspecified: Secondary | ICD-10-CM | POA: Diagnosis not present

## 2020-07-14 DIAGNOSIS — F17210 Nicotine dependence, cigarettes, uncomplicated: Secondary | ICD-10-CM | POA: Insufficient documentation

## 2020-07-14 DIAGNOSIS — M4602 Spinal enthesopathy, cervical region: Secondary | ICD-10-CM | POA: Diagnosis not present

## 2020-07-14 DIAGNOSIS — M791 Myalgia, unspecified site: Secondary | ICD-10-CM | POA: Insufficient documentation

## 2020-07-14 DIAGNOSIS — Z79899 Other long term (current) drug therapy: Secondary | ICD-10-CM | POA: Insufficient documentation

## 2020-07-14 DIAGNOSIS — R0789 Other chest pain: Secondary | ICD-10-CM | POA: Insufficient documentation

## 2020-07-14 DIAGNOSIS — M542 Cervicalgia: Secondary | ICD-10-CM | POA: Diagnosis not present

## 2020-07-14 DIAGNOSIS — Z7982 Long term (current) use of aspirin: Secondary | ICD-10-CM | POA: Insufficient documentation

## 2020-07-14 DIAGNOSIS — Y92410 Unspecified street and highway as the place of occurrence of the external cause: Secondary | ICD-10-CM | POA: Diagnosis not present

## 2020-07-14 DIAGNOSIS — M778 Other enthesopathies, not elsewhere classified: Secondary | ICD-10-CM | POA: Diagnosis not present

## 2020-07-14 DIAGNOSIS — M47812 Spondylosis without myelopathy or radiculopathy, cervical region: Secondary | ICD-10-CM | POA: Diagnosis not present

## 2020-07-14 LAB — POC URINE PREG, ED: Preg Test, Ur: NEGATIVE

## 2020-07-14 MED ORDER — METHOCARBAMOL 500 MG PO TABS: 500.0000 mg | ORAL_TABLET | Freq: Two times a day (BID) | ORAL | 0 refills | Status: DC

## 2020-07-14 MED ORDER — IBUPROFEN 200 MG PO TABS
600.0000 mg | ORAL_TABLET | Freq: Once | ORAL | Status: AC
  Administered 2020-07-14: 600 mg via ORAL
  Filled 2020-07-14: qty 3

## 2020-07-14 MED ORDER — METHOCARBAMOL 500 MG PO TABS
500.0000 mg | ORAL_TABLET | Freq: Once | ORAL | Status: AC
  Administered 2020-07-14: 500 mg via ORAL
  Filled 2020-07-14: qty 1
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
## 2020-07-14 NOTE — Discharge Instructions (Addendum)
The pain you are experiencing is likely due to muscle strain, you may take Ibuprofen or Naprosyn and Robaxin as needed for pain management. Do not combine with any pain reliever other than tylenol.  You may also use ice and heat, and over-the-counter remedies such as Biofreeze gel or salon pas lidocaine patches. The muscle soreness should improve over the next week. Follow up with your family doctor in the next week for a recheck if you are still having symptoms. Return to ED if pain is worsening, you develop weakness or numbness of extremities, or new or concerning symptoms develop. ° °

## 2020-07-15 ENCOUNTER — Telehealth: Payer: Self-pay | Admitting: *Deleted

## 2020-07-15 NOTE — Telephone Encounter (Signed)
Transition Care Management Unsuccessful Follow-up Telephone Call  Date of discharge and from where:  07/14/2020 - [facility]  Attempts:  1st Attempt  Reason for unsuccessful TCM follow-up call:  Left voice message 

## 2020-07-16 ENCOUNTER — Other Ambulatory Visit: Payer: Self-pay

## 2020-07-16 DIAGNOSIS — B9689 Other specified bacterial agents as the cause of diseases classified elsewhere: Secondary | ICD-10-CM

## 2020-07-16 MED ORDER — METRONIDAZOLE 500 MG PO TABS: 500.0000 mg | ORAL_TABLET | Freq: Two times a day (BID) | ORAL | 0 refills | Status: DC

## 2020-07-16 NOTE — Telephone Encounter (Signed)
Transition Care Management Follow-up Telephone Call  Date of discharge and from where: 07/14/2020 - [facility]  How have you been since you were released from the hospital? "Still a little sore but it's better than it was"  Any questions or concerns? No  Items Reviewed:  Did the pt receive and understand the discharge instructions provided? Yes   Medications obtained and verified? Yes   Other? No   Any new allergies since your discharge? No   Dietary orders reviewed? No  Do you have support at home? Yes    Functional Questionnaire: (I = Independent and D = Dependent) ADLs: I  Bathing/Dressing- I  Meal Prep- I  Eating- I  Maintaining continence- I  Transferring/Ambulation- I  Managing Meds- I  Follow up appointments reviewed:   PCP Hospital f/u appt confirmed? No    Specialist Hospital f/u appt confirmed? No    Are transportation arrangements needed? No   If their condition worsens, is the pt aware to call PCP or go to the Emergency Dept.? Yes  Was the patient provided with contact information for the PCP's office or ED? Yes  Was to pt encouraged to call back with questions or concerns? Yes

## 2020-07-25 ENCOUNTER — Other Ambulatory Visit: Payer: Self-pay

## 2020-07-25 DIAGNOSIS — I10 Essential (primary) hypertension: Secondary | ICD-10-CM

## 2020-07-25 MED ORDER — AMLODIPINE BESYLATE 10 MG PO TABS: 10.0000 mg | ORAL_TABLET | Freq: Every day | ORAL | 2 refills | Status: DC

## 2020-08-12 ENCOUNTER — Other Ambulatory Visit: Payer: Self-pay

## 2020-08-12 ENCOUNTER — Other Ambulatory Visit (HOSPITAL_COMMUNITY)
Admission: RE | Admit: 2020-08-12 | Discharge: 2020-08-12 | Disposition: A | Discharge status: Home / Self Care | Payer: Medicaid Other | Source: Ambulatory Visit | Attending: Nurse Practitioner | Admitting: Nurse Practitioner

## 2020-08-12 ENCOUNTER — Ambulatory Visit (INDEPENDENT_AMBULATORY_CARE_PROVIDER_SITE_OTHER): Payer: Medicaid Other | Admitting: Advanced Practice Midwife

## 2020-08-12 VITALS — BP 130/78 | HR 87 | Wt 272.0 lb

## 2020-08-12 DIAGNOSIS — Z0000 Encounter for general adult medical examination without abnormal findings: Secondary | ICD-10-CM | POA: Diagnosis not present

## 2020-08-12 DIAGNOSIS — N898 Other specified noninflammatory disorders of vagina: Secondary | ICD-10-CM | POA: Diagnosis not present

## 2020-08-12 MED ORDER — FLUCONAZOLE 150 MG PO TABS: 150.0000 mg | ORAL_TABLET | Freq: Once | ORAL | 0 refills | Status: AC

## 2020-08-12 NOTE — Patient Instructions (Signed)
LACTOBACILLUS or ACIDOPHILUS supplement

## 2020-08-13 ENCOUNTER — Encounter: Payer: Self-pay | Admitting: Advanced Practice Midwife

## 2020-08-13 ENCOUNTER — Telehealth: Payer: Self-pay

## 2020-08-13 DIAGNOSIS — A549 Gonococcal infection, unspecified: Secondary | ICD-10-CM | POA: Insufficient documentation

## 2020-08-13 LAB — CERVICOVAGINAL ANCILLARY ONLY
Bacterial Vaginitis (gardnerella): NEGATIVE
Candida Glabrata: NEGATIVE
Candida Vaginitis: POSITIVE — AB
Chlamydia: NEGATIVE
Comment: NEGATIVE
Comment: NEGATIVE
Comment: NEGATIVE
Comment: NEGATIVE
Comment: NEGATIVE
Comment: NORMAL
Neisseria Gonorrhea: POSITIVE — AB
Trichomonas: NEGATIVE

## 2020-08-13 LAB — POCT URINALYSIS DIP (DEVICE)
Bilirubin Urine: NEGATIVE
Glucose, UA: NEGATIVE mg/dL
Ketones, ur: NEGATIVE mg/dL
Leukocytes,Ua: NEGATIVE
Nitrite: NEGATIVE
Protein, ur: NEGATIVE mg/dL
Specific Gravity, Urine: >=1.03 (ref 1.005–1.030)
Urobilinogen, UA: 0.2 mg/dL (ref 0.0–1.0)
pH: 5.5 (ref 5.0–8.0)

## 2020-08-13 NOTE — Progress Notes (Signed)
GYNECOLOGY PROBLEM NOTE  History:     Julie Hendrix is a 35 y.o. [phone] female. She originally presented for her annual appointment including pap but started her period today. She c/o recurrent Bacterial Vaginosis. She was prescribed Metrogel BID but has not been consistent with use. She endorse new female partner, +penetrative sex. Denies abnormal vaginal bleeding, pelvic pain, problems with intercourse or other gynecologic concerns.    Gynecologic History Patient's last menstrual period was 08/11/2020 (exact date). Last Pap: 06/20/2019. Results were: normal with negative HPV   Obstetric History OB History  Gravida Para Term Preterm AB Living  3 2 2  0 1 2  SAB IAB Ectopic Multiple Live Births  0 1 0 0 2    # Outcome Date GA Lbr Len/2nd Weight Sex Delivery Anes PTL Lv  3 Term 06/16/03 [redacted]w[redacted]d   M Vag-Spont   LIV  2 Term 10/05/98 [redacted]w[redacted]d   F    LIV  1 IAB             Past Medical History:  Diagnosis Date  . Abnormal Pap smear   . 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 Dysplasia of cervix, low grade (CIN 1)   . HPV (human papilloma virus) anogenital infection     Past Surgical History:  Procedure Laterality Date  . COLPOSCOPY    . CRYOTHERAPY  06/08/2017   cervix    Current Outpatient Medications on File Prior to Visit  Medication Sig Dispense Refill  . amLODipine (NORVASC) 10 MG tablet Take 1 tablet (10 mg total) by mouth daily. 90 tablet 2  . aspirin EC 81 MG tablet Take 81 mg by mouth daily.    . metroNIDAZOLE (METROGEL) 0.75 % vaginal gel Place vaginally 3 (three) times a week.    . methocarbamol (ROBAXIN) 500 MG tablet Take 1 tablet (500 mg total) by mouth 2 (two) times daily. (Patient not taking: Reported on 08/12/2020) 20 tablet 0  . metroNIDAZOLE (FLAGYL) 500 MG tablet Take 1 tablet (500 mg total) by mouth 2 (two) times daily. 14 tablet 0  . Multiple Vitamin (MULTIVITAMIN WITH MINERALS) TABS tablet Take 1  tablet by mouth daily. (Patient not taking: Reported on 08/12/2020)    . [DISCONTINUED] dicyclomine (BENTYL) 10 MG capsule Take 1 capsule (10 mg total) by mouth 4 (four) times daily -  before meals and at bedtime. 120 capsule 1  . [DISCONTINUED] omeprazole (PRILOSEC) 20 MG capsule Take 1 capsule (20 mg total) by mouth daily. 30 capsule 0   No current facility-administered medications on file prior to visit.    No Known Allergies  Social History:  reports that she has been smoking cigarettes and cigars. She has been smoking about 0.25 packs per day. She has never used smokeless tobacco. She reports current alcohol use of about 9.0 standard drinks of alcohol per week. She reports that she does not use drugs.  Family History  Problem Relation Age of Onset  . Hypertension Father   . Diabetes Father   . Heart attack Father   . Hypertension Mother     The following portions of the patient's history were reviewed and updated as appropriate: allergies, current medications, past family history, past medical history, past social history, past surgical history and problem list.  Review of Systems Pertinent items noted in HPI and remainder of comprehensive ROS otherwise negative.  Physical Exam:  BP 130/78   Pulse 87   Wt 272  lb (123.4 kg)   LMP 08/11/2020 (Exact Date)   BMI 48.18 kg/m  CONSTITUTIONAL: Well-developed, well-nourished female in no acute distress.  HENT:  Normocephalic, atraumatic, External right and left ear normal.  EYES: Conjunctivae and EOM are normal. Pupils are equal, round, and reactive to light. No scleral icterus.  NECK: Normal range of motion, supple, no masses.  Normal thyroid.  SKIN: Skin is warm and dry. No rash noted. Not diaphoretic. No erythema. No pallor. MUSCULOSKELETAL: Normal range of motion. No tenderness.  No cyanosis, clubbing, or edema. NEUROLOGIC: Alert and oriented to person, place, and time. Normal reflexes, muscle tone coordination.  PSYCHIATRIC:  Normal mood and affect. Normal behavior. Normal judgment and thought content. CARDIOVASCULAR: Normal heart rate noted, regular rhythm RESPIRATORY: Effort normal   Assessment and Plan:    1. Vaginal discharge - Self swab today - Discussed daily probiotic supplement - Restart Metrogel as previously prescribed - Cervicovaginal ancillary only( )  2. Encounter for well woman exam without gynecological exam - well woman deferred due to starting period, moderate bleeding - Patient not due for pap, prefers annual screening, may return at her convenience   Routine preventative health maintenance measures emphasized. Please refer to After Visit Summary for other counseling recommendations.      Clayton Bibles, MSN, CNM Certified Nurse Midwife, Biochemist, clinical for [employer], [facility]

## 2020-08-13 NOTE — Telephone Encounter (Signed)
-----   Message from Calvert Cantor, [place] sent at 08/13/2020  2:18 PM EDT ----- + Gonorrhea based on swab yesterday. Please notify and coordinate treatment. Thanks! SW, CNM

## 2020-08-13 NOTE — Telephone Encounter (Signed)
Called pt about her positive gonorrhea test.  I advised pt to make sure that her partner(s) get treated as well.  After treatment if she could please abstain from intercourse two weeks after both of them have been treated.  Pt informed me that she would be able to come in tomorrow 08/14/20 at 1500 for treatment.  Front office notified.   Addison Naegeli, RN  08/13/20

## 2020-08-14 ENCOUNTER — Other Ambulatory Visit: Payer: Self-pay

## 2020-08-14 ENCOUNTER — Ambulatory Visit (INDEPENDENT_AMBULATORY_CARE_PROVIDER_SITE_OTHER): Payer: Medicaid Other

## 2020-08-14 VITALS — BP 146/99 | HR 67 | Wt 272.0 lb

## 2020-08-14 DIAGNOSIS — A549 Gonococcal infection, unspecified: Secondary | ICD-10-CM | POA: Diagnosis not present

## 2020-08-14 MED ORDER — CEFTRIAXONE SODIUM 500 MG IJ SOLR
500.0000 mg | Freq: Once | INTRAMUSCULAR | Status: AC
  Administered 2020-08-14: 500 mg via INTRAMUSCULAR

## 2020-08-14 NOTE — Progress Notes (Signed)
Pt here today for Gonorrhea treatment. Pt tested positive on 08/12/20.  Pt given Rocephin 500 mg IM x 1 in RUOQ. Pt tolerated well. Pt advised to remain abstinent until all partners treated x 1 week. Pt then advised to use condoms for STD prevention. Pt agreeable and verbalized understanding.   Pt aware of elevated BP at office today. Advised to follow up with PCP. Pt verbalized understanding.   Judeth Cornfield, RN  08/14/20

## 2020-08-20 ENCOUNTER — Encounter: Payer: Self-pay | Admitting: *Deleted

## 2020-08-22 ENCOUNTER — Ambulatory Visit: Payer: Medicaid Other | Admitting: Family Medicine
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
## 2020-08-23 ENCOUNTER — Other Ambulatory Visit: Payer: Self-pay

## 2020-08-23 ENCOUNTER — Ambulatory Visit (INDEPENDENT_AMBULATORY_CARE_PROVIDER_SITE_OTHER): Payer: Medicaid Other | Admitting: Family Medicine

## 2020-08-23 ENCOUNTER — Encounter: Payer: Self-pay | Admitting: Family Medicine

## 2020-08-23 VITALS — BP 123/75 | HR 76 | Ht 63.0 in | Wt 270.0 lb

## 2020-08-23 DIAGNOSIS — M545 Low back pain, unspecified: Secondary | ICD-10-CM

## 2020-08-23 MED ORDER — IBUPROFEN 600 MG PO TABS: 600.0000 mg | ORAL_TABLET | Freq: Three times a day (TID) | ORAL | 0 refills | Status: DC | PRN

## 2020-08-23 MED ORDER — METHOCARBAMOL 500 MG PO TABS: 500.0000 mg | ORAL_TABLET | Freq: Four times a day (QID) | ORAL | 0 refills | Status: AC

## 2020-08-23 NOTE — Patient Instructions (Signed)
It was great seeing you today!  Today you came in for your back pain and I submitted a referral for physical therapy, and placed a prescription for Robaxin and Ibuprofen.  If symptoms worsen please return to the clinic. Also you can schedule an appointment at the front desk for a nutrition visit if you would like.   Visit Reminders: - Stop by the pharmacy to pick up your prescriptions  - Continue to work on your healthy eating habits and incorporating exercise into your daily life.    Feel free to call with any questions or concerns at any time, at [phone].   Take care,  Dr. Cora Collum [facility]

## 2020-09-04 ENCOUNTER — Other Ambulatory Visit: Payer: Self-pay

## 2020-09-04 ENCOUNTER — Encounter: Payer: Self-pay | Admitting: Physical Therapy

## 2020-09-04 ENCOUNTER — Ambulatory Visit: Payer: Medicaid Other | Attending: Family Medicine | Admitting: Physical Therapy

## 2020-09-04 DIAGNOSIS — M542 Cervicalgia: Secondary | ICD-10-CM | POA: Insufficient documentation

## 2020-09-04 DIAGNOSIS — M546 Pain in thoracic spine: Secondary | ICD-10-CM | POA: Insufficient documentation

## 2020-09-04 DIAGNOSIS — M545 Low back pain, unspecified: Secondary | ICD-10-CM | POA: Diagnosis present
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
## 2020-09-05 ENCOUNTER — Ambulatory Visit (INDEPENDENT_AMBULATORY_CARE_PROVIDER_SITE_OTHER): Payer: Medicaid Other | Admitting: *Deleted

## 2020-09-05 ENCOUNTER — Other Ambulatory Visit (HOSPITAL_COMMUNITY)
Admission: RE | Admit: 2020-09-05 | Discharge: 2020-09-05 | Disposition: A | Discharge status: Home / Self Care | Payer: Medicaid Other | Source: Ambulatory Visit | Attending: Family Medicine | Admitting: Family Medicine

## 2020-09-05 ENCOUNTER — Encounter: Payer: Self-pay | Admitting: *Deleted

## 2020-09-05 VITALS — BP 133/84 | HR 79 | Ht 63.0 in | Wt 271.2 lb

## 2020-09-05 DIAGNOSIS — N898 Other specified noninflammatory disorders of vagina: Secondary | ICD-10-CM | POA: Diagnosis not present

## 2020-09-05 NOTE — Progress Notes (Signed)
Patient was assessed and managed by nursing staff during this encounter. I have reviewed the chart and agree with the documentation and plan. I have also made any necessary editorial changes.  Sabria Florido A Cianni Manny, MD 09/05/2020 11:40 AM   

## 2020-09-05 NOTE — Patient Instructions (Signed)
Clayton Bibles, CNM recommended taking lactobacillus or acidophilus supplement for BV prevention

## 2020-09-05 NOTE — Progress Notes (Signed)
Here for nurse visit for self swab. States was treated for Gonorrhea 08/14/20. C/o white vaginal discharge and bad fishy smell. Wants to do self swab for bv, gc, yeast to make sure gonorrhea gone and if she has yeast or bv again. States is taking metrogel about twice a week per recommendations. Advised take probiotics and other BV preventative measures. Advised will treat according to results she voices understanding. Wrigley Winborne,RN

## 2020-09-06 LAB — CERVICOVAGINAL ANCILLARY ONLY
Bacterial Vaginitis (gardnerella): POSITIVE — AB
Candida Glabrata: NEGATIVE
Candida Vaginitis: NEGATIVE
Chlamydia: NEGATIVE
Comment: NEGATIVE
Comment: NEGATIVE
Comment: NEGATIVE
Comment: NEGATIVE
Comment: NEGATIVE
Comment: NORMAL
Neisseria Gonorrhea: NEGATIVE
Trichomonas: NEGATIVE

## 2020-09-10 ENCOUNTER — Telehealth: Payer: Self-pay

## 2020-09-10 DIAGNOSIS — B9689 Other specified bacterial agents as the cause of diseases classified elsewhere: Secondary | ICD-10-CM

## 2020-09-10 MED ORDER — METRONIDAZOLE 0.75 % VA GEL
1.0000 | Freq: Two times a day (BID) | VAGINAL | 0 refills | Status: DC
Start: 2020-09-10 — End: 2021-01-15

## 2020-09-10 NOTE — Telephone Encounter (Addendum)
-----   Message from Warden Fillers, MD sent at 09/10/2020 11:11 AM EDT ----- Vaginal swab showed BV will offer treatment   Called pt with results. Pt reports using Metrogel at home for 2 days, this was gel leftover from prior infection. States foul odor is improving. Requests Metrogel rx instead of PO Flagyl due to Flagyl causing nausea. Metrogel ordered. Explained to pt that insurance may not cover this medication.

## 2020-09-11 ENCOUNTER — Telehealth: Payer: Self-pay

## 2020-09-11 ENCOUNTER — Ambulatory Visit: Payer: Medicaid Other

## 2020-09-11 NOTE — Telephone Encounter (Signed)
Spoke with patient regarding her 1st no-show. She demonstrated remorse for missing her appointment and ensured the PT she would not miss again. Her next appointment was confirmed and the pt verbally agreed. She was also reminded of the clinic's no-show policy, which she reported understanding.

## 2020-09-18 ENCOUNTER — Encounter: Payer: Self-pay | Admitting: Physical Therapy

## 2020-09-18 ENCOUNTER — Ambulatory Visit: Payer: Medicaid Other | Attending: Family Medicine | Admitting: Physical Therapy

## 2020-09-18 ENCOUNTER — Other Ambulatory Visit: Payer: Self-pay

## 2020-09-18 DIAGNOSIS — M546 Pain in thoracic spine: Secondary | ICD-10-CM

## 2020-09-18 DIAGNOSIS — M542 Cervicalgia: Secondary | ICD-10-CM | POA: Diagnosis not present

## 2020-09-18 DIAGNOSIS — M545 Low back pain, unspecified: Secondary | ICD-10-CM

## 2020-09-18 NOTE — Patient Instructions (Signed)
Access Code: [email or web address] URL: [email or web address] Date: 09/18/2020 Prepared by: Myrla Halsted  NEW Exercises  Seated Levator Scapulae Stretch - 1 x daily - 7 x weekly - 3 sets - 20sec hold Standing Quadratus Lumborum Stretch with Doorway - 1 x daily - 7 x weekly - 3 sets - 20sec hold Seated Piriformis Stretch with Trunk Bend - 1 x daily - 7 x weekly - 3 sets - 20sec hold

## 2020-09-18 NOTE — Therapy (Signed)
[facility] [address] [place], [place], [address] Phone: [phone]   Fax:  [phone]  Physical Therapy Treatment  Patient Details  Name: Julie Hendrix MRN: [id number] Date of Birth: 10/11/1985 Referring Provider (PT): McDiarmid, Tawanna Cooler   Encounter Date: 09/18/2020   PT End of Session - 09/18/20 1914    Visit Number 2    Number of Visits 6    Date for PT Re-Evaluation 10/16/20    Authorization Type [employer]    Progress Note Due on Visit 10    PT Start Time 1620    PT Stop Time 1700    PT Time Calculation (min) 40 min    Activity Tolerance Patient tolerated treatment well;No increased pain    Behavior During Therapy WFL for tasks assessed/performed           Past Medical History:  Diagnosis Date  . Abnormal Pap smear   . 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 Dysplasia of cervix, low grade (CIN 1)   . HPV (human papilloma virus) anogenital infection     Past Surgical History:  Procedure Laterality Date  . COLPOSCOPY    . CRYOTHERAPY  06/08/2017   cervix    There were no vitals filed for this visit.   Subjective Assessment - 09/18/20 1626    Subjective Patient states she got a massage and "it was good". Patient reports good compliance with HEP.    Limitations Standing;Other (comment);House hold activities;Lifting    Currently in Pain? Yes    Pain Score 4     Pain Location Back    Pain Orientation Lower;Lateral    Pain Descriptors / Indicators Aching    Pain Type Chronic pain    Pain Radiating Towards Not radiating lately.    Multiple Pain Sites Yes    Pain Score 4    Pain Location Neck    Pain Orientation Mid    Pain Descriptors / Indicators Aching              [facility] PT Assessment - 09/18/20 0001      Assessment   Medical Diagnosis LBP M54.50    Referring Provider (PT) McDiarmid, Jacqlyn Larsen Dominance Right    Next MD Visit PRN                          [facility] Adult PT Treatment/Exercise - 09/18/20 0001      Neck Exercises: Seated   Neck Retraction 10 reps    Neck Retraction Limitations with elevation      Lumbar Exercises: Stretches   Piriformis Stretch 20 seconds    Piriformis Stretch Limitations Seated x2ea side    Other Lumbar Stretch Exercise Standing QL stretch 20sec x2 ea side      Lumbar Exercises: Standing   Other Standing Lumbar Exercises Pallof Press GREEN x2ea side      Lumbar Exercises: Supine   Bridge 10 reps    Bridge with [employer] 10 reps    Bridge with clamshell 10 reps      Manual Therapy   Manual Therapy Soft tissue mobilization    Soft tissue mobilization cervical/thoracic/lumbar paraspinals, UT/levator      Neck Exercises: Stretches   Levator Stretch 20 seconds;3 reps                  PT Education - 09/18/20 1913  Education Details Additions to HEP for levator stretch, piriformis stretch and QL stretch.    Person(s) Educated Patient    Methods Demonstration;Verbal cues;Handout    Comprehension Verbalized understanding;Returned demonstration;Verbal cues required            PT Short Term Goals - 09/18/20 1918      PT SHORT TERM GOAL #1   Title Patient will be independent with initial HEP to improve trunk mobility and reduce pain.    Baseline no exercise program    Time 2    Period Weeks    Status Achieved    Target Date 09/18/20             PT Long Term Goals - 09/04/20 2113      PT LONG TERM GOAL #1   Title Patient will be independent with final HEP and progression for continued improvement of mobility.    Baseline no exercise program    Time 6    Period Weeks    Status New    Target Date 10/16/20      PT LONG TERM GOAL #2   Title Patient will improve fwd flex to withing 5 cm of finger tips to floor with no more than 2/10 pain.    Baseline 14cm finger tips to floor.    Time 6    Period Weeks    Status New    Target Date  10/16/20      PT LONG TERM GOAL #3   Title Patient will be able to lift 8 pound object with proper body mechanics and pain no more than 2/10 pain to improve household chores.    Baseline Patient reports as much as 7/10 pain with household chores.    Time 6    Period Weeks    Status New    Target Date 10/16/20                 Plan - 09/18/20 1704    Clinical Impression Statement Patient reports overall decrease in typical pain as well as slight decrease in max pain with activity.  Patient continues to be challenged by tight trunk musculature and decreased core stability. Patient will benefit from continued skilled PT to address deficits and maximzie daily tasks with decreased pain.    Comorbidities HTN    Examination-Activity Limitations Stand;Bend;Bed Mobility    PT Treatment/Interventions ADLs/Self Care Home Management;Cryotherapy;Therapeutic activities;Therapeutic exercise;Neuromuscular re-education;Patient/family education;Manual techniques;Passive range of motion;Taping;Dry needling;Spinal Manipulations    PT Next Visit Plan Extension assessment, core stability    PT Home Exercise Plan [email or web address]    Consulted and Agree with Plan of Care Patient           Patient will benefit from skilled therapeutic intervention in order to improve the following deficits and impairments:  Decreased mobility,Decreased range of motion,Pain  Visit Diagnosis: Acute bilateral low back pain without sciatica  Pain in thoracic spine  Cervicalgia     Problem List Patient Active Problem List   Diagnosis Date Noted  . Gonorrhea 08/13/2020  . Back pain 09/06/2019  . Abdominal cramping 08/09/2019  . Gastroesophageal reflux disease 06/06/2019  . Missed period 06/06/2019  . Smoker 02/18/2019  . Prediabetes 06/17/2018  . Mumps without complication 05/18/2018  . Essential hypertension 05/18/2018  . Morbid obesity with body mass index (BMI) of 45.0 to 49.9 in adult [facility]) 11/30/2017  . PCOS  (polycystic ovarian syndrome) 11/30/2017  . Low grade squamous intraepithelial lesion on cytologic smear of cervix (LGSIL) 05/21/2017  .  Breast lump on right side at 10 o'clock position 01/31/2013    Myrla Halsted, PT 09/18/2020, 7:20 PM  [facility] [address] [place], [place], [address] Phone: [phone]   Fax:  [phone]  Name: Julie Hendrix MRN: [id number] Date of Birth: Jun 28, 1985

## 2020-09-25 ENCOUNTER — Ambulatory Visit: Payer: Medicaid Other

## 2020-09-25 ENCOUNTER — Other Ambulatory Visit: Payer: Self-pay

## 2020-09-25 DIAGNOSIS — M545 Low back pain, unspecified: Secondary | ICD-10-CM | POA: Diagnosis not present

## 2020-09-25 DIAGNOSIS — M542 Cervicalgia: Secondary | ICD-10-CM

## 2020-09-25 DIAGNOSIS — M546 Pain in thoracic spine: Secondary | ICD-10-CM | POA: Diagnosis not present

## 2020-09-25 NOTE — Patient Instructions (Signed)
  [id number]

## 2020-09-25 NOTE — Therapy (Signed)
[facility] [address] [place], [place], [address] Phone: [phone]   Fax:  [phone]  Physical Therapy Treatment  Patient Details  Name: Julie Hendrix MRN: [id number] Date of Birth: 08/20/1985 Referring Provider (PT): McDiarmid, Tawanna Cooler   Encounter Date: 09/25/2020   PT End of Session - 09/25/20 1702    Visit Number 3    Number of Visits 6    Date for PT Re-Evaluation 10/16/20    Authorization Type [employer]    Progress Note Due on Visit 10    PT Start Time 1620    PT Stop Time 1705    PT Time Calculation (min) 45 min    Activity Tolerance Patient tolerated treatment well;No increased pain    Behavior During Therapy WFL for tasks assessed/performed           Past Medical History:  Diagnosis Date  . Abnormal Pap smear   . 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 Dysplasia of cervix, low grade (CIN 1)   . HPV (human papilloma virus) anogenital infection     Past Surgical History:  Procedure Laterality Date  . COLPOSCOPY    . CRYOTHERAPY  06/08/2017   cervix    There were no vitals filed for this visit.   Subjective Assessment - 09/25/20 1622    Subjective Pt reports N/T in the back of both of her thighs yesterday, although she is not experiencing these symptoms today. She reports no soreness after her last visit and does not attribute her new symptoms to any exercises she has performed, but states they came on out of nowhere. She reports no pain today and adds that she has been adherent to her HEP.    Limitations Standing;Other (comment);House hold activities;Lifting    How long can you stand comfortably?    Patient Stated Goals Make back feel better.    Currently in Pain? Yes    Pain Score 0-No pain    Pain Location Back    Pain Orientation Lower    Pain Descriptors / Indicators Aching    Pain Type Chronic pain    Pain Radiating Towards BIL  hips, posterior thighs    Pain Onset More than a month ago    Pain Frequency Intermittent    Pain Score 0    Pain Location Neck    Pain Orientation Mid    Pain Descriptors / Indicators Aching    Pain Type Chronic pain    Pain Onset More than a month ago    Pain Frequency Intermittent              [facility] PT Assessment - 09/25/20 0001      Palpation   Palpation comment CPA's hypomobile and painful at C3-C7, T1-T4, T10-L4                         [facility] Adult PT Treatment/Exercise - 09/25/20 0001      Lumbar Exercises: Standing   Other Standing Lumbar Exercises Lumbar rotation at cable column 2x10 BIL 7#      Lumbar Exercises: Supine   Other Supine Lumbar Exercises Dead bugs with green physioball 2x10      Modalities   Modalities Moist Heat      Moist Heat Therapy   Number Minutes Moist Heat 10 Minutes    Moist Heat Location Lumbar Spine      Manual Therapy  Joint Mobilization Sidelying lumbar Grade V thrust manipulation x1 each side with cavitation on R      Neck Exercises: Stretches   Other Neck Stretches SNAG's x30 sec BIL                  PT Education - 09/25/20 1702    Education Details Pt educated on proper form when performing dead bugs, SNAGs, and lumbar rotations.    Person(s) Educated Patient    Methods Explanation;Demonstration;Verbal cues;Handout    Comprehension Returned demonstration;Verbal cues required;Verbalized understanding            PT Short Term Goals - 09/18/20 1918      PT SHORT TERM GOAL #1   Title Patient will be independent with initial HEP to improve trunk mobility and reduce pain.    Baseline no exercise program    Time 2    Period Weeks    Status Achieved    Target Date 09/18/20             PT Long Term Goals - 09/04/20 2113      PT LONG TERM GOAL #1   Title Patient will be independent with final HEP and progression for continued improvement of mobility.    Baseline no exercise program    Time 6     Period Weeks    Status New    Target Date 10/16/20      PT LONG TERM GOAL #2   Title Patient will improve fwd flex to withing 5 cm of finger tips to floor with no more than 2/10 pain.    Baseline 14cm finger tips to floor.    Time 6    Period Weeks    Status New    Target Date 10/16/20      PT LONG TERM GOAL #3   Title Patient will be able to lift 8 pound object with proper body mechanics and pain no more than 2/10 pain to improve household chores.    Baseline Patient reports as much as 7/10 pain with household chores.    Time 6    Period Weeks    Status New    Target Date 10/16/20                 Plan - 09/25/20 1703    Clinical Impression Statement Pt responded well to all treatment today, reporting a therapeutic response to moist heat, as well as SNAGs. She reports a neutral response to lumbar manipulation. She defers CT manipulation at this time due to apprehension. She will continue to benefit from skilled PT with the goal of core and LE strengthening in order to return to her prior level of function without limitation.    Personal Factors and Comorbidities Comorbidity 1    Comorbidities HTN    Examination-Activity Limitations Stand;Bend;Bed Mobility    Examination-Participation Restrictions Occupation    Stability/Clinical Decision Making Stable/Uncomplicated    Clinical Decision Making Low    Rehab Potential Good    PT Frequency 1x / week    PT Duration 6 weeks    PT Treatment/Interventions ADLs/Self Care Home Management;Cryotherapy;Therapeutic activities;Therapeutic exercise;Neuromuscular re-education;Patient/family education;Manual techniques;Passive range of motion;Taping;Dry needling;Spinal Manipulations    PT Next Visit Plan Continue core, LE strengthening progression, manual therapy. Consider trialing spinal traction for pain reduction.    PT Home Exercise Plan [id number]    Consulted and Agree with Plan of Care Patient           Patient will benefit  from skilled therapeutic intervention in order to improve the following deficits and impairments:  Decreased mobility,Decreased range of motion,Pain  Visit Diagnosis: Acute bilateral low back pain without sciatica  Pain in thoracic spine  Cervicalgia     Problem List Patient Active Problem List   Diagnosis Date Noted  . Gonorrhea 08/13/2020  . Back pain 09/06/2019  . Abdominal cramping 08/09/2019  . Gastroesophageal reflux disease 06/06/2019  . Missed period 06/06/2019  . Smoker 02/18/2019  . Prediabetes 06/17/2018  . Mumps without complication 05/18/2018  . Essential hypertension 05/18/2018  . Morbid obesity with body mass index (BMI) of 45.0 to 49.9 in adult [facility]) 11/30/2017  . PCOS (polycystic ovarian syndrome) 11/30/2017  . Low grade squamous intraepithelial lesion on cytologic smear of cervix (LGSIL) 05/21/2017  . Breast lump on right side at 10 o'clock position 01/31/2013    Carmelina Dane, PT, DPT 09/25/20 5:07 PM   [facility] [address] [place], [place], [address] Phone: [phone]   Fax:  [phone]  Name: Julie Hendrix MRN: [id number] Date of Birth: [birth year]

## 2020-10-02 ENCOUNTER — Other Ambulatory Visit: Payer: Self-pay

## 2020-10-02 ENCOUNTER — Encounter: Payer: Self-pay | Admitting: Physical Therapy

## 2020-10-02 ENCOUNTER — Ambulatory Visit: Payer: Medicaid Other | Admitting: Physical Therapy

## 2020-10-02 DIAGNOSIS — M546 Pain in thoracic spine: Secondary | ICD-10-CM

## 2020-10-02 DIAGNOSIS — M542 Cervicalgia: Secondary | ICD-10-CM

## 2020-10-02 DIAGNOSIS — M545 Low back pain, unspecified: Secondary | ICD-10-CM | POA: Diagnosis not present

## 2020-10-02 NOTE — Therapy (Signed)
[facility] [address] [place], [place], [address] Phone: [phone]   Fax:  [phone]  Physical Therapy Treatment  Patient Details  Name: Julie Hendrix MRN: [id number] Date of Birth: 06-Jun-1985 Referring Provider (PT): McDiarmid, Tawanna Cooler   Encounter Date: 10/02/2020   PT End of Session - 10/02/20 1626     Visit Number 4    Number of Visits 6    Date for PT Re-Evaluation 10/16/20    Authorization Type [employer]    PT Start Time 1620    PT Stop Time 1700    PT Time Calculation (min) 40 min    Activity Tolerance Patient tolerated treatment well;No increased pain    Behavior During Therapy WFL for tasks assessed/performed             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   Subjective Assessment - 10/02/20 1624     Subjective I'm feeling pretty good. 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    Limitations Standing;Other (comment);House hold activities;Lifting    Pain Score 5     Pain Location Back    Pain Orientation Lower    Pain Descriptors / Indicators Aching    Pain Type Chronic pain    Pain Radiating Towards B hips, post thighs                [facility] PT Assessment - 10/02/20 0001       Assessment   Medical Diagnosis LBP M54.50    Referring Provider (PT) McDiarmid, Todd      Precautions   Precautions None                           [facility] Adult PT Treatment/Exercise - 10/02/20 0001       Lumbar Exercises: Stretches   Other Lumbar Stretch Exercise seated Ball  roll out      Lumbar Exercises: Standing   Other Standing Lumbar Exercises Pallof Press BLUE x2ea side      Lumbar Exercises: Supine   Basic Lumbar Stabilization Limitations Pilates turn out leg press    Basic Lumbar Stabilization 10 reps    Other Supine Lumbar Exercises Dead bugs withOUT physioball 2x10    Other Supine Lumbar Exercises UE Bird Dog x10      Manual Therapy   Manual Therapy Soft tissue mobilization    Soft tissue mobilization cervical/thoracic/lumbar paraspinals, UT/levator                    PT Education - 10/02/20 1652     Education Details No new exercises, v cues for technique.    Methods Explanation;Demonstration;Verbal cues    Comprehension Verbalized understanding;Returned demonstration              PT Short Term Goals - 10/02/20 1627       PT SHORT TERM GOAL #2   Title Patient will report no more than 3/10 pain throughout the work day.  Baseline Max=7/10 at work.    Time 3    Period Weeks    Status On-going    Target Date 09/25/20               PT Long Term Goals - 09/04/20 2113       PT LONG TERM GOAL #1   Title Patient will be independent with final HEP and progression for continued improvement of mobility.    Baseline no exercise program    Time 6    Period Weeks    Status New    Target Date 10/16/20      PT LONG TERM GOAL #2   Title Patient will improve fwd flex to withing 5 cm of finger tips to floor with no more than 2/10 pain.    Baseline 14cm finger tips to floor.    Time 6    Period Weeks    Status New    Target Date 10/16/20      PT LONG TERM GOAL #3   Title Patient will be able to lift 8 pound object with proper body mechanics and pain no more than 2/10 pain to improve household chores.    Baseline Patient reports as much as 7/10 pain with household chores.    Time 6    Period Weeks    Status New    Target Date 10/16/20                   Plan - 10/02/20 1626     Clinical Impression  Statement Patient with no c/o neck or back discomfort after treament, fatiged but more flexible.  Patient making progress with decreased max pain at work. Patient will benefit from continued skilled PT to address deficits and maximize patient goal of reduced pain with daily tasks.    Comorbidities HTN    Examination-Activity Limitations Stand;Bend;Bed Mobility    Examination-Participation Restrictions Occupation    PT Treatment/Interventions ADLs/Self Care Home Management;Cryotherapy;Therapeutic activities;Therapeutic exercise;Neuromuscular re-education;Patient/family education;Manual techniques;Passive range of motion;Taping;Dry needling;Spinal Manipulations    PT Next Visit Plan Continue core, LE strengthening progression, manual therapy. Consider trialing spinal traction for pain reduction.    PT Home Exercise Plan [email or web address]    Consulted and Agree with Plan of Care Patient             Patient will benefit from skilled therapeutic intervention in order to improve the following deficits and impairments:  Decreased mobility, Decreased range of motion, Pain  Visit Diagnosis: Pain in thoracic spine  Cervicalgia  Acute bilateral low back pain without sciatica     Problem List Patient Active Problem List   Diagnosis Date Noted   Gonorrhea 08/13/2020   Back pain 09/06/2019   Abdominal cramping 08/09/2019   Gastroesophageal reflux disease 06/06/2019   Missed period 06/06/2019   Smoker 02/18/2019   Prediabetes 06/17/2018   Mumps without complication 05/18/2018   Essential hypertension 05/18/2018   Morbid obesity with body mass index (BMI) of 45.0 to 49.9 in adult (HCC) 11/30/2017   PCOS (polycystic ovarian syndrome) 11/30/2017   Low grade squamous intraepithelial lesion on cytologic smear of cervix (LGSIL) 05/21/2017   Breast lump on right side at 10 o'clock position 01/31/2013    Myrla Halsted, PT 10/02/2020, 5:00 PM  [facility] [address] [place], [place], [address] Phone: [phone]   Fax:  [phone]  Name: Julie Hendrix MRN: [id number] Date of Birth: 11/16/1985

## 2020-10-09 ENCOUNTER — Ambulatory Visit: Payer: Medicaid Other | Admitting: Physical Therapy

## 2020-10-09 ENCOUNTER — Encounter: Payer: Self-pay | Admitting: Physical Therapy

## 2020-10-09 ENCOUNTER — Other Ambulatory Visit: Payer: Self-pay

## 2020-10-09 DIAGNOSIS — M545 Low back pain, unspecified: Secondary | ICD-10-CM

## 2020-10-09 DIAGNOSIS — M546 Pain in thoracic spine: Secondary | ICD-10-CM | POA: Diagnosis not present

## 2020-10-09 DIAGNOSIS — M542 Cervicalgia: Secondary | ICD-10-CM | POA: Diagnosis not present

## 2020-10-09 NOTE — Therapy (Signed)
[facility] [address] [place], [place], [address] Phone: [phone]   Fax:  [phone]  Physical Therapy Treatment  Patient Details  Name: Julie Hendrix MRN: [id number] Date of Birth: 09-Sep-1985 Referring Provider (PT): McDiarmid, Tawanna Cooler   Encounter Date: 10/09/2020   PT End of Session - 10/09/20 1702     Visit Number 5    Number of Visits 6    Date for PT Re-Evaluation 10/16/20    Authorization Type [employer]    Progress Note Due on Visit 10    PT Start Time 1623    PT Stop Time 1702    PT Time Calculation (min) 39 min    Activity Tolerance Patient tolerated treatment well;No increased pain    Behavior During Therapy WFL for tasks assessed/performed             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   Subjective Assessment - 10/09/20 1625     Subjective Patient reports the HEP is going well. Patient state she feels she is getting a little more movement and feeling better.    Limitations Standing;Other (comment);House hold activities;Lifting    Currently in Pain? Yes    Pain Score 3     Pain Location Back    Pain Orientation Lower;Mid    Pain Descriptors / Indicators Aching    Pain Type Chronic pain    Pain Score 4    Pain Location Neck    Pain Orientation Mid                [facility] PT Assessment - 10/09/20 0001       Assessment   Medical Diagnosis LBP M54.50    Referring Provider (PT) McDiarmid, Todd      Precautions   Precautions None                           [facility] Adult PT Treatment/Exercise - 10/09/20 0001        Neck Exercises: Seated   Neck Retraction 10 reps    Neck Retraction Limitations with elevation      Lumbar Exercises: Stretches   Prone on Elbows Stretch 1 rep    Prone on Elbows Stretch Limitations 1 min    Press Ups 10 reps    Press Ups Limitations Cerv retract x10 in POE      Lumbar Exercises: Standing   Other Standing Lumbar Exercises Standing extension x15      Lumbar Exercises: Supine   Basic Lumbar Stabilization Limitations Pilates turn out leg press    Basic Lumbar Stabilization 10 reps      Lumbar Exercises: Sidelying   Other Sidelying Lumbar Exercises Open book R/L x10ea      Manual Therapy   Manual Therapy Soft tissue mobilization;Joint mobilization    Joint Mobilization Grade III PA glides thor and lumbar vert    Soft tissue mobilization cervical/thoracic/lumbar paraspinals, UT/levator      Neck Exercises: Stretches   Levator Stretch 20 seconds;3 reps  PT Education - 10/09/20 1708     Education Details Addition of prone and standing lumbar extension exercises.  Discussed possible d/c next visit as patient reports comfortable to progress and HEP tools.    Person(s) Educated Patient    Methods Explanation;Demonstration;Verbal cues    Comprehension Verbalized understanding;Returned demonstration              PT Short Term Goals - 10/02/20 1627       PT SHORT TERM GOAL #2   Title Patient will report no more than 3/10 pain throughout the work day.    Baseline Max=7/10 at work.    Time 3    Period Weeks    Status On-going    Target Date 09/25/20               PT Long Term Goals - 09/04/20 2113       PT LONG TERM GOAL #1   Title Patient will be independent with final HEP and progression for continued improvement of mobility.    Baseline no exercise program    Time 6    Period Weeks    Status New    Target Date 10/16/20      PT LONG TERM GOAL #2   Title Patient will improve fwd flex to withing 5 cm of  finger tips to floor with no more than 2/10 pain.    Baseline 14cm finger tips to floor.    Time 6    Period Weeks    Status New    Target Date 10/16/20      PT LONG TERM GOAL #3   Title Patient will be able to lift 8 pound object with proper body mechanics and pain no more than 2/10 pain to improve household chores.    Baseline Patient reports as much as 7/10 pain with household chores.    Time 6    Period Weeks    Status New    Target Date 10/16/20                   Plan - 10/09/20 1703     Clinical Impression Statement Patient performed treatment today without increased pain, one episode of tingling down leg, resoved with lumbar extension.  Patient given additional exension exercises for symptom management. Patient will benefit from continued skilled PT to finalize HEP and progression with likely discharge next visit.    Comorbidities HTN    Examination-Activity Limitations Stand;Bend;Bed Mobility    Examination-Participation Restrictions Occupation    PT Treatment/Interventions ADLs/Self Care Home Management;Cryotherapy;Therapeutic activities;Therapeutic exercise;Neuromuscular re-education;Patient/family education;Manual techniques;Passive range of motion;Taping;Dry needling;Spinal Manipulations    PT Next Visit Plan Continue core, LE strengthening progression, manual therapy. Consider trialing spinal traction for pain reduction. Extension biased. Discharge next visit?    PT Home Exercise Plan [email or web address]    Consulted and Agree with Plan of Care Patient             Patient will benefit from skilled therapeutic intervention in order to improve the following deficits and impairments:  Decreased mobility, Decreased range of motion, Pain  Visit Diagnosis: Pain in thoracic spine  Cervicalgia  Acute bilateral low back pain without sciatica     Problem List Patient Active Problem List   Diagnosis Date Noted   Gonorrhea 08/13/2020   Back pain 09/06/2019    Abdominal cramping 08/09/2019   Gastroesophageal reflux disease 06/06/2019   Missed period 06/06/2019   Smoker 02/18/2019   Prediabetes 06/17/2018   Mumps without  complication 05/18/2018   Essential hypertension 05/18/2018   Morbid obesity with body mass index (BMI) of 45.0 to 49.9 in adult 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

## 2020-10-09 NOTE — Patient Instructions (Signed)
Access Code: [email or web address] URL: [email or web address] Date: 10/09/2020 Prepared by: Myrla Halsted  NEW Exercises  Cervical Retraction Prone on Elbows - 1 x daily - 7 x weekly - 1 sets - 10 reps Prone on Elbows Stretch - 1 x daily - 7 x weekly - 1 sets - hold Quadruped Rock Back into [employer] Up - 1 x daily - 7 x weekly - 1 sets - 10 reps Standing Lumbar Extension - 1 x daily - 7 x weekly - 1 sets - 10 reps

## 2020-10-16 ENCOUNTER — Encounter: Payer: Self-pay | Admitting: Physical Therapy

## 2020-10-16 ENCOUNTER — Ambulatory Visit: Payer: Medicaid Other | Admitting: Physical Therapy

## 2020-10-16 ENCOUNTER — Other Ambulatory Visit: Payer: Self-pay

## 2020-10-16 DIAGNOSIS — M545 Low back pain, unspecified: Secondary | ICD-10-CM

## 2020-10-16 DIAGNOSIS — M542 Cervicalgia: Secondary | ICD-10-CM | POA: Diagnosis not present

## 2020-10-16 DIAGNOSIS — M546 Pain in thoracic spine: Secondary | ICD-10-CM

## 2020-10-16 NOTE — Therapy (Addendum)
[facility] [place], [place], [address] Phone: [phone]   Fax:  [phone]  Physical Therapy Treatment / Discharge  Patient Details  Name: Julie Hendrix MRN: [id number] Date of Birth: 05-13-1985 Referring Provider (PT): McDiarmid, Sherren Mocha   Encounter Date: 10/16/2020   PT End of Session - 10/16/20 1619     Visit Number 6    Number of Visits 6    Date for PT Re-Evaluation 10/16/20    Authorization Type [employer]    PT Start Time 1620    PT Stop Time 1655    PT Time Calculation (min) 35 min    Activity Tolerance Patient tolerated treatment well;No increased pain    Behavior During Therapy WFL for tasks assessed/performed             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   Subjective Assessment - 10/16/20 1622     Subjective Patient reports feeling good, doing more exercises at the gym.    Limitations Standing;Other (comment);House hold activities;Lifting    Currently in Pain? Yes    Pain Score 0-No pain   Max since last visit = 3/10   Pain Location Back                [facility] PT Assessment - 10/16/20 0001       Assessment   Medical Diagnosis LBP M54.50    Referring Provider (PT) McDiarmid, Todd      Precautions   Precautions None      AROM   Cervical Flexion 35    Cervical Extension 45    Cervical - Right Side Bend 38    Cervical - Left Side Bend 38    Cervical - Right Rotation 85    Cervical - Left Rotation 78    Lumbar Flexion 3cm    Lumbar - Right Side Bend 31    Lumbar - Left Side Bend 33    Lumbar - Right  Rotation 100%    Lumbar - Left Rotation 100%                           [facility] Adult PT Treatment/Exercise - 10/16/20 0001       Neck Exercises: Seated   Neck Retraction 10 reps    Neck Retraction Limitations with elevation      Lumbar Exercises: Stretches   Other Lumbar Stretch Exercise seated Ball roll out      Lumbar Exercises: Standing   Other Standing Lumbar Exercises Pallof Press BLUE x2ea side      Lumbar Exercises: Supine   Other Supine Lumbar Exercises Dead bugs withOUT physioball 2x10      Lumbar Exercises: Sidelying   Other Sidelying Lumbar Exercises Open book R/L x10ea      Manual Therapy   Manual Therapy Soft tissue mobilization;Joint mobilization    Joint Mobilization Grade III PA glides thor and lumbar vert    Soft tissue mobilization cervical/thoracic/lumbar paraspinals, UT/levator      Neck Exercises: Stretches  Levator Stretch 20 seconds;3 reps                    PT Education - 10/16/20 1649     Education Details Reviewed entire HEP, recommendations for which ones to continue regularly and which ones to keep for future needs if symptoms return.    Person(s) Educated Patient    Methods Explanation;Demonstration    Comprehension Verbalized understanding;Returned demonstration              PT Short Term Goals - 10/16/20 1624       PT SHORT TERM GOAL #1   Title Patient will be independent with initial HEP to improve trunk mobility and reduce pain.    Period Weeks    Status Achieved      PT SHORT TERM GOAL #2   Title Patient will report no more than 3/10 pain throughout the work day.    Time 3    Period Weeks    Status Achieved               PT Long Term Goals - 10/16/20 1625       PT LONG TERM GOAL #1   Title Patient will be independent with final HEP and progression for continued improvement of mobility.    Baseline no exercise program    Time 6    Period Weeks    Status Achieved      PT LONG TERM  GOAL #2   Title Patient will improve fwd flex to withing 5 cm of finger tips to floor with no more than 2/10 pain.   3cm at discharge   Baseline 14cm finger tips to floor.    Time 6    Period Weeks    Status Achieved      PT LONG TERM GOAL #3   Title Patient will be able to lift 8 pound object with proper body mechanics and pain no more than 2/10 pain to improve household chores.   8lb no pain at discharge   Baseline Patient reports as much as 7/10 pain with household chores.    Time 6    Period Weeks    Status Achieved                   Plan - 10/16/20 1653     Clinical Impression Statement Patient has met all therapy goals including ability to pick up 8 pound object from floor with no pain.  Patient is continuing independent HEP and has started going to gym. Patient is satisfied with progress and agreeable to discharge today.    Comorbidities HTN    Examination-Activity Limitations Stand;Bend;Bed Mobility    Examination-Participation Restrictions Occupation    PT Treatment/Interventions ADLs/Self Care Home Management;Cryotherapy;Therapeutic activities;Therapeutic exercise;Neuromuscular re-education;Patient/family education;Manual techniques;Passive range of motion;Taping;Dry needling;Spinal Manipulations    PT Next Visit Plan Discharged today.    PT Home Exercise Plan [email or web address]    Consulted and Agree with Plan of Care Patient             Patient will benefit from skilled therapeutic intervention in order to improve the following deficits and impairments:  Decreased mobility, Decreased range of motion, Pain  Visit Diagnosis: Pain in thoracic spine  Cervicalgia  Acute bilateral low back pain without sciatica     Problem List Patient Active Problem List   Diagnosis Date Noted   Gonorrhea 08/13/2020   Back pain 09/06/2019   Abdominal cramping 08/09/2019   Gastroesophageal reflux disease 06/06/2019  Missed period 06/06/2019   Smoker 02/18/2019   Prediabetes  06/17/2018   Mumps without complication 88/82/8003   Essential hypertension 05/18/2018   Morbid obesity with body mass index (BMI) of 45.0 to 49.9 in adult [facility]) 11/30/2017   PCOS (polycystic ovarian syndrome) 11/30/2017   Low grade squamous intraepithelial lesion on cytologic smear of cervix (LGSIL) 05/21/2017   Breast lump on right side at 10 o'clock position 01/31/2013    Pollyann Samples, PT 10/16/2020, 4:56 PM  [facility] [address] [place], [place], [address] Phone: [phone]   Fax:  [phone]  Name: Julie Hendrix MRN: [id number] Date of Birth: 06-24-1985  PHYSICAL THERAPY DISCHARGE SUMMARY  Visits from Start of Care: 6  Current functional level related to goals / functional outcomes: Patient now performing daily tasks with typically no pain, occasional 3/10 max.    Remaining deficits: Mild R side cervical pain, patient able to self manage.   Education / Equipment: HEP Review   Patient agrees to discharge. Patient goals were met. Patient is being discharged due to being pleased with the current functional level.

## 2020-10-29 ENCOUNTER — Ambulatory Visit (INDEPENDENT_AMBULATORY_CARE_PROVIDER_SITE_OTHER): Payer: Medicaid Other

## 2020-10-29 ENCOUNTER — Other Ambulatory Visit (HOSPITAL_COMMUNITY)
Admission: RE | Admit: 2020-10-29 | Discharge: 2020-10-29 | Disposition: A | Discharge status: Home / Self Care | Payer: Medicaid Other | Source: Ambulatory Visit | Attending: Family Medicine | Admitting: Family Medicine

## 2020-10-29 ENCOUNTER — Telehealth: Payer: Self-pay

## 2020-10-29 ENCOUNTER — Other Ambulatory Visit: Payer: Self-pay

## 2020-10-29 VITALS — BP 135/85 | HR 75 | Ht 63.0 in | Wt 276.8 lb

## 2020-10-29 DIAGNOSIS — N760 Acute vaginitis: Secondary | ICD-10-CM

## 2020-10-29 DIAGNOSIS — N898 Other specified noninflammatory disorders of vagina: Secondary | ICD-10-CM | POA: Diagnosis not present

## 2020-10-29 DIAGNOSIS — Z5941 Food insecurity: Secondary | ICD-10-CM

## 2020-10-29 DIAGNOSIS — B9689 Other specified bacterial agents as the cause of diseases classified elsewhere: Secondary | ICD-10-CM

## 2020-10-29 NOTE — Telephone Encounter (Signed)
Patient was transferred to nurse line by FO. Patient reports she was seen at her OBGYN today and had an elevated BP reading of 135/85. Patient reports she has been compliant with taking amlodipine, last took at ~8am today. Patient does report she has been eating "terribly" a lot of salty foods in the last several days. Patient does report a "slight" headache, however no chest pain, vision changes, or SOB. Patient has already been scheduled by FO for tomorrow. Patient advised to limit salt intake and drink plenty of water. ED precautions given.

## 2020-10-29 NOTE — Progress Notes (Signed)
Pt here today for vaginal discharge and odor x 2 weeks. Pt states got Rx Flagyl from [facility], but has not taken it. Was prescribed by our providers on 09/10/20 with refills due to pt have hx of BV.  Pt denies any abd cramps or pain, or any new exposure to STDs.   Self swab collected today. Pt advised results will take 24-48 hours and will see results in mychart and will be notified if needs further treatment. Pt verbalized understanding and agreeable to plan of care.   Judeth Cornfield, RN

## 2020-10-29 NOTE — Progress Notes (Signed)
Patient seen and assessed by nursing staff.  Agree with documentation and plan.  

## 2020-10-30 ENCOUNTER — Ambulatory Visit (HOSPITAL_COMMUNITY)
Admission: RE | Admit: 2020-10-30 | Discharge: 2020-10-30 | Disposition: A | Discharge status: Home / Self Care | Payer: Medicaid Other | Source: Ambulatory Visit | Attending: Family Medicine | Admitting: Family Medicine

## 2020-10-30 ENCOUNTER — Encounter: Payer: Self-pay | Admitting: Family Medicine

## 2020-10-30 ENCOUNTER — Ambulatory Visit: Payer: Medicaid Other | Admitting: Family Medicine

## 2020-10-30 ENCOUNTER — Other Ambulatory Visit: Payer: Self-pay

## 2020-10-30 VITALS — BP 139/86 | HR 93 | Wt 274.0 lb

## 2020-10-30 DIAGNOSIS — R0789 Other chest pain: Secondary | ICD-10-CM | POA: Diagnosis not present

## 2020-10-30 DIAGNOSIS — I10 Essential (primary) hypertension: Secondary | ICD-10-CM | POA: Diagnosis not present

## 2020-10-30 DIAGNOSIS — R079 Chest pain, unspecified: Secondary | ICD-10-CM | POA: Insufficient documentation

## 2020-10-30 LAB — CERVICOVAGINAL ANCILLARY ONLY
Bacterial Vaginitis (gardnerella): POSITIVE — AB
Candida Glabrata: NEGATIVE
Candida Vaginitis: POSITIVE — AB
Chlamydia: NEGATIVE
Comment: NEGATIVE
Comment: NEGATIVE
Comment: NEGATIVE
Comment: NEGATIVE
Comment: NEGATIVE
Comment: NORMAL
Neisseria Gonorrhea: NEGATIVE
Trichomonas: NEGATIVE

## 2020-10-30 MED ORDER — FLUCONAZOLE 150 MG PO TABS: 150.0000 mg | ORAL_TABLET | Freq: Every day | ORAL | 2 refills | Status: DC

## 2020-10-30 MED ORDER — METRONIDAZOLE 500 MG PO TABS: 500.0000 mg | ORAL_TABLET | Freq: Two times a day (BID) | ORAL | 0 refills | Status: AC

## 2020-10-30 MED ORDER — HYDROCHLOROTHIAZIDE 12.5 MG PO TABS: 12.5000 mg | ORAL_TABLET | Freq: Every day | ORAL | 3 refills | Status: DC

## 2020-10-30 NOTE — Patient Instructions (Signed)
It was a pleasure seeing you today.  I am sorry you are having these issues with your blood pressure.  We have started you on a medication called hydrochlorothiazide but I want you to continue taking your Norvasc 10 mg daily.  I would like for you to follow-up in our clinic in 1 week for a repeat blood pressure check as well as some lab work to check your kidney function.  I have placed a referral for cardiology to call you regarding your recurrent chest pain and swelling in your lower extremities.  If you have any questions or concerns please feel free to call the clinic.  If your symptoms worsen your chest pain worsens, you have shortness of breath please seek medical attention immediately at the emergency department.  I hope you have a wonderful afternoon!

## 2020-10-30 NOTE — Addendum Note (Signed)
Addended by: Reva Bores on: 10/30/2020 04:03 PM   Modules accepted: Orders

## 2020-10-30 NOTE — Progress Notes (Signed)
    SUBJECTIVE:   CHIEF COMPLAINT / HPI:   Blood pressure concerns Patient reports that she was seen at her obstetrician/gynecologist and was told that her blood pressure was elevated in the 130s/80s.  She has been having intermittent headaches and figured it may be because of her blood pressure being out of control.  She wanted to be further evaluated for this.  Denies any shortness of breath.  Acknowledges intermittent chest pain.  Chest pain Patient reports problems with intermittent chest pain for "a long time".  She reports that it does not occur with exertion and occurs irregularly.  Radiates down her left arm.  She has been evaluated for this in the past but has never seen cardiology per her report.  Denies any shortness of breath.  Acknowledges that she has swelling in her lower extremities bilaterally and that is gotten worse over the last month or 2.  Denies any history of heart issues.  OBJECTIVE:   BP 139/86   Pulse 93   Wt 274 lb (124.3 kg)   LMP 10/17/2020 (Exact Date)   SpO2 100%   BMI 48.54 kg/m   General: Anxious appearing 35 year old female in no acute distress Cardiac: Regular rate and rhythm, no murmurs appreciated Respiratory: Normal work of breathing, lungs clear to auscultation bilaterally Abdomen: Soft, nontender, positive bowel sounds MSK: Trace lower extremity edema bilaterally  ASSESSMENT/PLAN:   Essential hypertension Blood pressure today mildly elevated at 139/86.  She reports good compliance with her amlodipine 10 mg daily.  Discussed addition of second medication and patient would like this if possible.  Started patient on hydrochlorothiazide 12.5 mg daily.  We will follow-up for blood pressure check in 1-2 weeks.  Discussed low sodium diet as well as exercise and discontinuing smoking.  Chest discomfort Patient complaining recurrent episodes of chest pain with no obvious cause.  Pain radiates down her left arm.  EKG showing normal sinus rhythm today.   We discussed options of further evaluation in the emergency department versus referral to cardiology and she is elected for referral to cardiology.  Referral placed.  Strict ED precautions given.     Derrel Nip, MD [facility]

## 2020-10-31 DIAGNOSIS — R0789 Other chest pain: Secondary | ICD-10-CM | POA: Insufficient documentation

## 2020-10-31 NOTE — Assessment & Plan Note (Addendum)
Patient complaining recurrent episodes of chest pain with no obvious cause.  Pain radiates down her left arm.  EKG showing normal sinus rhythm today.  We discussed options of further evaluation in the emergency department versus referral to cardiology and she is elected for referral to cardiology.  Referral placed.  Strict ED precautions given.

## 2020-10-31 NOTE — Assessment & Plan Note (Signed)
Blood pressure today mildly elevated at 139/86.  She reports good compliance with her amlodipine 10 mg daily.  Discussed addition of second medication and patient would like this if possible.  Started patient on hydrochlorothiazide 12.5 mg daily.  We will follow-up for blood pressure check in 1-2 weeks.  Discussed low sodium diet as well as exercise and discontinuing smoking.

## 2020-11-06 NOTE — Progress Notes (Signed)
    SUBJECTIVE:   CHIEF COMPLAINT / HPI:   Essential HTN: Seen by Dr. Nobie Putnam two weeks ago and started on HCTZ 12.5mg  daily in addition to Amlodipine 10mg  daily that she was already taking.  She takes these as prescribed and has no issues affording them at this time.  She does not regularly check her blood pressure at home but when she does check it she says that she is usually in the 120s systolic. BP in office today 133/78.  She has not had a BMP since 2015. She has been making many changes in her life to improve her health including regularly attending the gym, getting a personal trainer, eating healthier.  She also quit smoking 5 to 6 months ago.  Contraception She is sexually active and uses condoms but not regularly. Follows at 2016 for [employer] for most OB/Gyn concerns. She is open to pregnancy at this time but not actively pursuing it.  She is open to initiating a prenatal vitamin to support fetal neurodevelopment.   OBJECTIVE:   BP 133/78   Pulse 93   Wt 275 lb 6.4 oz (124.9 kg)   LMP 10/17/2020 (Exact Date)   SpO2 100%   BMI 48.78 kg/m   Physical Exam Constitutional:      General: She is not in acute distress. Cardiovascular:     Rate and Rhythm: Normal rate and regular rhythm.     Heart sounds: Normal heart sounds. No murmur heard. Pulmonary:     Effort: Pulmonary effort is normal.     Breath sounds: Normal breath sounds.     ASSESSMENT/PLAN:   Morbid obesity with body mass index (BMI) of 45.0 to 49.9 in adult [facility]) She is making many changes to her lifestyle that should result in substantial weight loss.  Congratulated her on the success and encouraged further efforts. -We will check lipid panel and TSH today  Essential hypertension Blood pressure well controlled on current regimen of amlodipine 10 mg daily and HCTZ 12.5 mg daily. -Continue amlodipine and HCTZ -Congratulated on success with exercise regimen -Continue diet and  exercise  Contraception management Patient using condoms irregularly.  Open to pregnancy at this time but not actively pursuing. -We will start prenatal vitamin -Return visit for preconception counseling if wanting to actively pursue pregnancy     [facility], MD [facility]

## 2020-11-07 ENCOUNTER — Other Ambulatory Visit: Payer: Self-pay

## 2020-11-07 ENCOUNTER — Encounter: Payer: Self-pay | Admitting: Student

## 2020-11-07 ENCOUNTER — Ambulatory Visit (INDEPENDENT_AMBULATORY_CARE_PROVIDER_SITE_OTHER): Payer: Medicaid Other | Admitting: Student

## 2020-11-07 VITALS — BP 133/78 | HR 93 | Wt 275.4 lb

## 2020-11-07 DIAGNOSIS — I10 Essential (primary) hypertension: Secondary | ICD-10-CM

## 2020-11-07 DIAGNOSIS — Z6841 Body Mass Index (BMI) 40.0 and over, adult: Secondary | ICD-10-CM | POA: Diagnosis not present

## 2020-11-07 DIAGNOSIS — Z3009 Encounter for other general counseling and advice on contraception: Secondary | ICD-10-CM | POA: Diagnosis not present

## 2020-11-07 DIAGNOSIS — Z309 Encounter for contraceptive management, unspecified: Secondary | ICD-10-CM | POA: Insufficient documentation

## 2020-11-07 NOTE — Assessment & Plan Note (Addendum)
She is making many changes to her lifestyle that should result in weight loss.  Congratulated her on the success and encouraged further efforts. -We will check lipid panel and TSH today

## 2020-11-07 NOTE — Assessment & Plan Note (Signed)
Patient using condoms irregularly.  Open to pregnancy at this time but not actively pursuing. -We will start prenatal vitamin -Return visit for preconception counseling if wanting to actively pursue pregnancy

## 2020-11-07 NOTE — Patient Instructions (Addendum)
Julie Hendrix,   It is such a joy to take care you! Thank you for coming in today.   As a reminder, here is a recap of what we talked about today:  - Your blood pressure is much better on your new medications. Continue taking these as they seem to be working well. - Keep up the excellent work with your diet and exercise! This is absolutely the best thing you can be doing for your health. - Because you are open to pregnancy at this time, I would suggest that you start taking a daily multivitamin  - The cardiologist's office should be reaching out to you soon to schedule an appointment, if you haven't heard from them in two weeks, please give our office a call.   We are checking some labs today, I will call you if they are abnormal. I will send you a MyChart message or a letter if they are normal.  If you do not hear about your labs in the next 2 weeks please let us know.  I recommend that you always bring your medications to each appointment as this makes it easy to ensure we are on the correct medications and helps [place] not miss when refills are needed.  Take care and seek immediate care sooner if you develop any concerns.   Eliezer Mccoy, MD [facility]

## 2020-11-07 NOTE — Assessment & Plan Note (Addendum)
Blood pressure well controlled on current regimen of amlodipine 10 mg daily and HCTZ 12.5 mg daily. -Continue amlodipine and HCTZ -Checking a BMP today -Continue diet and exercise

## 2020-11-08 LAB — BASIC METABOLIC PANEL
BUN/Creatinine Ratio: 13 (ref 9–23)
BUN: 7 mg/dL (ref 6–20)
CO2: 24 mmol/L (ref 20–29)
Calcium: 9.9 mg/dL (ref 8.7–10.2)
Chloride: 100 mmol/L (ref 96–106)
Creatinine, Ser: 0.56 mg/dL — ABNORMAL LOW (ref 0.57–1.00)
Glucose: 80 mg/dL (ref 65–99)
Potassium: 4.3 mmol/L (ref 3.5–5.2)
Sodium: 139 mmol/L (ref 134–144)
eGFR: 121 mL/min/{1.73_m2} (ref >59)

## 2020-11-08 LAB — TSH: TSH: 1.34 u[IU]/mL (ref 0.450–4.500)

## 2020-11-08 LAB — LIPID PANEL
Chol/HDL Ratio: 3.9 ratio (ref 0.0–4.4)
Cholesterol, Total: 146 mg/dL (ref 100–199)
HDL: 37 mg/dL — ABNORMAL LOW (ref >39)
LDL Chol Calc (NIH): 80 mg/dL (ref 0–99)
Triglycerides: 170 mg/dL — ABNORMAL HIGH (ref 0–149)
VLDL Cholesterol Cal: 29 mg/dL (ref 5–40)

## 2020-12-26 ENCOUNTER — Other Ambulatory Visit: Payer: Self-pay

## 2020-12-26 ENCOUNTER — Other Ambulatory Visit (HOSPITAL_COMMUNITY)
Admission: RE | Admit: 2020-12-26 | Discharge: 2020-12-26 | Disposition: A | Discharge status: Home / Self Care | Payer: Medicaid Other | Source: Ambulatory Visit | Attending: Family Medicine | Admitting: Family Medicine

## 2020-12-26 ENCOUNTER — Ambulatory Visit (INDEPENDENT_AMBULATORY_CARE_PROVIDER_SITE_OTHER): Payer: Medicaid Other

## 2020-12-26 VITALS — BP 143/83 | HR 83 | Wt 278.0 lb

## 2020-12-26 DIAGNOSIS — N898 Other specified noninflammatory disorders of vagina: Secondary | ICD-10-CM | POA: Diagnosis not present

## 2020-12-26 NOTE — Patient Instructions (Addendum)
Please purchase over the counter probiotic with: LACTOBACILLUS or ACIDOPHILUS. Store brand is okay.   [facility]:     Natural Remedies for Bacterial Vaginosis   Option #1  1 Tbsp Fractitionated Coconut Oil  10 drops of Melaleuca (Tea Tree) Oil   Mix ingredients together well.  Soak 3-4 tampons (in applicators) in that mixture until all or mostly all mixture is soaked up into the tampons.  Insert 1 saturated tampon vaginally and wear overnight for 3-4 nights.     Option #2 (sometimes to be used in conjunction with option #1)  Fill tub with enough to cover lap/lower abdomen warm water.  Mix 1/2 cup of baking soda in water.  Soak in water/baking soda mixture for at least 20 minutes.  Be sure to swish water in between legs to get as much in vagina as possible.  This soak should be done after sexual intercourse and menstrual cycles.     You can purchase Tea Tree Oil locally at:   [employer]  [address]  [place] [address]   [facility]  [address] [place] [address]

## 2020-12-26 NOTE — Progress Notes (Signed)
Attestation of Attending Supervision of clinical support staff: I agree with the care provided to this patient and was available for any consultation.  I have reviewed the RN's note and chart. I was available for consult and to see the patient if needed.   Khanh Tanori MD MPH Attending Physician Faculty Practice- [facility]  

## 2020-12-26 NOTE — Progress Notes (Signed)
Here today for vaginal odor following unprotected intercourse 1 week prior. Pt reports history of recurrent BV. Self swab instructions given and specimen obtained. Explained we will follow up with any abnormal results. Pt asks about name of probiotic that was recommended by Sam, CNM. Per chart review lactobacillus or acidophilus recommended, included in patient's wrap up information so she may purchase otc. Also discussed good vaginal hygiene and non medication preventative options. Pt reports she has recently stopped twice weekly Metrogel but plans to restart. States this was recommended by Camelia Eng, NP at prior visit. Recommended that patient not use Metrogel until results are back, explained this will not treat all infections Encouraged pt to schedule appt to see provider if she would like to continue the discussion about recurrent BV.   Fleet Contras RN 12/26/20

## 2020-12-27 ENCOUNTER — Other Ambulatory Visit: Payer: Self-pay | Admitting: Family Medicine

## 2020-12-27 DIAGNOSIS — B9689 Other specified bacterial agents as the cause of diseases classified elsewhere: Secondary | ICD-10-CM

## 2020-12-27 LAB — CERVICOVAGINAL ANCILLARY ONLY
Bacterial Vaginitis (gardnerella): POSITIVE — AB
Candida Glabrata: NEGATIVE
Candida Vaginitis: NEGATIVE
Chlamydia: NEGATIVE
Comment: NEGATIVE
Comment: NEGATIVE
Comment: NEGATIVE
Comment: NEGATIVE
Comment: NEGATIVE
Comment: NORMAL
Neisseria Gonorrhea: NEGATIVE
Trichomonas: NEGATIVE

## 2020-12-27 MED ORDER — METRONIDAZOLE 500 MG PO TABS: 500.0000 mg | ORAL_TABLET | Freq: Two times a day (BID) | ORAL | 0 refills | Status: DC

## 2020-12-30 ENCOUNTER — Telehealth: Payer: Self-pay

## 2020-12-30 NOTE — Telephone Encounter (Signed)
-----   Message from Federico Flake, MD sent at 12/27/2020  4:01 PM EDT ----- Sent in metronidazole 9/9. Also pt has metrogel she can use

## 2020-12-30 NOTE — Telephone Encounter (Signed)
Spoke to patient regarding recent lab results. Patient confirmed she saw the MyChart messages and picked up the medication.  Patient verified DOB. No additional questions/concerns.  Felecia Shelling, [place] 12/30/2020

## 2021-01-15 ENCOUNTER — Other Ambulatory Visit: Payer: Self-pay

## 2021-01-15 ENCOUNTER — Ambulatory Visit: Payer: Medicaid Other | Admitting: Internal Medicine

## 2021-01-15 ENCOUNTER — Encounter: Payer: Self-pay | Admitting: Internal Medicine

## 2021-01-15 VITALS — BP 134/78 | HR 82 | Ht 63.0 in | Wt 285.0 lb

## 2021-01-15 DIAGNOSIS — Z6841 Body Mass Index (BMI) 40.0 and over, adult: Secondary | ICD-10-CM

## 2021-01-15 DIAGNOSIS — R079 Chest pain, unspecified: Secondary | ICD-10-CM | POA: Diagnosis not present

## 2021-01-15 NOTE — Patient Instructions (Signed)
Medication Instructions:  Your physician recommends that you continue on your current medications as directed. Please refer to the Current Medication list given to you today.  *If you need a refill on your cardiac medications before your next appointment, please call your pharmacy*   Lab Work: NONE If you have labs (blood work) drawn today and your tests are completely normal, you will receive your results only by: MyChart Message (if you have MyChart) OR A paper copy in the mail If you have any lab test that is abnormal or we need to change your treatment, we will call you to review the results.   Testing/Procedures: Your physician has requested that you have an exercise tolerance test. For further information please visit [email or web address] Please also follow instruction sheet, as given.    Follow-Up:  As Needed  At [facility], you and your health needs are our priority.  As part of our continuing mission to provide you with exceptional heart care, we have created designated Provider Care Teams.  These Care Teams include your primary Cardiologist (physician) and Advanced Practice Providers (APPs -  Physician Assistants and Nurse Practitioners) who all work together to provide you with the care you need, when you need it.

## 2021-01-15 NOTE — Progress Notes (Signed)
Cardiology Office Note:    Date:  01/15/2021   ID:  Julie Hendrix, DOB 10/26/1985, MRN [id number]  PCP:  Alicia Amel, MD   [facility] Providers Cardiologist:  Christell Constant, MD     Referring MD: Carney Living, *   CC: Chest pain Consulted for the evaluation of chest pain at the behest of Alicia Amel, MD  History of Present Illness:    Julie Hendrix is a 35 y.o. female with a hx of HTN, Former Tobacco Use, PCOS, Morbid Obesity who presents for evaluation 01/15/21.  Patient notes that she is feeling new chest pain. Chest pain started to occur for one year.   Sternal chest pain and side pain.  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  Also wakes her up from sleep (sharp pain). Discomfort improves with deep breaths and massaging it.  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  Feels nervous with chest pain.Has back pain that is associated with all of them.  No shortness of breath, DOE.  No PND or orthopnea.  Notes weight gain, with a little leg swelling , and significant abdominal swelling.  No syncope or near syncope. Notes  no palpitations or funny heart beats.   No leg claudication.Notes that she has been binge eating more that normal and attributes this to her weight gain.  No history of pre-eclampsia, gestation HTN or gestational DM.   Ambulatory BP not done.   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 Started 12/2018, trying to quit.     Past Surgical History:  Procedure Laterality Date   COLPOSCOPY     CRYOTHERAPY  06/08/2017   cervix    Current Medications: Current Meds  Medication Sig    amLODipine (NORVASC) 10 MG tablet Take 1 tablet (10 mg total) by mouth daily.   aspirin EC 81 MG tablet Take 81 mg by mouth daily.   hydrochlorothiazide (HYDRODIURIL) 12.5 MG tablet Take 1 tablet (12.5 mg total) by mouth daily.   metroNIDAZOLE (METROGEL) 0.75 % vaginal gel Place 1 Applicatorful vaginally 2 (two) times a week.     Allergies:   Patient has no known allergies.   Social History   Socioeconomic History   Marital status: Single    Spouse name: Not on file   Number of children: Not on file   Years of education: Not on file   Highest education level: Not on file  Occupational History   Not on file  Tobacco Use   Smoking status: Former    Packs/day: 0.25    Types: Cigarettes, Cigars    Quit date: 05/21/2020    Years since quitting: 0.6   Smokeless tobacco: Never   Tobacco comments:    black and mild  Vaping Use   Vaping Use: Never used  Substance and Sexual Activity   Alcohol use: Yes    Alcohol/week: 9.0 standard drinks    Types: 3 Glasses of wine, 3 Cans of beer, 3 Shots of liquor per week   Drug use: No   Sexual activity: Yes    Birth control/protection: None  Other Topics Concern  Not on file  Social History Narrative   Not on file   Social Determinants of Health   Financial Resource Strain: Not on file  Food Insecurity: Food Insecurity Present   Worried About Running Out of Food in the Last Year: Sometimes true   Ran Out of Food in the Last Year: Never true  Transportation Needs: No Transportation Needs   Lack of Transportation (Medical): No   Lack of Transportation (Non-Medical): No  Physical Activity: Not on file  Stress: Not on file  Social Connections: Not on file    Social: has a boy and a girl  Family History: The patient's family history includes Diabetes in her father; Heart attack in her father; Hypertension in her father and mother.  ROS:   Please see the history of present illness.   Back Pain with all of her chest pains.  All other  systems reviewed and are negative.  EKGs/Labs/Other Studies Reviewed:    The following studies were reviewed today:  EKG:  EKG is  ordered today.  The ekg ordered today demonstrates  01/15/21: SR rate 82 WNL  Recent Labs: 11/07/2020: BUN 7; Creatinine, Ser 0.56; Potassium 4.3; Sodium 139; TSH 1.340  Recent Lipid Panel    Component Value Date/Time   CHOL 146 11/07/2020 1444   TRIG 170 (H) 11/07/2020 1444   HDL 37 (L) 11/07/2020 1444   CHOLHDL 3.9 11/07/2020 1444   LDLCALC 80 11/07/2020 1444    Physical Exam:    VS:  BP 134/78   Pulse 82   Ht 5\' 3"  (1.6 m)   Wt 285 lb (129.3 kg)   SpO2 97%   BMI 50.49 kg/m     Left arm large cuff manual: 136/80  Wt Readings from Last 3 Encounters:  01/15/21 285 lb (129.3 kg)  12/26/20 278 lb (126.1 kg)  11/07/20 275 lb 6.4 oz (124.9 kg)    GEN: Obese well developed in no acute distress HEENT: Normal NECK: No JVD LYMPHATICS: No lymphadenopathy CARDIAC: RRR, no murmurs, rubs, gallops RESPIRATORY:  Clear to auscultation without rales, wheezing or rhonchi  ABDOMEN: Soft, non-tender, non-distended MUSCULOSKELETAL:  No edema; No deformity  SKIN: Warm and dry NEUROLOGIC:  Alert and oriented x 3 PSYCHIATRIC:  Normal affect   ASSESSMENT:    1. Chest pain of uncertain etiology   2. Morbid obesity with body mass index (BMI) of 45.0 to 49.9 in adult [facility])    PLAN:    Chest Pain Morbid Obesity Question of PCOS HTN - atypical chest pain - will get POET  Former Tobacco Abuse - continue cessation  PRN follow up unless stress test is positive   Shared Decision Making/Informed Consent The risks [chest pain, shortness of breath, cardiac arrhythmias, dizziness, blood pressure fluctuations, myocardial infarction, stroke/transient ischemic attack, and life-threatening complications (estimated to be 1 in 10,000)], benefits (risk stratification, diagnosing coronary artery disease, treatment guidance) and alternatives of an exercise  tolerance test were discussed in detail with Julie Hendrix and she agrees to proceed.    Medication Adjustments/Labs and Tests Ordered: Current medicines are reviewed at length with the patient today.  Concerns regarding medicines are outlined above.  Orders Placed This Encounter  Procedures   Cardiac Stress Test: Informed Consent Details: Physician/Practitioner Attestation; Transcribe to consent form and obtain patient signature   EKG 12-Lead    No orders of the defined types were placed in this encounter.   There are no Patient Instructions on file for this visit.   Signed, Bao Coreas  Richardean Chimera, MD  01/15/2021 3:50 PM     [facility]

## 2021-01-30 ENCOUNTER — Ambulatory Visit (INDEPENDENT_AMBULATORY_CARE_PROVIDER_SITE_OTHER): Payer: Medicaid Other

## 2021-01-30 ENCOUNTER — Other Ambulatory Visit: Payer: Self-pay

## 2021-01-30 DIAGNOSIS — R079 Chest pain, unspecified: Secondary | ICD-10-CM

## 2021-01-30 DIAGNOSIS — Z6841 Body Mass Index (BMI) 40.0 and over, adult: Secondary | ICD-10-CM

## 2021-01-30 LAB — EXERCISE TOLERANCE TEST
Angina Index: 0
Base ST Depression (mm): 0 mm
Duke Treadmill Score: 6
Estimated workload: 7
Exercise duration (min): 6 min
Exercise duration (sec): 0 s
MPHR: 184 {beats}/min
Peak HR: 164 {beats}/min
Percent HR: 89 %
RPE: 17
Rest HR: 84 {beats}/min
ST Depression (mm): 0 mm

## 2021-02-18 ENCOUNTER — Ambulatory Visit: Payer: Medicaid Other

## 2021-02-19 ENCOUNTER — Ambulatory Visit (INDEPENDENT_AMBULATORY_CARE_PROVIDER_SITE_OTHER): Payer: Medicaid Other

## 2021-02-19 ENCOUNTER — Other Ambulatory Visit (HOSPITAL_COMMUNITY)
Admission: RE | Admit: 2021-02-19 | Discharge: 2021-02-19 | Disposition: A | Discharge status: Home / Self Care | Payer: Medicaid Other | Source: Ambulatory Visit | Attending: Family Medicine | Admitting: Family Medicine

## 2021-02-19 ENCOUNTER — Other Ambulatory Visit: Payer: Self-pay

## 2021-02-19 VITALS — BP 138/81 | HR 75 | Wt 278.0 lb

## 2021-02-19 DIAGNOSIS — N898 Other specified noninflammatory disorders of vagina: Secondary | ICD-10-CM | POA: Diagnosis not present

## 2021-02-19 NOTE — Progress Notes (Signed)
Here with complaint of vaginal odor. History significant for recurrent BV. Reports vaginal spotting on 01/26/21. Bleeding like a period began on 02/04/21 and has continued until today. Pt reports this bleeding pattern is not abnormal. Patient has concerns related to PCOS diagnosis. Pt states she did not know she had PCOS until her cardiologist mentioned this to her recently. Would like to talk with provider about this. Has desired pregnancy in the past and is interested to know if this would affect her fertility. Does not desire pregnancy at this time. Interested in contraception. Self swab instructions given and specimen obtained. Explained we will contact pt with any abnormal results.  Fleet Contras RN 02/19/21

## 2021-02-20 ENCOUNTER — Other Ambulatory Visit: Payer: Self-pay | Admitting: Obstetrics and Gynecology

## 2021-02-20 DIAGNOSIS — B9689 Other specified bacterial agents as the cause of diseases classified elsewhere: Secondary | ICD-10-CM

## 2021-02-20 LAB — CERVICOVAGINAL ANCILLARY ONLY
Bacterial Vaginitis (gardnerella): POSITIVE — AB
Candida Glabrata: NEGATIVE
Candida Vaginitis: NEGATIVE
Chlamydia: NEGATIVE
Comment: NEGATIVE
Comment: NEGATIVE
Comment: NEGATIVE
Comment: NEGATIVE
Comment: NEGATIVE
Comment: NORMAL
Neisseria Gonorrhea: NEGATIVE
Trichomonas: NEGATIVE

## 2021-02-20 MED ORDER — METRONIDAZOLE 0.75 % VA GEL: 1.0000 | Freq: Every day | VAGINAL | 1 refills | Status: DC

## 2021-03-11 DIAGNOSIS — R030 Elevated blood-pressure reading, without diagnosis of hypertension: Secondary | ICD-10-CM | POA: Diagnosis not present

## 2021-03-11 DIAGNOSIS — R0602 Shortness of breath: Secondary | ICD-10-CM | POA: Diagnosis not present

## 2021-03-11 DIAGNOSIS — Z0000 Encounter for general adult medical examination without abnormal findings: Secondary | ICD-10-CM | POA: Diagnosis not present

## 2021-03-11 DIAGNOSIS — Z6841 Body Mass Index (BMI) 40.0 and over, adult: Secondary | ICD-10-CM | POA: Diagnosis not present

## 2021-03-11 DIAGNOSIS — E559 Vitamin D deficiency, unspecified: Secondary | ICD-10-CM | POA: Diagnosis not present

## 2021-03-11 DIAGNOSIS — Z79899 Other long term (current) drug therapy: Secondary | ICD-10-CM | POA: Diagnosis not present

## 2021-03-11 DIAGNOSIS — R5383 Other fatigue: Secondary | ICD-10-CM | POA: Diagnosis not present

## 2021-03-12 ENCOUNTER — Encounter: Payer: Self-pay | Admitting: Obstetrics & Gynecology

## 2021-03-12 ENCOUNTER — Other Ambulatory Visit: Payer: Self-pay

## 2021-03-12 ENCOUNTER — Ambulatory Visit (INDEPENDENT_AMBULATORY_CARE_PROVIDER_SITE_OTHER): Payer: Medicaid Other | Admitting: Obstetrics & Gynecology

## 2021-03-12 ENCOUNTER — Other Ambulatory Visit (HOSPITAL_COMMUNITY)
Admission: RE | Admit: 2021-03-12 | Discharge: 2021-03-12 | Disposition: A | Discharge status: Home / Self Care | Payer: Medicaid Other | Source: Ambulatory Visit | Attending: Obstetrics & Gynecology | Admitting: Obstetrics & Gynecology

## 2021-03-12 VITALS — BP 139/80 | HR 90 | Ht 63.0 in | Wt 282.5 lb

## 2021-03-12 DIAGNOSIS — N760 Acute vaginitis: Secondary | ICD-10-CM

## 2021-03-12 DIAGNOSIS — N939 Abnormal uterine and vaginal bleeding, unspecified: Secondary | ICD-10-CM

## 2021-03-12 DIAGNOSIS — Z113 Encounter for screening for infections with a predominantly sexual mode of transmission: Secondary | ICD-10-CM | POA: Diagnosis not present

## 2021-03-12 MED ORDER — MEGESTROL ACETATE 40 MG PO TABS: 80.0000 mg | ORAL_TABLET | Freq: Every day | ORAL | 5 refills | Status: DC

## 2021-03-12 NOTE — Progress Notes (Signed)
   GYNECOLOGY OFFICE VISIT NOTE  History:   Julie Hendrix is a 35 y.o. [phone] here today for management of abnormal uterine bleeding (AUB) that stated on 02/02/2021. Had bleeding since then, sometimes with clots.  No presyncopal symptoms but history of anemia and wants to check her blood and iron levels.  Also recently had BV,  also wants STI screening due to recent unprotected intercourse. She denies any pelvic pain or other concerns.    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 Started 12/2018, trying to quit.     Past Surgical History:  Procedure Laterality Date   COLPOSCOPY     CRYOTHERAPY  06/08/2017   cervix    The following portions of the patient's history were reviewed and updated as appropriate: allergies, current medications, past family history, past medical history, past social history, past surgical history and problem list.   Health Maintenance:  Normal pap and negative HRHPV on 06/20/2019.    Review of Systems:  Pertinent items noted in HPI and remainder of comprehensive ROS otherwise negative.  Physical Exam:  BP 139/80   Pulse 90   Ht 5\' 3"  (1.6 m)   Wt 282 lb 8 oz (128.1 kg)   LMP 02/02/2021 (Exact Date)   BMI 50.04 kg/m  CONSTITUTIONAL: Well-developed, well-nourished female in no acute distress.  HEENT:  Normocephalic, atraumatic. External right and left ear normal. No scleral icterus.  NECK: Normal range of motion, supple, no masses noted on observation SKIN: No rash noted. Not diaphoretic. No erythema. No pallor. MUSCULOSKELETAL: Normal range of motion. No edema noted. NEUROLOGIC: Alert and oriented to person, place, and time. Normal muscle tone coordination. No cranial nerve deficit  noted. PSYCHIATRIC: Normal mood and affect. Normal behavior. Normal judgment and thought content. CARDIOVASCULAR: Normal heart rate noted RESPIRATORY: Effort and breath sounds normal, no problems with respiration noted ABDOMEN: No masses noted. No other overt distention noted.   PELVIC: Deferred    Assessment and Plan:    1. Screening for STD (sexually transmitted disease) STI screen done.   Safe sex practices recommended. - Cervicovaginal ancillary only( [place]) - RPR+HBsAg+HCVAb+...  2. Abnormal uterine bleeding (AUB) Labs done, ultrasound ordered. Will follow up results and manage accordingly. Megace ordered for AUB control, will monitor effect. Bleeding precautions advised. - CBC - Ferritin - 02/04/2021 PELVIC COMPLETE WITH TRANSVAGINAL; Future - Beta hCG quant (ref lab) - megestrol (MEGACE) 40 MG tablet; Take 2 tablets (80 mg total) by mouth daily. Can increase to two tablets twice a day in the event of heavy bleeding  Dispense: 60 tablet; Refill: 5  Routine preventative health maintenance measures emphasized. Please refer to After Visit Summary for other counseling recommendations.   Return for any gynecologic concerns.    I spent 25 minutes dedicated to the care of this patient including pre-visit review of records, face to face time with the patient discussing her conditions and treatments and post visit orders.    [place], MD, FACOG Obstetrician & Gynecologist, [facility] for [facility]

## 2021-03-13 LAB — CBC
Hematocrit: 37.2 % (ref 34.0–46.6)
Hemoglobin: 12.2 g/dL (ref 11.1–15.9)
MCH: 27.8 pg (ref 26.6–33.0)
MCHC: 32.8 g/dL (ref 31.5–35.7)
MCV: 85 fL (ref 79–97)
Platelets: 298 10*3/uL (ref 150–450)
RBC: 4.39 x10E6/uL (ref 3.77–5.28)
RDW: 14 % (ref 11.7–15.4)
WBC: 10.6 10*3/uL (ref 3.4–10.8)

## 2021-03-13 LAB — BETA HCG QUANT (REF LAB): hCG Quant: <1 m[IU]/mL

## 2021-03-13 LAB — RPR+HBSAG+HCVAB+...
HIV Screen 4th Generation wRfx: NONREACTIVE
Hep C Virus Ab: 0.2 s/co ratio (ref 0.0–0.9)
Hepatitis B Surface Ag: NEGATIVE
RPR Ser Ql: NONREACTIVE

## 2021-03-13 LAB — FERRITIN: Ferritin: 19 ng/mL (ref 15–150)

## 2021-03-14 LAB — CERVICOVAGINAL ANCILLARY ONLY
Bacterial Vaginitis (gardnerella): POSITIVE — AB
Candida Glabrata: NEGATIVE
Candida Vaginitis: NEGATIVE
Chlamydia: NEGATIVE
Comment: NEGATIVE
Comment: NEGATIVE
Comment: NEGATIVE
Comment: NEGATIVE
Comment: NEGATIVE
Comment: NORMAL
Neisseria Gonorrhea: NEGATIVE
Trichomonas: NEGATIVE

## 2021-03-18 MED ORDER — METRONIDAZOLE 0.75 % VA GEL: 1.0000 | Freq: Every day | VAGINAL | 5 refills | Status: DC

## 2021-03-18 NOTE — Addendum Note (Signed)
Addended by: Jaynie Collins A on: 03/18/2021 12:55 PM   Modules accepted: Orders

## 2021-03-20 ENCOUNTER — Ambulatory Visit: Admission: RE | Admit: 2021-03-20 | Payer: Medicaid Other | Source: Ambulatory Visit

## 2021-03-31 ENCOUNTER — Other Ambulatory Visit: Payer: Self-pay | Admitting: Family Medicine

## 2021-03-31 DIAGNOSIS — I10 Essential (primary) hypertension: Secondary | ICD-10-CM

## 2021-04-16 DIAGNOSIS — R635 Abnormal weight gain: Secondary | ICD-10-CM | POA: Diagnosis not present

## 2021-04-16 DIAGNOSIS — Z6841 Body Mass Index (BMI) 40.0 and over, adult: Secondary | ICD-10-CM | POA: Diagnosis not present

## 2021-04-16 DIAGNOSIS — I10 Essential (primary) hypertension: Secondary | ICD-10-CM | POA: Diagnosis not present

## 2021-04-28 ENCOUNTER — Other Ambulatory Visit: Payer: Self-pay

## 2021-04-30 ENCOUNTER — Other Ambulatory Visit: Payer: Self-pay

## 2021-04-30 ENCOUNTER — Encounter (HOSPITAL_COMMUNITY): Payer: Self-pay

## 2021-04-30 ENCOUNTER — Ambulatory Visit (HOSPITAL_COMMUNITY)
Admission: EM | Admit: 2021-04-30 | Discharge: 2021-04-30 | Disposition: A | Discharge status: Home / Self Care | Payer: Medicaid Other | Attending: Family Medicine | Admitting: Family Medicine

## 2021-04-30 DIAGNOSIS — M542 Cervicalgia: Secondary | ICD-10-CM | POA: Diagnosis not present

## 2021-04-30 MED ORDER — PREDNISONE 10 MG PO TABS: ORAL_TABLET | ORAL | 0 refills | Status: AC

## 2021-04-30 NOTE — ED Triage Notes (Signed)
Pt presents with bilateral shoulder, neck, and back pain since yesterday.

## 2021-04-30 NOTE — ED Provider Notes (Signed)
[facility]    CSN: [id number] Arrival date & time: 04/30/21  0849      History   Chief Complaint Chief Complaint  Patient presents with   Neck Pain   Back Pain   Shoulder Pain    HPI Julie Hendrix is a 36 y.o. female.   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 Started 12/2018, trying to quit.     Patient Active Problem List   Diagnosis Date Noted   Chest pain of uncertain etiology 0000000   Chest discomfort 10/31/2020   Back pain 09/06/2019   Gastroesophageal reflux disease 06/06/2019   Essential hypertension 05/18/2018   Morbid obesity with body mass index (BMI) of 45.0 to 49.9 in adult [facility]) 11/30/2017   PCOS (polycystic ovarian syndrome) 11/30/2017   Low grade squamous intraepithelial lesion on cytologic smear of cervix (LGSIL) 05/21/2017   Breast lump on right side at 10 o'clock position 01/31/2013    Past Surgical History:  Procedure Laterality Date   COLPOSCOPY     CRYOTHERAPY  06/08/2017   cervix    OB History     Gravida  3   Para  2   Term  2   Preterm  0   AB  1   Living  2      SAB  0   IAB  1   Ectopic  0   Multiple  0   Live Births  2            Home  Medications    Prior to Admission medications   Medication Sig Start Date End Date Taking? Authorizing Provider  predniSONE (DELTASONE) 10 MG tablet Take 6 tablets (60 mg total) by mouth daily for 1 day, THEN 5 tablets (50 mg total) daily for 1 day, THEN 4 tablets (40 mg total) daily for 1 day, THEN 3 tablets (30 mg total) daily for 1 day, THEN 2 tablets (20 mg total) daily for 1 day, THEN 1 tablet (10 mg total) daily for 1 day. 04/30/21 05/06/21 Yes Mayjor Ager, Bernita Raisin, DO  amLODipine (NORVASC) 10 MG tablet TAKE 1 TABLET(10 MG) BY MOUTH DAILY 04/03/21   Eppie Gibson, MD  aspirin EC 81 MG tablet Take 81 mg by mouth daily.    [provider]  hydrochlorothiazide (HYDRODIURIL) 12.5 MG tablet Take 1 tablet (12.5 mg total) by mouth daily. 10/30/20   Gifford Shave, MD  megestrol (MEGACE) 40 MG tablet Take 2 tablets (80 mg total) by mouth daily. Can increase to two tablets twice a day in the event of heavy bleeding 03/12/21   Anyanwu, Sallyanne Havers, MD  metroNIDAZOLE (METROGEL) 0.75 % vaginal gel Place 1 Applicatorful vaginally at bedtime. Apply one  applicatorful to vagina at bedtime for 10 days, then twice a week for 6 months. 03/18/21   Osborne Oman, MD    Family History Family History  Problem Relation Age of Onset   Hypertension Father    Diabetes Father    Heart attack Father    Hypertension Mother     Social History Social History   Tobacco Use   Smoking status: Former    Packs/day: 0.25    Types: Cigarettes, Cigars    Quit date: 05/21/2020    Years since quitting: 0.9   Smokeless tobacco: Never   Tobacco comments:    black and mild  Vaping Use   Vaping Use: Never used  Substance Use Topics   Alcohol use: Yes    Alcohol/week: 9.0 standard drinks    Types: 3 Glasses of wine, 3 Cans of beer, 3 Shots of liquor per week   Drug use: No     Allergies   Patient has no known allergies.   Review of Systems Review of Systems  All other systems reviewed and are  negative. Per HPI  Physical Exam Triage Vital Signs ED Triage Vitals  Enc Vitals Group     BP 04/30/21 0935 (!) 143/96     Pulse Rate 04/30/21 0935 90     Resp 04/30/21 0935 18     Temp 04/30/21 0935 98.6 F (37 C)     Temp Source 04/30/21 0935 Oral     SpO2 04/30/21 0935 95 %     Weight --      Height --      Head Circumference --      Peak Flow --      Pain Score 04/30/21 0937 9     Pain Loc --      Pain Edu? --      Excl. in [place]? --    No data found.  Updated Vital Signs BP (!) 143/96 (BP Location: Left Arm)    Pulse 90    Temp 98.6 F (37 C) (Oral)    Resp 18    LMP  (LMP Unknown)    SpO2 95%   Visual Acuity Right Eye Distance:   Left Eye Distance:   Bilateral Distance:    Right Eye Near:   Left Eye Near:    Bilateral Near:     Physical Exam Constitutional:      General: Julie Hendrix is not in acute distress.    Appearance: Normal appearance. Julie Hendrix is not ill-appearing.  HENT:     Head: Normocephalic and atraumatic.  Eyes:     Conjunctiva/sclera: Conjunctivae normal.  Neck:     Comments: Neck/Back: - Inspection: no gross deformity or asymmetry, swelling or ecchymosis - Palpation: No TTP spinous process, throughout bilateral paraspinal musculature of the cervical spine including rhomboids and trapezius with a tender point at the mid scapular level on the left - ROM: full active ROM of the cervical spine with neck extension, rotation, flexion - pain in all directions - Strength: 5/5 wrist flexion, extension, biceps flexion, triceps extension. OK sign, interosseus strength intact  - Neuro: sensation intact in the C5-C8 nerve root distribution b/l, 2+ C5-C7 reflexes - Special testing: negative spurling's  Julie Hendrix has full range of motion of her bilateral shoulders without pain   Cardiovascular:     Rate and Rhythm: Normal rate.  Pulmonary:     Effort: Pulmonary effort is normal. No respiratory distress.  Musculoskeletal:        General:  No swelling.     Cervical back:  Neck supple. No rigidity or tenderness.  Lymphadenopathy:     Cervical: No cervical adenopathy.  Skin:    General: Skin is warm and dry.     Capillary Refill: Capillary refill takes less than 2 seconds.  Neurological:     General: No focal deficit present.     Mental Status: Julie Hendrix is alert and oriented to person, place, and time.     Sensory: No sensory deficit.  Psychiatric:        Mood and Affect: Mood normal.     UC Treatments / Results  Labs (all labs ordered are listed, but only abnormal results are displayed) Labs Reviewed - No data to display  EKG   Radiology No results found.  Procedures Procedures (including critical care time)  Medications Ordered in UC Medications - No data to display  Initial Impression / Assessment and Plan / UC Course  I have reviewed the triage vital signs and the nursing notes.  Pertinent labs & imaging results that were available during my care of the patient were reviewed by me and considered in my medical decision making (see chart for details).     Neck pain without radicular symptoms, most likely related to facet irritation.  No history of diabetes, will treat with a prednisone taper for 6 days.  Recommend good neck positioning and pillow usage at night.  Also recommend heat and massage.  Given ED precautions, see AVS.   Final Clinical Impressions(s) / UC Diagnoses   Final diagnoses:  Neck pain     Discharge Instructions      As we discussed, the pain that you are having is likely related to an irritation within your neck.  I have sent a prescription for prednisone which will help calm this down.  Continue to use heat and massage can also be helpful.  I have written you off of work for the next 2 days.  Try to rest so that you will be able to go back.  Make sure that you have a good pillow in your neck is in good alignment while you are sleeping.  If your pain changes, especially if you develop chest pain or difficulty breathing,  you need to be seen at the emergency room right away.     ED Prescriptions     Medication Sig Dispense Auth. Provider   predniSONE (DELTASONE) 10 MG tablet Take 6 tablets (60 mg total) by mouth daily for 1 day, THEN 5 tablets (50 mg total) daily for 1 day, THEN 4 tablets (40 mg total) daily for 1 day, THEN 3 tablets (30 mg total) daily for 1 day, THEN 2 tablets (20 mg total) daily for 1 day, THEN 1 tablet (10 mg total) daily for 1 day. 21 tablet Feige Lowdermilk, Bernita Raisin, DO      PDMP not reviewed this encounter.   MeccarielloBernita Raisin, DO 04/30/21 1035

## 2021-04-30 NOTE — Discharge Instructions (Signed)
As we discussed, the pain that you are having is likely related to an irritation within your neck.  I have sent a prescription for prednisone which will help calm this down.  Continue to use heat and massage can also be helpful.  I have written you off of work for the next 2 days.  Try to rest so that you will be able to go back.  Make sure that you have a good pillow in your neck is in good alignment while you are sleeping.  If your pain changes, especially if you develop chest pain or difficulty breathing, you need to be seen at the emergency room right away.

## 2021-05-05 ENCOUNTER — Other Ambulatory Visit: Payer: Self-pay

## 2021-05-05 ENCOUNTER — Ambulatory Visit
Admission: RE | Admit: 2021-05-05 | Discharge: 2021-05-05 | Disposition: A | Discharge status: Home / Self Care | Payer: Medicaid Other | Source: Ambulatory Visit | Attending: Obstetrics & Gynecology | Admitting: Obstetrics & Gynecology

## 2021-05-05 DIAGNOSIS — N939 Abnormal uterine and vaginal bleeding, unspecified: Secondary | ICD-10-CM | POA: Insufficient documentation

## 2021-07-03 ENCOUNTER — Ambulatory Visit (HOSPITAL_COMMUNITY): Payer: Medicaid Other

## 2021-07-03 ENCOUNTER — Ambulatory Visit (INDEPENDENT_AMBULATORY_CARE_PROVIDER_SITE_OTHER): Payer: Medicaid Other

## 2021-07-03 ENCOUNTER — Encounter (HOSPITAL_COMMUNITY): Payer: Self-pay | Admitting: *Deleted

## 2021-07-03 ENCOUNTER — Other Ambulatory Visit: Payer: Self-pay

## 2021-07-03 ENCOUNTER — Ambulatory Visit (HOSPITAL_COMMUNITY)
Admission: EM | Admit: 2021-07-03 | Discharge: 2021-07-03 | Disposition: A | Discharge status: Home / Self Care | Payer: Medicaid Other | Attending: Physician Assistant | Admitting: Physician Assistant

## 2021-07-03 DIAGNOSIS — Z043 Encounter for examination and observation following other accident: Secondary | ICD-10-CM | POA: Diagnosis not present

## 2021-07-03 DIAGNOSIS — T148XXA Other injury of unspecified body region, initial encounter: Secondary | ICD-10-CM | POA: Diagnosis not present

## 2021-07-03 DIAGNOSIS — W19XXXA Unspecified fall, initial encounter: Secondary | ICD-10-CM | POA: Diagnosis not present

## 2021-07-03 DIAGNOSIS — M25552 Pain in left hip: Secondary | ICD-10-CM

## 2021-07-03 MED ORDER — NAPROXEN 500 MG PO TABS: 500.0000 mg | ORAL_TABLET | Freq: Two times a day (BID) | ORAL | 0 refills | Status: DC

## 2021-07-03 MED ORDER — TIZANIDINE HCL 4 MG PO TABS: 4.0000 mg | ORAL_TABLET | Freq: Three times a day (TID) | ORAL | 0 refills | Status: DC | PRN

## 2021-07-03 NOTE — ED Triage Notes (Signed)
Pt reports falling on Sat . Pt was standing on a chair and fell backwards and hit a mop bucket or possible a table. Pt reports she has bruiseing on Lt side and a knot. Bruise on lt flank with a knot above bruise. ?

## 2021-07-03 NOTE — Discharge Instructions (Signed)
Your x-ray was normal.  I believe that you have a contusion and blood collection (hematoma) causing your pain.  Use warm compresses and gentle stretch on this area.  Take Naprosyn twice daily.  You should not take NSAIDs including aspirin, ibuprofen/Advil, naproxen/Aleve with this medication as can cause stomach bleeding.  You can use the Zanaflex which is a muscle relaxer up to 3 times a day.  This will make you sleepy so do not drive or drink alcohol while taking this.  If anything worsens you need to return for reevaluation. ?

## 2021-07-03 NOTE — ED Provider Notes (Signed)
[facility]    CSN: [id number] Arrival date & time: 07/03/21  1424      History   Chief Complaint Chief Complaint  Patient presents with   Fall    HPI [facility] Julie Hendrix is a 36 y.o. female.   Patient presents today with a 4-day history of left hip pain following injury.  Reports that she was standing on a chair dancing when the chair fell out from under her and she landed with the majority of her weight on her left hip.  She is confident she did not hit her head and denies any loss of consciousness, nausea/vomiting, headache, dizziness amnesia surrounding event, visual change.  She reports pain has gradually been improving but continues to have significant swelling and bruising prompting evaluation today.  Pain is rated 6 on a 0-10 pain scale, described as aching, worse with palpation or movement, no alleviating factors identified.  She denies any lower extremity weakness or difficulty ambulating.  She is confident that she is not pregnant.  She has been taking Motrin with improvement but not resolution of symptoms.   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 Started 12/2018, trying to quit.     Patient Active Problem List   Diagnosis Date Noted   Chest pain of uncertain etiology 01/15/2021   Chest discomfort 10/31/2020   Back pain 09/06/2019   Gastroesophageal reflux disease 06/06/2019   Essential hypertension 05/18/2018   Morbid obesity with body mass index (BMI) of 45.0 to 49.9 in adult [facility]) 11/30/2017   PCOS (polycystic ovarian syndrome) 11/30/2017   Low grade squamous intraepithelial lesion on cytologic smear of cervix (LGSIL) 05/21/2017   Breast lump on right side at 10 o'clock position 01/31/2013     Past Surgical History:  Procedure Laterality Date   COLPOSCOPY     CRYOTHERAPY  06/08/2017   cervix    OB History     Gravida  3   Para  2   Term  2   Preterm  0   AB  1   Living  2      SAB  0   IAB  1   Ectopic  0   Multiple  0   Live Births  2            Home Medications    Prior to Admission medications   Medication Sig Start Date End Date Taking? Authorizing Provider  naproxen (NAPROSYN) 500 MG tablet Take 1 tablet (500 mg total) by mouth 2 (two) times daily. 07/03/21  Yes Shalin Linders K, PA-C  tiZANidine (ZANAFLEX) 4 MG tablet Take 1 tablet (4 mg total) by mouth every 8 (eight) hours as needed for muscle spasms. 07/03/21  Yes Danee Soller K, PA-C  amLODipine (NORVASC) 10 MG tablet TAKE 1 TABLET(10 MG) BY MOUTH DAILY 04/03/21   Alicia Amel, MD  aspirin EC 81 MG tablet Take 81 mg by mouth daily.    [provider]  hydrochlorothiazide (HYDRODIURIL) 12.5 MG tablet Take 1 tablet (12.5 mg total) by mouth daily. 10/30/20   Derrel Nip, MD  megestrol (MEGACE) 40 MG tablet Take 2 tablets (80 mg total) by mouth daily. Can increase to two tablets  twice a day in the event of heavy bleeding 03/12/21   Anyanwu, Jethro Bastos, MD  metroNIDAZOLE (METROGEL) 0.75 % vaginal gel Place 1 Applicatorful vaginally at bedtime. Apply one applicatorful to vagina at bedtime for 10 days, then twice a week for 6 months. 03/18/21   Tereso Newcomer, MD    Family History Family History  Problem Relation Age of Onset   Hypertension Father    Diabetes Father    Heart attack Father    Hypertension Mother     Social History Social History   Tobacco Use   Smoking status: Former    Packs/day: 0.25    Types: Cigarettes, Cigars    Quit date: 05/21/2020    Years since quitting: 1.1   Smokeless tobacco: Never   Tobacco comments:    black and mild  Vaping Use   Vaping Use: Never used  Substance Use Topics   Alcohol use: Yes    Alcohol/week: 9.0 standard drinks     Types: 3 Glasses of wine, 3 Cans of beer, 3 Shots of liquor per week   Drug use: No     Allergies   Patient has no known allergies.   Review of Systems Review of Systems  Constitutional:  Positive for activity change. Negative for appetite change, fatigue and fever.  Eyes:  Negative for visual disturbance.  Respiratory:  Negative for cough and shortness of breath.   Cardiovascular:  Negative for chest pain.  Gastrointestinal:  Negative for abdominal pain, diarrhea, nausea and vomiting.  Musculoskeletal:  Positive for arthralgias. Negative for gait problem, joint swelling and myalgias.  Skin:  Positive for color change (Bruising). Negative for wound.  Neurological:  Negative for dizziness, light-headedness and headaches.    Physical Exam Triage Vital Signs ED Triage Vitals  Enc Vitals Group     BP 07/03/21 1455 126/86     Pulse Rate 07/03/21 1455 82     Resp 07/03/21 1455 18     Temp 07/03/21 1455 98.3 F (36.8 C)     Temp src --      SpO2 07/03/21 1455 100 %     Weight --      Height --      Head Circumference --      Peak Flow --      Pain Score 07/03/21 1453 6     Pain Loc --      Pain Edu? --      Excl. in GC? --    No data found.  Updated Vital Signs BP 126/86   Pulse 82   Temp 98.3 F (36.8 C)   Resp 18   LMP 06/28/2021   SpO2 100%   Visual Acuity Right Eye Distance:   Left Eye Distance:   Bilateral Distance:    Right Eye Near:   Left Eye Near:    Bilateral Near:     Physical Exam Vitals reviewed.  Constitutional:      General: She is awake. She is not in acute distress.    Appearance: Normal appearance. She is well-developed. She is not ill-appearing.     Comments: Very pleasant female appears stated age no acute distress sitting comfortably in exam room  HENT:     Head: Normocephalic and atraumatic.  Cardiovascular:     Rate and Rhythm: Normal rate and regular rhythm.     Heart sounds: Normal heart sounds, S1 normal and S2 normal. No  murmur heard. Pulmonary:     Effort: Pulmonary effort  is normal.     Breath sounds: Normal breath sounds. No wheezing, rhonchi or rales.     Comments: Clear to auscultation bilaterally Abdominal:     Palpations: Abdomen is soft.     Tenderness: There is no abdominal tenderness.  Musculoskeletal:     Cervical back: No tenderness or bony tenderness.     Thoracic back: No tenderness or bony tenderness.     Lumbar back: No tenderness or bony tenderness. Normal range of motion. No scoliosis.     Left hip: Tenderness present. No deformity or bony tenderness. Normal range of motion.     Comments: Tenderness to palpation over iliac crest with associated bruising.  Mild hematoma formation noted.  Normal active range of motion at hip.  No pain percussion of lumbar vertebrae.  Psychiatric:        Behavior: Behavior is cooperative.     UC Treatments / Results  Labs (all labs ordered are listed, but only abnormal results are displayed) Labs Reviewed - No data to display  EKG   Radiology DG Hip Unilat With Pelvis 2-3 Views Left  Result Date: 07/03/2021 CLINICAL DATA:  Fall EXAM: DG HIP (WITH OR WITHOUT PELVIS) 2-3V LEFT COMPARISON:  None. FINDINGS: Suboptimal visualization of bony detail due to body habitus. No acute fracture identified. Alignment appears anatomic. Joint spaces are preserved. IMPRESSION: No acute fracture. Electronically Signed   By: Guadlupe Spanish M.D.   On: 07/03/2021 15:54    Procedures Procedures (including critical care time)  Medications Ordered in UC Medications - No data to display  Initial Impression / Assessment and Plan / UC Course  I have reviewed the triage vital signs and the nursing notes.  Pertinent labs & imaging results that were available during my care of the patient were reviewed by me and considered in my medical decision making (see chart for details).     No indication for head or cervical spine CT based on [place] CT rules.  X-ray obtained of  left hip given persistent pain and bruising which showed no osseous abnormality.  Discussed symptoms are likely related to contusion/hematoma.  Recommended rest as well as warm compresses for symptom relief.  She was started on Naprosyn twice daily with instruction not to take additional NSAIDs with this medication due to risk of GI bleeding.  She was also prescribed Zanaflex to be used up to 3 times a day with instruction not to drive or drink alcohol while taking this medication as drowsiness is a common side effect.  Discussed that if symptoms or not improving she should follow-up for reevaluation.  If she has any worsening symptoms including worsening pain, difficulty ambulating she needs to be seen immediately.  Strict return precautions given to which she expressed understanding.  Work excuse note provided.  Final Clinical Impressions(s) / UC Diagnoses   Final diagnoses:  Fall  Left hip pain  Hematoma  Fall, initial encounter     Discharge Instructions      Your x-ray was normal.  I believe that you have a contusion and blood collection (hematoma) causing your pain.  Use warm compresses and gentle stretch on this area.  Take Naprosyn twice daily.  You should not take NSAIDs including aspirin, ibuprofen/Advil, naproxen/Aleve with this medication as can cause stomach bleeding.  You can use the Zanaflex which is a muscle relaxer up to 3 times a day.  This will make you sleepy so do not drive or drink alcohol while taking this.  If anything  worsens you need to return for reevaluation.     ED Prescriptions     Medication Sig Dispense Auth. Provider   tiZANidine (ZANAFLEX) 4 MG tablet Take 1 tablet (4 mg total) by mouth every 8 (eight) hours as needed for muscle spasms. 30 tablet Phyllicia Dudek K, PA-C   naproxen (NAPROSYN) 500 MG tablet Take 1 tablet (500 mg total) by mouth 2 (two) times daily. 20 tablet Lakya Schrupp, Julie Retort, PA-C      PDMP not reviewed this encounter.   Julie Hawking,  PA-C 07/03/21 1606

## 2021-08-19 ENCOUNTER — Ambulatory Visit (INDEPENDENT_AMBULATORY_CARE_PROVIDER_SITE_OTHER): Payer: Medicaid Other

## 2021-08-19 ENCOUNTER — Other Ambulatory Visit (HOSPITAL_COMMUNITY)
Admission: RE | Admit: 2021-08-19 | Discharge: 2021-08-19 | Disposition: A | Discharge status: Home / Self Care | Payer: Medicaid Other | Source: Ambulatory Visit | Attending: Family Medicine | Admitting: Family Medicine

## 2021-08-19 ENCOUNTER — Encounter: Payer: Medicaid Other | Admitting: Family Medicine

## 2021-08-19 VITALS — BP 144/85 | HR 79 | Wt 259.6 lb

## 2021-08-19 DIAGNOSIS — N898 Other specified noninflammatory disorders of vagina: Secondary | ICD-10-CM

## 2021-08-19 NOTE — Progress Notes (Signed)
Here today with complaint of vaginal odor and bleeding. Self swab instructions given and specimen obtained. Explained we will contact patient with any abnormal results. Pt reports skipping Megace tablets for days at a time to allow herself to bleed. Explained this is not needed and this medication is meant to be taken daily.  ? ?BP elevated today. Also reports pain in side that she intends to have evaluated at urgent care today. [facility] and appt made for today at 1410. Pt agreeable. ? Fleet Contras RN ?08/19/21 ?

## 2021-08-19 NOTE — Progress Notes (Deleted)
.  [facility] 

## 2021-08-20 ENCOUNTER — Ambulatory Visit (INDEPENDENT_AMBULATORY_CARE_PROVIDER_SITE_OTHER): Payer: Medicaid Other | Admitting: Family Medicine

## 2021-08-20 ENCOUNTER — Ambulatory Visit (HOSPITAL_COMMUNITY)
Admission: RE | Admit: 2021-08-20 | Discharge: 2021-08-20 | Disposition: A | Discharge status: Home / Self Care | Payer: Medicaid Other | Source: Ambulatory Visit | Attending: Family Medicine | Admitting: Family Medicine

## 2021-08-20 ENCOUNTER — Other Ambulatory Visit: Payer: Self-pay | Admitting: Lactation Services

## 2021-08-20 ENCOUNTER — Encounter: Payer: Self-pay | Admitting: Family Medicine

## 2021-08-20 VITALS — BP 126/80 | HR 82 | Wt 263.4 lb

## 2021-08-20 DIAGNOSIS — K219 Gastro-esophageal reflux disease without esophagitis: Secondary | ICD-10-CM

## 2021-08-20 DIAGNOSIS — R0789 Other chest pain: Secondary | ICD-10-CM | POA: Diagnosis not present

## 2021-08-20 LAB — CERVICOVAGINAL ANCILLARY ONLY
Bacterial Vaginitis (gardnerella): POSITIVE — AB
Candida Glabrata: NEGATIVE
Candida Vaginitis: NEGATIVE
Chlamydia: NEGATIVE
Comment: NEGATIVE
Comment: NEGATIVE
Comment: NEGATIVE
Comment: NEGATIVE
Comment: NEGATIVE
Comment: NORMAL
Neisseria Gonorrhea: NEGATIVE
Trichomonas: NEGATIVE

## 2021-08-20 MED ORDER — METRONIDAZOLE 500 MG PO TABS
500.0000 mg | ORAL_TABLET | Freq: Two times a day (BID) | ORAL | 0 refills | Status: DC
Start: 2021-08-20 — End: 2022-01-05

## 2021-08-20 MED ORDER — OMEPRAZOLE 20 MG PO CPDR: 20.0000 mg | DELAYED_RELEASE_CAPSULE | Freq: Every day | ORAL | 3 refills | Status: DC

## 2021-08-20 NOTE — Patient Instructions (Addendum)
It was wonderful to see you today. ? ?Please bring ALL of your medications with you to every visit.  ? ?Today we talked about: ? ?Starting omeprazole for acid reflux ?Please go to [facility] to get a chest x-ray this is likely chest wall pain due to overuse but I would like to get a chest x-ray to make sure nothing internally or bone wise is going on. ? ?Please be sure to schedule follow up at the front  desk before you leave today.  ? ?Please call the clinic at [phone] if your symptoms worsen or you have any concerns. It was our pleasure to serve you. ? ?Dr. Janus Molder ? ?

## 2021-08-20 NOTE — Progress Notes (Signed)
? ? ?  SUBJECTIVE:  ? ?CHIEF COMPLAINT / HPI:  ? 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  This was completely resolved the next day. Of note she did have a fall in February in which she was standing on a chair and hit the floor on her left side.  She did seek care at that time and hip was x-rayed and was normal.  She states the left pain is worse with deep breathing and leaning over.  Otherwise she feels fine denies shortness of breath and chest pain.  She does have a history of a pack to half a pack of cigarettes a day.  She states that she has quit 5 times before cold [place] and recently started smoking back over the past year.  Does have a history of acid reflux and endorses occasional burning sensation in the throat.  She is not currently taking anything for acid reflux. ? ?PERTINENT  PMH / PSH: LSIL, HTN ? ?OBJECTIVE:  ? ?BP 126/80   Pulse 82   Wt 263 lb 6.4 oz (119.5 kg)   SpO2 100%   BMI 46.66 kg/m?   ?Physical Exam ?Vitals reviewed.  ?Constitutional:   ?   General: She is not in acute distress. ?   Appearance: She is not ill-appearing, toxic-appearing or diaphoretic.  ?Cardiovascular:  ?   Rate and Rhythm: Normal rate and regular rhythm.  ?   Heart sounds: Normal heart sounds.  ?Pulmonary:  ?   Effort: Pulmonary effort is normal. No respiratory distress.  ?   Breath sounds: Normal breath sounds. No stridor. No wheezing or rhonchi.  ?Abdominal:  ?   General: Bowel sounds are normal. There is no distension.  ?   Palpations: Abdomen is soft. There is no mass.  ?   Tenderness: There is no abdominal tenderness. There is no right CVA tenderness, left CVA tenderness, guarding or rebound.  ?Musculoskeletal:  ?   Comments: No palpatory tenderness over the left upper quadrant.  Some tenderness with manipulating the shoulder.  ?Neurological:  ?   Mental Status: She is alert.  ? ?ASSESSMENT/PLAN:  ? ?1. Gastroesophageal reflux disease,  unspecified whether esophagitis present ?Possible left chest wall pain and burning sensation in throat due to acid reflux.  Patient has a diagnosis of this and has not been taking any medication.  We will start on the below. ?- omeprazole (PRILOSEC) 20 MG capsule; Take 1 capsule (20 mg total) by mouth daily.  Dispense: 30 capsule; Refill: 3 ? ?2. Chest wall pain ?Left upper quadrant tender with manipulating the shoulder.  Of note patient did have a fall on the left side from a chair.  Chest x-ray was not done at this time.  We will get chest x-ray given history of fall and patient's smoking history.  Consider rib fracture versus atelectasis.  Suspicion for this is low given lung exam is clear to auscultation bilaterally and no palpatory tenderness.  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  We will likely improve with rest.  If checks x-ray is abnormal will need closer follow-up. ?- DG Chest 2 View; Future ? ?Cristal Qadir Autry-Lott, DO ?[facility]  ?

## 2021-09-20 ENCOUNTER — Other Ambulatory Visit: Payer: Self-pay | Admitting: Family Medicine

## 2021-09-23 ENCOUNTER — Encounter: Payer: Self-pay | Admitting: *Deleted

## 2021-10-14 ENCOUNTER — Ambulatory Visit: Payer: Medicaid Other

## 2021-10-23 ENCOUNTER — Ambulatory Visit (INDEPENDENT_AMBULATORY_CARE_PROVIDER_SITE_OTHER): Payer: Medicaid Other

## 2021-10-23 ENCOUNTER — Other Ambulatory Visit (HOSPITAL_COMMUNITY)
Admission: RE | Admit: 2021-10-23 | Discharge: 2021-10-23 | Disposition: A | Discharge status: Home / Self Care | Payer: Medicaid Other | Source: Ambulatory Visit | Attending: Family Medicine | Admitting: Family Medicine

## 2021-10-23 ENCOUNTER — Other Ambulatory Visit: Payer: Self-pay

## 2021-10-23 VITALS — BP 136/79 | HR 79 | Wt 262.2 lb

## 2021-10-23 DIAGNOSIS — Z113 Encounter for screening for infections with a predominantly sexual mode of transmission: Secondary | ICD-10-CM | POA: Insufficient documentation

## 2021-10-23 DIAGNOSIS — N898 Other specified noninflammatory disorders of vagina: Secondary | ICD-10-CM

## 2021-10-23 NOTE — Progress Notes (Signed)
Here today with complaint of vaginal odor. Requests full STD screening. Reports recent vaginal itching that resolved with single Diflucan tablet patient had left from prior prescription. Self swab instructions and specimen obtained. Reviewed need for annual exam in November. Pt will call office to schedule.   Fleet Contras RN 10/23/21

## 2021-10-24 LAB — CERVICOVAGINAL ANCILLARY ONLY
Candida Glabrata: NEGATIVE
Candida Vaginitis: NEGATIVE
Chlamydia: NEGATIVE
Comment: NEGATIVE
Comment: NEGATIVE
Comment: NEGATIVE
Comment: NEGATIVE
Comment: NORMAL
Neisseria Gonorrhea: NEGATIVE
Trichomonas: NEGATIVE

## 2021-10-24 LAB — HEPATITIS C ANTIBODY: Hep C Virus Ab: NONREACTIVE

## 2021-10-24 LAB — HIV ANTIBODY (ROUTINE TESTING W REFLEX): HIV Screen 4th Generation wRfx: NONREACTIVE

## 2021-10-24 LAB — HEPATITIS B SURFACE ANTIGEN: Hepatitis B Surface Ag: NEGATIVE

## 2021-10-24 LAB — RPR: RPR Ser Ql: NONREACTIVE

## 2021-12-11 ENCOUNTER — Ambulatory Visit: Payer: Medicaid Other

## 2021-12-19 ENCOUNTER — Other Ambulatory Visit: Payer: Self-pay | Admitting: Student

## 2021-12-19 DIAGNOSIS — I10 Essential (primary) hypertension: Secondary | ICD-10-CM

## 2021-12-26 ENCOUNTER — Ambulatory Visit: Payer: Medicaid Other | Admitting: Student

## 2022-01-05 ENCOUNTER — Encounter: Payer: Self-pay | Admitting: Student

## 2022-01-05 ENCOUNTER — Ambulatory Visit (INDEPENDENT_AMBULATORY_CARE_PROVIDER_SITE_OTHER): Payer: Medicaid Other | Admitting: Student

## 2022-01-05 VITALS — BP 123/82 | HR 78 | Ht 63.0 in | Wt 266.2 lb

## 2022-01-05 DIAGNOSIS — Z6841 Body Mass Index (BMI) 40.0 and over, adult: Secondary | ICD-10-CM

## 2022-01-05 DIAGNOSIS — I10 Essential (primary) hypertension: Secondary | ICD-10-CM

## 2022-01-05 DIAGNOSIS — R7303 Prediabetes: Secondary | ICD-10-CM | POA: Diagnosis present

## 2022-01-05 LAB — POCT GLYCOSYLATED HEMOGLOBIN (HGB A1C): Hemoglobin A1C: 5.8 % — AB (ref 4.0–5.6)

## 2022-01-05 MED ORDER — SEMAGLUTIDE(0.25 OR 0.5MG/DOS) 2 MG/1.5ML ~~LOC~~ SOPN: 0.2500 mg | PEN_INJECTOR | SUBCUTANEOUS | 1 refills | Status: DC

## 2022-01-05 NOTE — Progress Notes (Signed)
    SUBJECTIVE:   CHIEF COMPLAINT / HPI:   Prediabetes Here for an A1c check. Reports that she has not been as stringently adherent to her lifestyle modifications in the last year.  Previously was going to the gym regularly and eating healthfully and had improved her A1c from prediabetic range to normal range.  However, notes that in the last year her weight has gone up and she has not been in the gym as frequently though she has maintained her membership.  HTN Reports good adherence to regimen of HCTZ 12.5 mg and amlodipine 10 mg daily.  Does not regularly check her BP at home.   OBJECTIVE:   BP 123/82   Pulse 78   Ht 5\' 3"  (1.6 m)   Wt 266 lb 3.2 oz (120.7 kg)   SpO2 100%   BMI 47.16 kg/m   Physical Exam Vitals reviewed.  Constitutional:      General: She is not in acute distress. Cardiovascular:     Rate and Rhythm: Normal rate and regular rhythm.     Heart sounds: No murmur heard.    No friction rub. No gallop.     Comments: Distal pulses 2+ in BLE Pulmonary:     Effort: Pulmonary effort is normal.     Breath sounds: Normal breath sounds.  Musculoskeletal:        General: No swelling.     Right lower leg: No edema.     Left lower leg: No edema.  Skin:    General: Skin is warm and dry.      ASSESSMENT/PLAN:   Essential hypertension Doing well on current regimen TZ 12.5 mg and amlodipine 10 mg daily.  No evidence of LE edema on exam. -Continue current regimen -BMP at next visit  Prediabetes A1c 5.5>5.8%.  Weight is also up 4 pounds since last visit.  BMI 47.16.  Discussed GLP-1 agonist with patient, she is amenable to starting.  Administration instructions and side effect monitoring discussed with patient. -Initiate semaglutide 0.25 mg weekly, patient may increase to 0.5 mg weekly after 1 month. -Follow-up in 2 months     Pearla Dubonnet, MD [place]

## 2022-01-05 NOTE — Assessment & Plan Note (Addendum)
A1c 5.5>5.8%.  Weight is also up 4 pounds since last visit.  BMI 47.16.  Discussed GLP-1 agonist with patient, she is amenable to starting.  Administration instructions and side effect monitoring discussed with patient. -Initiate semaglutide 0.25 mg weekly, patient may increase to 0.5 mg weekly after 1 month. -Follow-up in 2 months

## 2022-01-05 NOTE — Patient Instructions (Signed)
Julie Hendrix,  It is a joy to see you today.  Here is a recap of what we talked about: Your blood pressure looks great today!  Let us keep doing what we have been doing.  For your A1c and your weight, I would like to start you on a medication called semaglutide (brand name Ozempic or week of the).  This medication should help to decrease your appetite.  The primary side effect that people notices nausea.  Hopefully this last no more than 1 week or so.  If it is intolerable, please send me a MyChart message or call the clinic to let me know.  If you start to develop searing abdominal pain that radiates to your back, this would be an indication to return to care either in our clinic or in the emergency room as it could be a sign of a rare but dangerous side effect called pancreatitis.  After 4 weeks, you can increase your dose from 0.25 mg/week to 0.5 mg for a week.  I will see you back in 2 months.  Pearla Dubonnet, MD

## 2022-01-05 NOTE — Assessment & Plan Note (Signed)
Doing well on current regimen TZ 12.5 mg and amlodipine 10 mg daily.  No evidence of LE edema on exam. -Continue current regimen -BMP at next visit

## 2022-01-13 ENCOUNTER — Encounter (HOSPITAL_COMMUNITY): Payer: Self-pay

## 2022-01-13 ENCOUNTER — Ambulatory Visit (HOSPITAL_COMMUNITY)
Admission: EM | Admit: 2022-01-13 | Discharge: 2022-01-13 | Disposition: A | Discharge status: Home / Self Care | Payer: Medicaid Other | Attending: Internal Medicine | Admitting: Internal Medicine

## 2022-01-13 DIAGNOSIS — R051 Acute cough: Secondary | ICD-10-CM | POA: Insufficient documentation

## 2022-01-13 DIAGNOSIS — J069 Acute upper respiratory infection, unspecified: Secondary | ICD-10-CM | POA: Insufficient documentation

## 2022-01-13 DIAGNOSIS — J029 Acute pharyngitis, unspecified: Secondary | ICD-10-CM | POA: Insufficient documentation

## 2022-01-13 LAB — POCT RAPID STREP A, ED / UC: Streptococcus, Group A Screen (Direct): NEGATIVE

## 2022-01-13 MED ORDER — DM-GUAIFENESIN ER 30-600 MG PO TB12: 1.0000 | ORAL_TABLET | Freq: Two times a day (BID) | ORAL | 0 refills | Status: DC

## 2022-01-13 MED ORDER — FLUTICASONE PROPIONATE 50 MCG/ACT NA SUSP: 2.0000 | Freq: Every day | NASAL | 2 refills | Status: DC

## 2022-01-13 NOTE — ED Triage Notes (Signed)
Patient having nasal congestion, sore throat, and cough onset Saturday. States symptoms were worse on Sunday. States symptoms eased up Monday but came back today.   No known sick exposure, no one at home sick.

## 2022-01-13 NOTE — ED Provider Notes (Signed)
[facility]    CSN: [id number] Arrival date & time: 01/13/22  1354      History   Chief Complaint Chief Complaint  Patient presents with   Cough   Sore Throat   Nasal Congestion    HPI Julie Hendrix is a 37 y.o. female.   36 year old female presents with sore throat, cough and congestion.  Patient indicates that for the past several days she has been having increasing sore throat, painful swallowing.  She indicates that she has also been having some upper respiratory sinus congestion with frontal and maxillary pressure, postnasal drip, rhinitis with some mainly clear production.  She also indicates she has been having some chest congestion with intermittent cough, production is mainly been clear, she has also had some mild wheezing with her cough.  Patient indicates that she does smoke and this may be contributing to her wheezing.  She relates she is not having any fever or chills.  She indicates she has been taking some OTC medications but this is only helped her symptoms a little bit.  She denies being around any family or friends that have been sick.  She is tolerating fluids well, no nausea or vomiting.   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 Started 12/2018, trying to quit.     Patient Active Problem List   Diagnosis Date Noted   Chest pain of uncertain etiology 01/15/2021   Chest discomfort 10/31/2020   Back pain 09/06/2019   Gastroesophageal reflux disease 06/06/2019   Prediabetes 06/17/2018   Essential hypertension 05/18/2018   Morbid obesity with body mass index (BMI) of 45.0 to 49.9 in adult [facility]) 11/30/2017    PCOS (polycystic ovarian syndrome) 11/30/2017   Low grade squamous intraepithelial lesion on cytologic smear of cervix (LGSIL) 05/21/2017   Breast lump on right side at 10 o'clock position 01/31/2013    Past Surgical History:  Procedure Laterality Date   COLPOSCOPY     CRYOTHERAPY  06/08/2017   cervix    OB History     Gravida  3   Para  2   Term  2   Preterm  0   AB  1   Living  2      SAB  0   IAB  1   Ectopic  0   Multiple  0   Live Births  2            Home Medications    Prior to Admission medications   Medication Sig Start Date End Date Taking? Authorizing Provider  amLODipine (NORVASC) 10 MG tablet TAKE 1 TABLET(10 MG) BY MOUTH DAILY 12/19/21  Yes Alicia Amel, MD  dextromethorphan-guaiFENesin [facility] DM) 30-600 MG 12hr tablet Take 1 tablet by mouth 2 (two) times daily. 01/13/22  Yes Ellsworth Lennox, PA-C  fluticasone [facility]) 50 MCG/ACT nasal spray Place 2 sprays into both nostrils daily. 01/13/22  Yes Ellsworth Lennox, PA-C  hydrochlorothiazide (HYDRODIURIL) 12.5 MG tablet TAKE 1 TABLET(12.5 MG) BY MOUTH DAILY 09/22/21  Yes Alicia Amel, MD  megestrol (MEGACE) 40 MG tablet Take 40 mg by mouth daily as needed (  patient reports).   Yes [provider]  metroNIDAZOLE (METROGEL) 0.75 % vaginal gel Place 1 Applicatorful vaginally 2 (two) times daily as needed (vaginal irritation).    [provider]  Semaglutide,0.25 or 0.5MG /DOS, 2 MG/1.5ML SOPN Inject 0.25 mg into the skin once a week. 0.25 mg once weekly for 4 weeks then increase to 0.5 mg weekly for at least 4 weeks,max 1 mg 01/05/22   Alicia Amel, MD    Family History Family History  Problem Relation Age of Onset   Hypertension Father    Diabetes Father    Heart attack Father    Hypertension Mother     Social History Social History   Tobacco Use   Smoking status: Former    Packs/day: 0.25    Types: Cigarettes, Cigars    Quit date: 05/21/2020    Years since quitting:  1.6   Smokeless tobacco: Never   Tobacco comments:    black and mild  Vaping Use   Vaping Use: Never used  Substance Use Topics   Alcohol use: Yes    Alcohol/week: 9.0 standard drinks of alcohol    Types: 3 Glasses of wine, 3 Cans of beer, 3 Shots of liquor per week   Drug use: No     Allergies   Patient has no known allergies.   Review of Systems Review of Systems  HENT:  Positive for postnasal drip, rhinorrhea, sinus pressure and sore throat.   Respiratory:  Positive for cough.      Physical Exam Triage Vital Signs ED Triage Vitals  Enc Vitals Group     BP 01/13/22 1410 (!) 150/97     Pulse Rate 01/13/22 1410 78     Resp 01/13/22 1410 16     Temp 01/13/22 1410 98.4 F (36.9 C)     Temp Source 01/13/22 1410 Oral     SpO2 01/13/22 1410 100 %     Weight --      Height --      Head Circumference --      Peak Flow --      Pain Score 01/13/22 1413 6     Pain Loc --      Pain Edu? --      Excl. in GC? --    No data found.  Updated Vital Signs BP (!) 138/95 (BP Location: Left Arm)   Pulse 78   Temp 98.4 F (36.9 C) (Oral)   Resp 16   LMP 12/17/2021 (Approximate)   SpO2 100%   Visual Acuity Right Eye Distance:   Left Eye Distance:   Bilateral Distance:    Right Eye Near:   Left Eye Near:    Bilateral Near:     Physical Exam Constitutional:      Appearance: She is well-developed.  HENT:     Right Ear: Tympanic membrane and ear canal normal.     Left Ear: Tympanic membrane and ear canal normal.     Mouth/Throat:     Mouth: Mucous membranes are moist.     Pharynx: Oropharynx is clear. Posterior oropharyngeal erythema present. No oropharyngeal exudate.  Cardiovascular:     Rate and Rhythm: Normal rate and regular rhythm.     Heart sounds: Normal heart sounds.  Pulmonary:     Effort: Pulmonary effort is normal.     Breath sounds: Normal breath sounds and air entry. No wheezing, rhonchi or rales.  Lymphadenopathy:     Cervical: No cervical  adenopathy.  Neurological:  Mental Status: She is alert.      UC Treatments / Results  Labs (all labs ordered are listed, but only abnormal results are displayed) Labs Reviewed  CULTURE, GROUP A STREP [facility])  POCT RAPID STREP A, ED / UC    EKG   Radiology No results found.  Procedures Procedures (including critical care time)  Medications Ordered in UC Medications - No data to display  Initial Impression / Assessment and Plan / UC Course  I have reviewed the triage vital signs and the nursing notes.  Pertinent labs & imaging results that were available during my care of the patient were reviewed by me and considered in my medical decision making (see chart for details).       Plan: 1.  The acute viral upper respiratory infection will be treated with Mucinex DM every 12 hours for cough and chest congestion. 2.  The upper respiratory sinus and nasal congestion will be treated with Flonase nasal spray, 2 sprays each nostril once daily to help decrease the congestion and drainage. 3.  Patient has been advised to take Tylenol or ibuprofen for aches pains and discomfort. 4.  Throat culture is pending since the patient is having sore throat and the rapid test is negative. Advised to follow-up PCP or return to urgent care if symptoms fail to improve Final Clinical Impressions(s) / UC Diagnoses   Final diagnoses:  Viral upper respiratory tract infection  Pharyngitis, unspecified etiology  Acute cough     Discharge Instructions      This is a viral upper respiratory infection and should improve over the next 5 to 7 days. Advised to use the Flonase nasal spray, 2 sprays each nostril once a day to help decrease nasal and sinus congestion. Advised to use the Mucinex DM every 12 hours to help with chest congestion and cough. Advised to follow-up with PCP or return to urgent care if symptoms fail to improve.     ED Prescriptions     Medication Sig Dispense Auth.  Provider   dextromethorphan-guaiFENesin (MUCINEX DM) 30-600 MG 12hr tablet Take 1 tablet by mouth 2 (two) times daily. 14 tablet Nyoka Lint, PA-C   fluticasone [facility]) 50 MCG/ACT nasal spray Place 2 sprays into both nostrils daily. 11.1 g Nyoka Lint, PA-C      PDMP not reviewed this encounter.   Nyoka Lint, PA-C 01/13/22 1452

## 2022-01-13 NOTE — Discharge Instructions (Addendum)
This is a viral upper respiratory infection and should improve over the next 5 to 7 days. Advised to use the Flonase nasal spray, 2 sprays each nostril once a day to help decrease nasal and sinus congestion. Advised to use the Mucinex DM every 12 hours to help with chest congestion and cough. Advised to follow-up with PCP or return to urgent care if symptoms fail to improve.

## 2022-01-16 LAB — CULTURE, GROUP A STREP (THRC)

## 2022-04-30 ENCOUNTER — Other Ambulatory Visit (HOSPITAL_COMMUNITY)
Admission: RE | Admit: 2022-04-30 | Discharge: 2022-04-30 | Disposition: A | Discharge status: Home / Self Care | Payer: Medicaid Other | Source: Ambulatory Visit | Attending: Family Medicine | Admitting: Family Medicine

## 2022-04-30 ENCOUNTER — Ambulatory Visit (INDEPENDENT_AMBULATORY_CARE_PROVIDER_SITE_OTHER): Payer: Medicaid Other | Admitting: *Deleted

## 2022-04-30 VITALS — BP 135/85 | HR 94 | Ht 63.0 in | Wt 259.2 lb

## 2022-04-30 DIAGNOSIS — N898 Other specified noninflammatory disorders of vagina: Secondary | ICD-10-CM | POA: Insufficient documentation

## 2022-04-30 NOTE — Progress Notes (Signed)
Here for self swab- thinks she may have BV- having bad odor and yellowish vaginal discharge. Would like to do full wet prep to check for BV, yeast and STD's. Self swab obtained. Explained if + she will be contacted. Also advised to schedule annual exam at checkout.  [name]

## 2022-05-01 ENCOUNTER — Other Ambulatory Visit: Payer: Self-pay | Admitting: Family Medicine

## 2022-05-01 LAB — CERVICOVAGINAL ANCILLARY ONLY
Bacterial Vaginitis (gardnerella): POSITIVE — AB
Candida Glabrata: NEGATIVE
Candida Vaginitis: POSITIVE — AB
Chlamydia: NEGATIVE
Comment: NEGATIVE
Comment: NEGATIVE
Comment: NEGATIVE
Comment: NEGATIVE
Comment: NEGATIVE
Comment: NORMAL
Neisseria Gonorrhea: NEGATIVE
Trichomonas: NEGATIVE

## 2022-05-01 MED ORDER — FLUCONAZOLE 150 MG PO TABS: 150.0000 mg | ORAL_TABLET | Freq: Once | ORAL | 0 refills | Status: AC

## 2022-05-01 MED ORDER — METRONIDAZOLE 500 MG PO TABS: 500.0000 mg | ORAL_TABLET | Freq: Two times a day (BID) | ORAL | 0 refills | Status: AC

## 2022-05-22 ENCOUNTER — Emergency Department (HOSPITAL_COMMUNITY)
Admission: EM | Admit: 2022-05-22 | Discharge: 2022-05-22 | Disposition: A | Discharge status: Home / Self Care | Payer: Medicaid Other | Attending: Emergency Medicine | Admitting: Emergency Medicine

## 2022-05-22 ENCOUNTER — Encounter (HOSPITAL_COMMUNITY): Payer: Self-pay

## 2022-05-22 DIAGNOSIS — L02214 Cutaneous abscess of groin: Secondary | ICD-10-CM | POA: Diagnosis not present

## 2022-05-22 DIAGNOSIS — I10 Essential (primary) hypertension: Secondary | ICD-10-CM | POA: Insufficient documentation

## 2022-05-22 DIAGNOSIS — L02415 Cutaneous abscess of right lower limb: Secondary | ICD-10-CM | POA: Diagnosis not present

## 2022-05-22 DIAGNOSIS — Z79899 Other long term (current) drug therapy: Secondary | ICD-10-CM | POA: Diagnosis not present

## 2022-05-22 DIAGNOSIS — Z87891 Personal history of nicotine dependence: Secondary | ICD-10-CM | POA: Insufficient documentation

## 2022-05-22 DIAGNOSIS — M79651 Pain in right thigh: Secondary | ICD-10-CM | POA: Diagnosis present

## 2022-05-22 DIAGNOSIS — L0291 Cutaneous abscess, unspecified: Secondary | ICD-10-CM

## 2022-05-22 MED ORDER — LIDOCAINE-EPINEPHRINE (PF) 2 %-1:200000 IJ SOLN
10.0000 mL | Freq: Once | INTRAMUSCULAR | Status: AC
  Administered 2022-05-22: 10 mL via INTRADERMAL
  Filled 2022-05-22: qty 20

## 2022-05-22 NOTE — ED Provider Notes (Signed)
[facility] Provider Note  CSN: [id number] Arrival date & time: 05/22/22 0119  Chief Complaint(s) Abscess  HPI Julie Hendrix is a 37 y.o. female  who presents to the emergency department with 2 days of right upper thigh pain believed to be related to an abscess.  Pain has gradually worsened since onset and is now severe.  Worse with palpation and ambulation.  She reports prior history of abscesses in the past.  She denies any history of IV drug use, diabetes though she has got a diagnosis of prediabetes in her chart.  Denies any physical complaints.  The history is provided by the patient.    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 Started 12/2018, trying to quit.    Patient Active Problem List   Diagnosis Date Noted   Chest pain of uncertain etiology 37/90/2409   Chest discomfort 10/31/2020   Back pain 09/06/2019   Gastroesophageal reflux disease 06/06/2019   Prediabetes 06/17/2018   Essential hypertension 05/18/2018   Morbid obesity with body mass index (BMI) of 45.0 to 49.9 in adult [facility]) 11/30/2017   PCOS (polycystic ovarian syndrome) 11/30/2017   Low grade squamous intraepithelial lesion on cytologic smear of cervix (LGSIL) 05/21/2017   Breast lump on right side at 10 o'clock position 01/31/2013   Home Medication(s) Prior to Admission medications   Medication Sig Start Date End Date Taking? Authorizing Provider  amLODipine (NORVASC) 10 MG tablet TAKE 1 TABLET(10 MG) BY MOUTH DAILY 12/19/21   Eppie Gibson, MD  dextromethorphan-guaiFENesin [facility] DM) 30-600 MG 12hr tablet Take 1 tablet by mouth 2 (two) times daily. 01/13/22   Nyoka Lint,  PA-C  fluticasone [facility]) 50 MCG/ACT nasal spray Place 2 sprays into both nostrils daily. 01/13/22   Nyoka Lint, PA-C  hydrochlorothiazide (HYDRODIURIL) 12.5 MG tablet TAKE 1 TABLET(12.5 MG) BY MOUTH DAILY 09/22/21   Eppie Gibson, MD  megestrol (MEGACE) 40 MG tablet Take 40 mg by mouth daily as needed (patient reports).    [provider]  metroNIDAZOLE (METROGEL) 0.75 % vaginal gel Place 1 Applicatorful vaginally 2 (two) times daily as needed (vaginal irritation).    [provider]  prednisoLONE acetate (PRED FORTE) 1 % ophthalmic suspension As instructed 04/27/22   [provider]  Semaglutide,0.25 or 0.5MG /DOS, 2 MG/1.5ML SOPN Inject 0.25 mg into the skin once a week. 0.25 mg once weekly for 4 weeks then increase to 0.5 mg weekly for at least 4 weeks,max 1 mg Patient not taking: Reported on 04/30/2022 01/05/22   Eppie Gibson, MD  Allergies Patient has no known allergies.  Review of Systems Review of Systems As noted in HPI  Physical Exam Vital Signs  I have reviewed the triage vital signs BP (!) 173/121 (BP Location: Left Arm)   Pulse (!) 102   Temp 98.2 F (36.8 C) (Oral)   Resp 16   Ht 5\' 3"  (1.6 m)   Wt 117.5 kg   SpO2 95%   BMI 45.88 kg/m   Physical Exam Vitals reviewed.  Constitutional:      General: She is not in acute distress.    Appearance: She is well-developed. She is obese. She is not diaphoretic.  HENT:     Head: Normocephalic and atraumatic.     Right Ear: External ear normal.     Left Ear: External ear normal.     Nose: Nose normal.  Eyes:     General: No scleral icterus.    Conjunctiva/sclera: Conjunctivae normal.  Neck:     Trachea: Phonation normal.  Cardiovascular:     Rate and Rhythm: Normal rate and regular rhythm.  Pulmonary:     Effort: Pulmonary effort is normal. No respiratory  distress.     Breath sounds: No stridor.  Abdominal:     General: There is no distension.  Genitourinary:   Musculoskeletal:        General: Normal range of motion.     Cervical back: Normal range of motion.  Neurological:     Mental Status: She is alert and oriented to person, place, and time.  Psychiatric:        Behavior: Behavior normal.     ED Results and Treatments Labs (all labs ordered are listed, but only abnormal results are displayed) Labs Reviewed - No data to display                                                                                                                       EKG  EKG Interpretation  Date/Time:    Ventricular Rate:    PR Interval:    QRS Duration:   QT Interval:    QTC Calculation:   R Axis:     Text Interpretation:         Radiology No results found.  Medications Ordered in ED Medications  lidocaine-EPINEPHrine (XYLOCAINE W/EPI) 2 %-1:200000 (PF) injection 10 mL (10 mLs Intradermal Given 05/22/22 0245)  Procedures .Marland KitchenIncision and Drainage  Date/Time: 05/22/2022 3:51 AM  Performed by: Fatima Blank, MD Authorized by: Fatima Blank, MD   Consent:    Consent obtained:  Verbal   Consent given by:  Patient   Risks discussed:  Bleeding, damage to other organs, incomplete drainage and infection   Alternatives discussed:  Alternative treatment Universal protocol:    Patient identity confirmed:  Arm band and verbally with patient Location:    Type:  Abscess   Size:  2   Location:  Lower extremity   Lower extremity location:  Leg   Leg location:  R upper leg Pre-procedure details:    Skin preparation:  Chlorhexidine Anesthesia:    Anesthesia method:  Local infiltration   Local anesthetic:  Lidocaine 2% WITH epi Procedure type:    Complexity:  Complex Procedure details:     Incision types:  Cruciate   Incision depth:  Subcutaneous   Wound management:  Probed and deloculated and extensive cleaning   Drainage:  Purulent and bloody   Drainage amount:  Moderate   Wound treatment:  Wound left open   Packing materials:  None Post-procedure details:    Procedure completion:  Tolerated   (including critical care time)  Medical Decision Making / ED Course   Medical Decision Making Risk Prescription drug management.    Right upper thigh abscess without overlying cellulitis I&D as above No antibiotics necessary at this time Warm soaks/sitz bath's recommended.      Final Clinical Impression(s) / ED Diagnoses Final diagnoses:  Abscess   The patient appears reasonably screened and/or stabilized for discharge and I doubt any other medical condition or other [facility] requiring further screening, evaluation, or treatment in the ED at this time. I have discussed the findings, Dx and Tx plan with the patient/family who expressed understanding and agree(s) with the plan. Discharge instructions discussed at length. The patient/family was given strict return precautions who verbalized understanding of the instructions. No further questions at time of discharge.  Disposition: Discharge  Condition: Good  ED Discharge Orders     None      Follow Up: Primary care provider  Call  to schedule an appointment for close follow up           This chart was dictated using voice recognition software.  Despite best efforts to proofread,  errors can occur which can change the documentation meaning.    Fatima Blank, MD 05/22/22 [id number]

## 2022-05-22 NOTE — ED Triage Notes (Signed)
Patient arrived with a possible abscess on her right groin over the last few days. Declines any drainage.

## 2022-06-03 ENCOUNTER — Encounter: Payer: Self-pay | Admitting: Student

## 2022-06-03 ENCOUNTER — Ambulatory Visit: Payer: Medicaid Other | Admitting: Student

## 2022-06-03 VITALS — BP 120/76 | HR 78 | Temp 98.0°F | Ht 63.0 in | Wt 262.6 lb

## 2022-06-03 DIAGNOSIS — U071 COVID-19: Secondary | ICD-10-CM | POA: Diagnosis not present

## 2022-06-03 DIAGNOSIS — R051 Acute cough: Secondary | ICD-10-CM | POA: Diagnosis not present

## 2022-06-03 LAB — POC SOFIA 2 FLU + SARS ANTIGEN FIA
Influenza A, POC: NEGATIVE
Influenza B, POC: NEGATIVE
SARS Coronavirus 2 Ag: POSITIVE — AB

## 2022-06-03 MED ORDER — NIRMATRELVIR/RITONAVIR (PAXLOVID)TABLET: 3.0000 | ORAL_TABLET | Freq: Two times a day (BID) | ORAL | 0 refills | Status: AC

## 2022-06-03 NOTE — Patient Instructions (Addendum)
It was great to see you today! Thank you for choosing [facility] for your primary care. Zaliah [employer] was seen for sick visit.  Today we addressed: -Take 3 tablets by mouth 2 (two) times daily for 5 days of Paxlovid  -Please see your work note for guidelines  -Call if you experience shortness of breath or chest pain    If you haven't already, sign up for My Chart to have easy access to your labs results, and communication with your primary care physician.  I recommend that you always bring your medications to each appointment as this makes it easy to ensure you are on the correct medications and helps [place] not miss refills when you need them. Call the clinic at [phone] if your symptoms worsen or you have any concerns.  You should return to our clinic Return if symptoms worsen or fail to improve. Please arrive 15 minutes before your appointment to ensure smooth check in process.  We appreciate your efforts in making this happen.  Thank you for allowing me to participate in your care, Erskine Emery, MD 06/03/2022, 11:58 AM PGY-2, [place]

## 2022-06-03 NOTE — Progress Notes (Unsigned)
  SUBJECTIVE:   CHIEF COMPLAINT / HPI:   Sick Visit:  Feeling sick since Monday. 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 Started 12/2018, trying to quit.     OBJECTIVE:  BP 120/76   Pulse 78   Temp 98 F (36.7 C)   Ht 5\' 3"  (1.6 m)   Wt 262 lb 9.6 oz (119.1 kg)   SpO2 98%   BMI 46.52 kg/m  Physical Exam  General: Alert and oriented in no apparent distress Heart: Regular rate and rhythm with no murmurs appreciated Lungs: CTA bilaterally, no wheezing Abdomen: Bowel sounds present, no abdominal pain Skin: Warm and dry Extremities: No lower extremity edema   ASSESSMENT/PLAN:  Acute cough -     POC SOFIA 2 FLU + SARS ANTIGEN FIA   No follow-ups on file. Erskine Emery, MD 06/03/2022, 11:39 AM PGY-2, [place] {    This will disappear when note is signed, click to select method of visit    :1}

## 2022-06-04 ENCOUNTER — Other Ambulatory Visit (HOSPITAL_COMMUNITY): Payer: Self-pay

## 2022-06-04 ENCOUNTER — Other Ambulatory Visit: Payer: Self-pay | Admitting: Student

## 2022-06-04 DIAGNOSIS — U071 COVID-19: Secondary | ICD-10-CM | POA: Insufficient documentation

## 2022-06-04 MED ORDER — METRONIDAZOLE 0.75 % VA GEL
1.0000 | Freq: Two times a day (BID) | VAGINAL | 0 refills | Status: DC | PRN
  Filled 2022-06-04: qty 70, 5d supply, fill #0

## 2022-06-04 NOTE — Assessment & Plan Note (Signed)
COVID positive. Unlikely with associated PNA with no focal diminishment on exam or systemic symptoms. No history of COPD or asthma.  Honey can provide symptomatic relief, can also try humidifier at home, Tylenol/Ibuprofen as needed. Provided Paxlovid, within 5 days of symptoms. Work note given following CDC guidelines. If symptoms remain in the next couple of weeks or worsen, patient was instructed to return.

## 2022-06-05 ENCOUNTER — Other Ambulatory Visit (HOSPITAL_COMMUNITY): Payer: Self-pay

## 2022-06-24 ENCOUNTER — Encounter: Payer: Self-pay | Admitting: Certified Nurse Midwife

## 2022-06-24 ENCOUNTER — Other Ambulatory Visit: Payer: Self-pay

## 2022-06-24 ENCOUNTER — Ambulatory Visit (INDEPENDENT_AMBULATORY_CARE_PROVIDER_SITE_OTHER): Payer: Medicaid Other | Admitting: Certified Nurse Midwife

## 2022-06-24 ENCOUNTER — Other Ambulatory Visit (HOSPITAL_COMMUNITY)
Admission: RE | Admit: 2022-06-24 | Discharge: 2022-06-24 | Disposition: A | Discharge status: Home / Self Care | Payer: Medicaid Other | Source: Ambulatory Visit | Attending: Certified Nurse Midwife | Admitting: Certified Nurse Midwife

## 2022-06-24 VITALS — BP 123/84 | HR 77 | Ht 63.0 in | Wt 264.7 lb

## 2022-06-24 DIAGNOSIS — Z113 Encounter for screening for infections with a predominantly sexual mode of transmission: Secondary | ICD-10-CM

## 2022-06-24 DIAGNOSIS — N760 Acute vaginitis: Secondary | ICD-10-CM | POA: Diagnosis not present

## 2022-06-24 DIAGNOSIS — Z124 Encounter for screening for malignant neoplasm of cervix: Secondary | ICD-10-CM | POA: Insufficient documentation

## 2022-06-24 DIAGNOSIS — B9689 Other specified bacterial agents as the cause of diseases classified elsewhere: Secondary | ICD-10-CM | POA: Diagnosis not present

## 2022-06-24 DIAGNOSIS — Z8742 Personal history of other diseases of the female genital tract: Secondary | ICD-10-CM

## 2022-06-24 DIAGNOSIS — Z01419 Encounter for gynecological examination (general) (routine) without abnormal findings: Secondary | ICD-10-CM

## 2022-06-24 NOTE — Progress Notes (Signed)
Pt reports some vaginal itching.

## 2022-06-25 LAB — CERVICOVAGINAL ANCILLARY ONLY
Bacterial Vaginitis (gardnerella): POSITIVE — AB
Candida Glabrata: NEGATIVE
Candida Vaginitis: NEGATIVE
Chlamydia: NEGATIVE
Comment: NEGATIVE
Comment: NEGATIVE
Comment: NEGATIVE
Comment: NEGATIVE
Comment: NEGATIVE
Comment: NORMAL
Neisseria Gonorrhea: NEGATIVE
Trichomonas: NEGATIVE

## 2022-06-25 LAB — HIV ANTIBODY (ROUTINE TESTING W REFLEX): HIV Screen 4th Generation wRfx: NONREACTIVE

## 2022-06-25 LAB — RPR: RPR Ser Ql: NONREACTIVE

## 2022-06-25 LAB — HEPATITIS C ANTIBODY: Hep C Virus Ab: NONREACTIVE

## 2022-06-25 LAB — HEPATITIS B SURFACE ANTIGEN: Hepatitis B Surface Ag: NEGATIVE

## 2022-06-25 NOTE — Progress Notes (Incomplete)
ANNUAL EXAM Patient name: Julie Hendrix MRN [id number]  Date of birth: Jan 30, 1986 Chief Complaint:   Gynecologic Exam  History of Present Illness:   Julie Hendrix is a 37 y.o. [phone] African-American female being seen today for a routine annual exam.  Current complaints: Having some vaginal itching but no other complaints, sees her PCP for all non-gyn related health concerns  Patient's last menstrual period was 06/23/2022 (approximate).  Last pap 06/20/19. Results were: NILM w/ HRHPV negative. H/O abnormal pap: no Last mammogram: Never (age). Results were: normal. Family h/o breast cancer: no Last colonoscopy: Never (age). Results were: N/A. Family h/o colorectal cancer: no     06/24/2022    4:37 PM 06/03/2022   11:30 AM 08/20/2021   11:11 AM 08/19/2021   10:34 AM 03/12/2021    5:00 PM  Depression screen PHQ 2/9  Decreased Interest 0 1 0 0 0  Down, Depressed, Hopeless 0 1 0 1 0  PHQ - 2 Score 0 2 0 1 0  Altered sleeping 0 0 0 0 0  Tired, decreased energy 0 1 0 1 0  Change in appetite 1 0 0 3 0  Feeling bad or failure about yourself  0 0 0 0 0  Trouble concentrating 0 0 0 0 1  Moving slowly or fidgety/restless 0 0 0 0 0  Suicidal thoughts 0 0 0 0 0  PHQ-9 Score 1 3 0 5 1  Difficult doing work/chores   Not difficult at all          06/24/2022    4:37 PM 08/19/2021   10:37 AM 03/12/2021    5:00 PM 02/19/2021    3:00 PM  GAD 7 : Generalized Anxiety Score  Nervous, Anxious, on Edge 0 0 0 1  Control/stop worrying 0 0 0 0  Worry too much - different things 0 0 0 1  Trouble relaxing 1 1 0 2  Restless 0 0 0 0  Easily annoyed or irritable 0 0 0 2  Afraid - awful might happen 0 0 0 0  Total GAD 7 Score 1 1 0 6     Review of Systems:   Pertinent items are noted in HPI Denies any headaches, blurred vision, fatigue, shortness of breath, chest pain, abdominal pain, abnormal vaginal discharge/itching/odor/irritation, problems with periods, bowel movements,  urination, or intercourse unless otherwise stated above. Pertinent History Reviewed:  Reviewed past medical,surgical, social and family history.  Reviewed problem list, medications and allergies. Physical Assessment:   Vitals:   06/24/22 1606  BP: 123/84  Pulse: 77  Weight: 264 lb 11.2 oz (120.1 kg)  Height: '5\' 3"'$  (1.6 m)   Body mass index is 46.89 kg/m.   Physical Examination:  General appearance - well appearing, and in no distress Mental status - alert, oriented to person, place, and time Psych:  She has a normal mood and affect Skin - warm and dry, normal color, no suspicious lesions noted Chest - effort normal, all lung fields clear to auscultation bilaterally Heart - normal rate and regular rhythm Neck:  midline trachea, no thyromegaly or nodules Breasts - breasts appear normal, no suspicious masses, no skin or nipple changes or  axillary nodes Abdomen - soft, nontender, nondistended, no masses or organomegaly Pelvic - VULVA: normal appearing vulva with no masses, tenderness or lesions  VAGINA: normal appearing vagina with normal color and discharge, no lesions  CERVIX: normal appearing cervix without discharge or lesions, no CMT Thin prep pap  is done with HR HPV cotesting Extremities:  No swelling or varicosities noted  Chaperone present for exam  No results found for this or any previous visit (from the past 24 hour(s)).  Assessment & Plan:      1. Encounter for annual routine gynecological examination *** - Cytology - PAP( [place]) - Cervicovaginal ancillary only( [place])  2. Cervical cancer screening *** - Cytology - PAP( [place])  3. Screening for STD (sexually transmitted disease) *** - Cervicovaginal ancillary only( [place]) - HIV antibody (with reflex) - RPR - Hepatitis B Surface AntiGEN - Hepatitis C Antibody  Will follow up results of pap smear and manage accordingly. Mammogram scheduled Colon cancer screening is up to  date***Referral made to Gastroenterology for colonoscopy***discussed Cologuard vs Colonoscopy details and patient will decide and let us know her decision. Routine preventative health maintenance measures emphasized. Please refer to After Visit Summary for other counseling recommendations.       Mammogram: {Mammo f/u:25212::"@ 40yo"}, or sooner if problems Colonoscopy: {TCS f/u:25213::"@ 39yo"}, or sooner if problems  Orders Placed This Encounter  Procedures  . HIV antibody (with reflex)  . RPR  . Hepatitis B Surface AntiGEN  . Hepatitis C Antibody    Meds: No orders of the defined types were placed in this encounter.   Follow-up: No follow-ups on file.  Gabriel Carina, CNM 06/25/2022 11:28 PM

## 2022-06-25 NOTE — Progress Notes (Signed)
ANNUAL EXAM Patient name: Julie Hendrix MRN [id number]  Date of birth: [birth year] Chief Complaint:   Gynecologic Exam  History of Present Illness:   Julie Hendrix is a 37 y.o. [phone] African-American female being seen today for a routine annual exam.  Current complaints: Having some vaginal itching but no other complaints, was on megace for heavy vaginal bleeding and has not had a cycle since she stopped taking it (two months ago) but started having light bleeding 3 days ago. Sees her PCP for all non-gyn related health concerns  Patient's last menstrual period was 06/23/2022 (approximate).  Last pap 06/20/19. Results were: NILM w/ HRHPV negative. H/O abnormal pap: no Last mammogram: Never (age). Results were: normal. Family h/o breast cancer: no Last colonoscopy: Never (age). Results were: N/A. Family h/o colorectal cancer: no     06/24/2022    4:37 PM 06/03/2022   11:30 AM 08/20/2021   11:11 AM 08/19/2021   10:34 AM 03/12/2021    5:00 PM  Depression screen PHQ 2/9  Decreased Interest 0 1 0 0 0  Down, Depressed, Hopeless 0 1 0 1 0  PHQ - 2 Score 0 2 0 1 0  Altered sleeping 0 0 0 0 0  Tired, decreased energy 0 1 0 1 0  Change in appetite 1 0 0 3 0  Feeling bad or failure about yourself  0 0 0 0 0  Trouble concentrating 0 0 0 0 1  Moving slowly or fidgety/restless 0 0 0 0 0  Suicidal thoughts 0 0 0 0 0  PHQ-9 Score 1 3 0 5 1  Difficult doing work/chores   Not difficult at all          06/24/2022    4:37 PM 08/19/2021   10:37 AM 03/12/2021    5:00 PM 02/19/2021    3:00 PM  GAD 7 : Generalized Anxiety Score  Nervous, Anxious, on Edge 0 0 0 1  Control/stop worrying 0 0 0 0  Worry too much - different things 0 0 0 1  Trouble relaxing 1 1 0 2  Restless 0 0 0 0  Easily annoyed or irritable 0 0 0 2  Afraid - awful might happen 0 0 0 0  Total GAD 7 Score 1 1 0 6     Review of Systems:   Pertinent items are noted in HPI Denies any headaches, blurred vision,  fatigue, shortness of breath, chest pain, abdominal pain, abnormal vaginal discharge/itching/odor/irritation, problems with periods, bowel movements, urination, or intercourse unless otherwise stated above. Pertinent History Reviewed:  Reviewed past medical,surgical, social and family history.  Reviewed problem list, medications and allergies. Physical Assessment:   Vitals:   06/24/22 1606  BP: 123/84  Pulse: 77  Weight: 264 lb 11.2 oz (120.1 kg)  Height: 5\' 3"  (1.6 m)   Body mass index is 46.89 kg/m.   Physical Examination:  General appearance - well appearing, and in no distress Mental status - alert, oriented to person, place, and time Psych:  She has a normal mood and affect Skin - warm and dry, normal color, no suspicious lesions noted Chest - effort normal, no problems with respiration noted Heart - normal rate and regular rhythm Neck:  midline trachea, no thyromegaly or nodules Breasts - breasts appear normal Abdomen - soft, nontender, nondistended Pelvic - VULVA: normal appearing vulva with no masses, tenderness or lesions  VAGINA: normal appearing vagina with normal color and discharge, no lesions  CERVIX: normal  appearing cervix without discharge or lesions, no CMT Thin prep pap is done with HR HPV cotesting Extremities:  No swelling or varicosities noted  Chaperone present for exam  No results found for this or any previous visit (from the past 24 hour(s)).  Assessment & Plan:  1. Encounter for annual routine gynecological examination - Routine preventative health maintenance measures emphasized.  2. Cervical cancer screening - Cytology - PAP( [facility]) - Will follow results of pap and manage accordingly  3. Screening for STD (sexually transmitted disease) - Cervicovaginal ancillary only( [facility]) - HIV antibody (with reflex) - RPR - Hepatitis B Surface AntiGEN - Hepatitis C Antibody  4. History of menorrhagia - Advised to watch her menstrual  bleeding, can take megace again if it becomes too heavy and follow up if so  Mammogram: @ 40yo, or sooner if problems Colonoscopy: @ 39yo, or sooner if problems  Orders Placed This Encounter  Procedures   HIV antibody (with reflex)   RPR   Hepatitis B Surface AntiGEN   Hepatitis C Antibody   Meds: No orders of the defined types were placed in this encounter.  Follow-up: Return in about 1 year (around 06/24/2023) for ANN.  Gabriel Carina, CNM 06/26/2022 12:53 AM

## 2022-06-26 LAB — CYTOLOGY - PAP
Adequacy: ABSENT
Comment: NEGATIVE
Diagnosis: NEGATIVE
High risk HPV: NEGATIVE

## 2022-06-26 MED ORDER — METRONIDAZOLE 500 MG PO TABS: 500.0000 mg | ORAL_TABLET | Freq: Two times a day (BID) | ORAL | 0 refills | Status: DC

## 2022-08-11 ENCOUNTER — Ambulatory Visit: Payer: Medicaid Other | Admitting: Family Medicine

## 2022-08-11 ENCOUNTER — Encounter: Payer: Self-pay | Admitting: Family Medicine

## 2022-08-11 VITALS — BP 138/90 | HR 97 | Ht 63.0 in | Wt 258.2 lb

## 2022-08-11 DIAGNOSIS — N939 Abnormal uterine and vaginal bleeding, unspecified: Secondary | ICD-10-CM | POA: Insufficient documentation

## 2022-08-11 DIAGNOSIS — I10 Essential (primary) hypertension: Secondary | ICD-10-CM

## 2022-08-11 DIAGNOSIS — Z6841 Body Mass Index (BMI) 40.0 and over, adult: Secondary | ICD-10-CM

## 2022-08-11 DIAGNOSIS — G5623 Lesion of ulnar nerve, bilateral upper limbs: Secondary | ICD-10-CM

## 2022-08-11 DIAGNOSIS — N920 Excessive and frequent menstruation with regular cycle: Secondary | ICD-10-CM | POA: Insufficient documentation

## 2022-08-11 DIAGNOSIS — K219 Gastro-esophageal reflux disease without esophagitis: Secondary | ICD-10-CM

## 2022-08-11 DIAGNOSIS — K591 Functional diarrhea: Secondary | ICD-10-CM | POA: Diagnosis not present

## 2022-08-11 DIAGNOSIS — N921 Excessive and frequent menstruation with irregular cycle: Secondary | ICD-10-CM | POA: Diagnosis not present

## 2022-08-11 DIAGNOSIS — R072 Precordial pain: Secondary | ICD-10-CM | POA: Diagnosis not present

## 2022-08-11 DIAGNOSIS — E66813 Obesity, class 3: Secondary | ICD-10-CM

## 2022-08-11 MED ORDER — OMEPRAZOLE 20 MG PO CPDR: 20.0000 mg | DELAYED_RELEASE_CAPSULE | Freq: Two times a day (BID) | ORAL | 2 refills | Status: DC

## 2022-08-11 MED ORDER — MEGESTROL ACETATE 40 MG PO TABS: ORAL_TABLET | ORAL | 0 refills | Status: DC

## 2022-08-11 MED ORDER — METAMUCIL SMOOTH TEXTURE 58.6 % PO POWD: 1.0000 | Freq: Three times a day (TID) | ORAL | 12 refills | Status: DC

## 2022-08-11 NOTE — Patient Instructions (Signed)
It was wonderful to see you today.  Please bring ALL of your medications with you to every visit.   Today we talked about:  -Restarting omeprazole for reflux (GERD). Attached is some reading on reflux/GERD and high fiber diets. -Will send in fiber supplements (metamucil) to help regulate bowel movements -We referred you to "healthy weight and wellness" clinic who will call you to schedule an appointment -If your chest pain becomes persistent, worse, or happens at the same time as shortness of breath, nausea, or sudden sweatiness please go to the emergency department.  Thank you for coming to your visit as scheduled. We have had a large "no-show" problem lately, and this significantly limits our ability to see and care for patients. As a friendly reminder- if you cannot make your appointment please call to cancel. We do have a no show policy for those who do not cancel within 24 hours. Our policy is that if you miss or fail to cancel an appointment within 24 hours, 3 times in a 18-month period, you may be dismissed from our clinic.   Thank you for choosing [facility].   Please call [phone] with any questions about today's appointment.  Please be sure to schedule follow up at the front  desk before you leave today.   Sabino Dick, DO PGY-3 Family Medicine   High-Fiber Eating Plan Fiber, also called dietary fiber, is a type of carbohydrate. It is found foods such as fruits, vegetables, whole grains, and beans. A high-fiber diet can have many health benefits. Your health care provider may recommend a high-fiber diet to help: Prevent constipation. Fiber can make your bowel movements more regular. Lower your cholesterol. Relieve the following conditions: Inflammation of veins in the anus (hemorrhoids). Inflammation of specific areas of the digestive tract (uncomplicated diverticulosis). A problem of the large intestine, also called the colon, that sometimes causes  pain and diarrhea (irritable bowel syndrome, or IBS). Prevent overeating as part of a weight-loss plan. Prevent heart disease, type 2 diabetes, and certain cancers. What are tips for following this plan? Reading food labels  Check the nutrition facts label on food products for the amount of dietary fiber. Choose foods that have 5 grams of fiber or more per serving. The goals for recommended daily fiber intake include: Men (age 60 or younger): 34-38 g. Men (over age 28): 28-34 g. Women (age 71 or younger): 25-28 g. Women (over age 72): 22-25 g. Your daily fiber goal is _____________ g. Shopping Choose whole fruits and vegetables instead of processed forms, such as apple juice or applesauce. Choose a wide variety of high-fiber foods such as avocados, lentils, oats, and kidney beans. Read the nutrition facts label of the foods you choose. Be aware of foods with added fiber. These foods often have high sugar and sodium amounts per serving. Cooking Use whole-grain flour for baking and cooking. Cook with brown rice instead of white rice. Meal planning Start the day with a breakfast that is high in fiber, such as a cereal that contains 5 g of fiber or more per serving. Eat breads and cereals that are made with whole-grain flour instead of refined flour or white flour. Eat brown rice, bulgur wheat, or millet instead of white rice. Use beans in place of meat in soups, salads, and pasta dishes. Be sure that half of the grains you eat each day are whole grains. General information You can get the recommended daily intake of dietary fiber by: Eating a variety  of fruits, vegetables, grains, nuts, and beans. Taking a fiber supplement if you are not able to take in enough fiber in your diet. It is better to get fiber through food than from a supplement. Gradually increase how much fiber you consume. If you increase your intake of dietary fiber too quickly, you may have bloating, cramping, or  gas. Drink plenty of water to help you digest fiber. Choose high-fiber snacks, such as berries, raw vegetables, nuts, and popcorn. What foods should I eat? Fruits Berries. Pears. Apples. Oranges. Avocado. Prunes and raisins. Dried figs. Vegetables Sweet potatoes. Spinach. Kale. Artichokes. Cabbage. Broccoli. Cauliflower. Green peas. Carrots. Squash. Grains Whole-grain breads. Multigrain cereal. Oats and oatmeal. Brown rice. Barley. Bulgur wheat. Millet. Quinoa. Bran muffins. Popcorn. Rye wafer crackers. Meats and other proteins Navy beans, kidney beans, and pinto beans. Soybeans. Split peas. Lentils. Nuts and seeds. Dairy Fiber-fortified yogurt. Beverages Fiber-fortified soy milk. Fiber-fortified orange juice. Other foods Fiber bars. The items listed above may not be a complete list of recommended foods and beverages. Contact a dietitian for more information. What foods should I avoid? Fruits Fruit juice. Cooked, strained fruit. Vegetables Fried potatoes. Canned vegetables. Well-cooked vegetables. Grains White bread. Pasta made with refined flour. White rice. Meats and other proteins Fatty cuts of meat. Fried chicken or fried fish. Dairy Milk. Yogurt. Cream cheese. Sour cream. Fats and oils Butters. Beverages Soft drinks. Other foods Cakes and pastries. The items listed above may not be a complete list of foods and beverages to avoid. Talk with your dietitian about what choices are best for you. Summary Fiber is a type of carbohydrate. It is found in foods such as fruits, vegetables, whole grains, and beans. A high-fiber diet has many benefits. It can help to prevent constipation, lower blood cholesterol, aid weight loss, and reduce your risk of heart disease, diabetes, and certain cancers. Increase your intake of fiber gradually. Increasing fiber too quickly may cause cramping, bloating, and gas. Drink plenty of water while you increase the amount of fiber you consume. The  best sources of fiber include whole fruits and vegetables, whole grains, nuts, seeds, and beans. This information is not intended to replace advice given to you by your health care provider. Make sure you discuss any questions you have with your health care provider. Document Revised: 08/10/2019 Document Reviewed: 08/10/2019 Elsevier Patient Education  2023 Else[employer]  Gastroesophageal Reflux Disease, Adult Gastroesophageal reflux (GER) happens when acid from the stomach flows up into the tube that connects the mouth and the stomach (esophagus). Normally, food travels down the esophagus and stays in the stomach to be digested. However, when a person has GER, food and stomach acid sometimes move back up into the esophagus. If this becomes a more serious problem, the person may be diagnosed with a disease called gastroesophageal reflux disease (GERD). GERD occurs when the reflux: Happens often. Causes frequent or severe symptoms. Causes problems such as damage to the esophagus. When stomach acid comes in contact with the esophagus, the acid may cause inflammation in the esophagus. Over time, GERD may create small holes (ulcers) in the lining of the esophagus. What are the causes? This condition is caused by a problem with the muscle between the esophagus and the stomach (lower esophageal sphincter, or LES). Normally, the LES muscle closes after food passes through the esophagus to the stomach. When the LES is weakened or abnormal, it does not close properly, and that allows food and stomach acid to go back up into  the esophagus. The LES can be weakened by certain dietary substances, medicines, and medical conditions, including: Tobacco use. Pregnancy. Having a hiatal hernia. Alcohol use. Certain foods and beverages, such as coffee, chocolate, onions, and peppermint. What increases the risk? You are more likely to develop this condition if you: Have an increased body weight. Have a connective  tissue disorder. Take NSAIDs, such as ibuprofen. What are the signs or symptoms? Symptoms of this condition include: Heartburn. Difficult or painful swallowing and the feeling of having a lump in the throat. A bitter taste in the mouth. Bad breath and having a large amount of saliva. Having an upset or bloated stomach and belching. Chest pain. Different conditions can cause chest pain. Make sure you see your health care provider if you experience chest pain. Shortness of breath or wheezing. Ongoing (chronic) cough or a nighttime cough. Wearing away of tooth enamel. Weight loss. How is this diagnosed? This condition may be diagnosed based on a medical history and a physical exam. To determine if you have mild or severe GERD, your health care provider may also monitor how you respond to treatment. You may also have tests, including: A test to examine your stomach and esophagus with a small camera (endoscopy). A test that measures the acidity level in your esophagus. A test that measures how much pressure is on your esophagus. A barium swallow or modified barium swallow test to show the shape, size, and functioning of your esophagus. How is this treated? Treatment for this condition may vary depending on how severe your symptoms are. Your health care provider may recommend: Changes to your diet. Medicine. Surgery. The goal of treatment is to help relieve your symptoms and to prevent complications. Follow these instructions at home: Eating and drinking  Follow a diet as recommended by your health care provider. This may involve avoiding foods and drinks such as: Coffee and tea, with or without caffeine. Drinks that contain alcohol. Energy drinks and sports drinks. Carbonated drinks or sodas. Chocolate and cocoa. Peppermint and mint flavorings. Garlic and onions. Horseradish. Spicy and acidic foods, including peppers, chili powder, curry powder, vinegar, hot sauces, and barbecue  sauce. Citrus fruit juices and citrus fruits, such as oranges, lemons, and limes. Tomato-based foods, such as red sauce, chili, salsa, and pizza with red sauce. Fried and fatty foods, such as donuts, french fries, potato chips, and high-fat dressings. High-fat meats, such as hot dogs and fatty cuts of red and white meats, such as rib eye steak, sausage, ham, and bacon. High-fat dairy items, such as whole milk, butter, and cream cheese. Eat small, frequent meals instead of large meals. Avoid drinking large amounts of liquid with your meals. Avoid eating meals during the 2-3 hours before bedtime. Avoid lying down right after you eat. Do not exercise right after you eat. Lifestyle  Do not use any products that contain nicotine or tobacco. These products include cigarettes, chewing tobacco, and vaping devices, such as e-cigarettes. If you need help quitting, ask your health care provider. Try to reduce your stress by using methods such as yoga or meditation. If you need help reducing stress, ask your health care provider. If you are overweight, reduce your weight to an amount that is healthy for you. Ask your health care provider for guidance about a safe weight loss goal. General instructions Pay attention to any changes in your symptoms. Take over-the-counter and prescription medicines only as told by your health care provider. Do not take aspirin, ibuprofen, or other  NSAIDs unless your health care provider told you to take these medicines. Wear loose-fitting clothing. Do not wear anything tight around your waist that causes pressure on your abdomen. Raise (elevate) the head of your bed about 6 inches (15 cm). You can use a wedge to do this. Avoid bending over if this makes your symptoms worse. Keep all follow-up visits. This is important. Contact a health care provider if: You have: New symptoms. Unexplained weight loss. Difficulty swallowing or it hurts to swallow. Wheezing or a  persistent cough. A hoarse voice. Your symptoms do not improve with treatment. Get help right away if: You have sudden pain in your arms, neck, jaw, teeth, or back. You suddenly feel sweaty, dizzy, or light-headed. You have chest pain or shortness of breath. You vomit and the vomit is green, yellow, or black, or it looks like blood or coffee grounds. You faint. You have stool that is red, bloody, or black. You cannot swallow, drink, or eat. These symptoms may represent a serious problem that is an emergency. Do not wait to see if the symptoms will go away. Get medical help right away. Call your local emergency services (911 in the [place] Do not drive yourself to the hospital. Summary Gastroesophageal reflux happens when acid from the stomach flows up into the esophagus. GERD is a disease in which the reflux happens often, causes frequent or severe symptoms, or causes problems such as damage to the esophagus. Treatment for this condition may vary depending on how severe your symptoms are. Your health care provider may recommend diet and lifestyle changes, medicine, or surgery. Contact a health care provider if you have new or worsening symptoms. Take over-the-counter and prescription medicines only as told by your health care provider. Do not take aspirin, ibuprofen, or other NSAIDs unless your health care provider told you to do so. Keep all follow-up visits as told by your health care provider. This is important. This information is not intended to replace advice given to you by your health care provider. Make sure you discuss any questions you have with your health care provider. Document Revised: 10/16/2019 Document Reviewed: 10/16/2019 Elsevier Patient Education  2023 [employer].

## 2022-08-11 NOTE — Assessment & Plan Note (Signed)
On amlodipine 10 mg, HCTZ 12.5 mg.  2 blood pressures today slightly elevated.  Goal less than 140/90. Continue to monitor and adjust if continues to stay above goal.

## 2022-08-11 NOTE — Assessment & Plan Note (Addendum)
DDX includes angina, ACS, GERD, precordial catch syndrome, costochondritis, and muscle strain. Seems atypical for cardiac etiology as no consistent pain in the left chest and no concurrent symptoms that would be expected. No exertional changes. Exam is normal. Her left chest pain is seldom, not concurrent to her epigastric burning and only lasts seconds at a time before "popping" and relieving her of pain. Discussed likely etiology and options for NSAIDs but will defer given her presentation today that is consistent with reflux in context of stopping PPI one month ago.

## 2022-08-11 NOTE — Assessment & Plan Note (Signed)
Requesting assistance with weight loss.  Amenable to follow-up at the healthy weight and wellness. Referral placed.

## 2022-08-11 NOTE — Progress Notes (Signed)
SUBJECTIVE:   CHIEF COMPLAINT / HPI:   Julie Hendrix is a 37 y.o. female who presents to the [facility] clinic today to discuss the following concerns:   Three days of intermittent left chest pain and left posterior shoulder pain. Her chest pain was diffuse to the central chest and left side, but has moved to the center of the chest and epigastric region with time. It comes and goes in episodes lasting a few hours at a time. She describes that on Sunday she ate dinner and felt this pain, so she laid down and woke back up with the central epigastric burning. At that time she did not have shortness of breath, diaphoresis, nausea, or arm pain. She has a history of GERD but has not taken medication for a month as her prescription ran out. She confirms belching and flatulence regularly, but no pyrosis, vocal hoarseness, or globus sensation in the mornings. She notes regular diarrhea after eating meals. 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 She says that she does not eat many vegetables or fiber. The patient denies shortness of breath, diaphoresis, nausea, arm pain concurrent to chest pain.  The patient states that she has occasional, sharp chest pain lasting seconds in the left mid chest that she notices upon inhalation. This pain seems to "pop" and immediately resolve on further inhalation. She has no other symptoms concurrent to these random, seldom episodes.  She also notes numbness in her fingers or arms when laying with medial elbows resting on the bed or when she wakes up after sleeping atop of her arms. Her numbness quickly resolves upon movement or getting out of bed.  PERTINENT  PMH / PSH: HTN, PCOS  OBJECTIVE:   BP (!) 138/90   Pulse 97   Ht  (1.6 m)   Wt 258 lb 4 oz (117.1 kg)   LMP 06/18/2022   SpO2 100%   BMI 45.75 kg/m   Vitals:   08/11/22 1447 08/11/22 1524  BP: (!) 148/84 (!)  138/90  Pulse: 97   SpO2: 100%    General: NAD, pleasant, able to participate in exam Cardiac: RRR, no murmurs. Nontender chest wall. Radial pulses 2+ bilaterally. Respiratory: CTAB, normal effort, No wheezes, rales or rhonchi Abdomen: Obese abdomen, bowel sounds present,mild ttp epigastric and LUQ without R/G, nondistended, no hepatosplenomegaly. Extremities: no edema Skin: warm and dry, no rashes noted Psych: Normal affect and mood  ASSESSMENT/PLAN:   Gastroesophageal reflux disease DDX includes angina, ACS, GERD, precordial catch syndrome, costochondritis, muscle strain. Mainly epigastric pain after meals that sometimes awakens her. Seems atypical for cardiac etiology as no consistent pain in the left chest and no concurrent symptoms that would be expected. No exertional changes. Exam is normal. Will restart omeprazole since she stopped her PPI about one month ago due to running out. Discussed weight loss and dietary changes. Gave return precautions.  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 Although she does not specifically localize affected fingers, it seems to involve the medial digits more. Symptoms are intermittent and positionally dependent when in bed; resolves upon movement and does not persist. Discussed options if this seems to worsen for her in the future.  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 Seems atypical for cardiac etiology as  no consistent pain in the left chest and no concurrent symptoms that would be expected. No exertional changes. Exam is normal. Her left chest pain is seldom, not concurrent to her epigastric burning and only lasts seconds at a time before "popping" and relieving her of pain. Discussed likely etiology and options for NSAIDs but will defer given her presentation  today that is consistent with reflux in context of stopping PPI one month ago.  Heavy menses Has history of PCOS.  Previously prescribed Megace to help control her bleeding.  She has an expired bottle with her and requesting a new medication be sent in. She takes as needed for heavy bleeding.   Morbid obesity with body mass index (BMI) of 45.0 to 49.9 in adult [facility]) Requesting assistance with weight loss.  Amenable to follow-up at the healthy weight and wellness. Referral placed.   Essential hypertension On amlodipine 10 mg, HCTZ 12.5 mg.  2 blood pressures today slightly elevated.  Goal less than 140/90. Continue to monitor and adjust if continues to stay above goal.    Kayleen Memos, Medical Student [facility]   I was personally present and performed or re-performed the history, physical exam and medical decision making activities of this service and have verified that the service and findings are accurately documented in the student's note.  Sabino Dick, DO                  08/11/2022, 4:21 PM

## 2022-08-11 NOTE — Assessment & Plan Note (Addendum)
Differential includes carpal tunnel, guyon canal syndrome, cubital tunnel syndrome, cervical etiology. Although she does not specifically localize affected fingers, it seems to involve the medial digits more. Symptoms are intermittent and positionally dependent when in bed; resolves upon movement and does not persist. Discussed options if this seems to worsen for her in the future.

## 2022-08-11 NOTE — Assessment & Plan Note (Signed)
Has history of PCOS.  Previously prescribed Megace to help control her bleeding.  She has an expired bottle with her and requesting a new medication be sent in. She takes as needed for heavy bleeding.

## 2022-08-11 NOTE — Assessment & Plan Note (Addendum)
DDX includes angina, ACS, GERD, precordial catch syndrome, costochondritis, muscle strain. Mainly epigastric pain after meals that sometimes awakens her. Seems atypical for cardiac etiology as no consistent pain in the left chest and no concurrent symptoms that would be expected. No exertional changes. Exam is normal. Will restart omeprazole since she stopped her PPI about one month ago due to running out. Discussed weight loss and dietary changes. Gave return precautions.

## 2022-09-07 ENCOUNTER — Emergency Department (HOSPITAL_COMMUNITY)
Admission: EM | Admit: 2022-09-07 | Discharge: 2022-09-07 | Disposition: A | Discharge status: Home / Self Care | Payer: Medicaid Other | Attending: Emergency Medicine | Admitting: Emergency Medicine

## 2022-09-07 ENCOUNTER — Encounter (HOSPITAL_COMMUNITY): Payer: Self-pay

## 2022-09-07 ENCOUNTER — Emergency Department (HOSPITAL_COMMUNITY): Payer: Medicaid Other

## 2022-09-07 ENCOUNTER — Other Ambulatory Visit: Payer: Self-pay

## 2022-09-07 DIAGNOSIS — Z79899 Other long term (current) drug therapy: Secondary | ICD-10-CM | POA: Insufficient documentation

## 2022-09-07 DIAGNOSIS — F17200 Nicotine dependence, unspecified, uncomplicated: Secondary | ICD-10-CM | POA: Insufficient documentation

## 2022-09-07 DIAGNOSIS — R1013 Epigastric pain: Secondary | ICD-10-CM | POA: Diagnosis not present

## 2022-09-07 DIAGNOSIS — R0789 Other chest pain: Secondary | ICD-10-CM | POA: Diagnosis not present

## 2022-09-07 DIAGNOSIS — I10 Essential (primary) hypertension: Secondary | ICD-10-CM | POA: Insufficient documentation

## 2022-09-07 DIAGNOSIS — R079 Chest pain, unspecified: Secondary | ICD-10-CM | POA: Diagnosis not present

## 2022-09-07 LAB — I-STAT BETA HCG BLOOD, ED (MC, WL, AP ONLY): I-stat hCG, quantitative: <5 m[IU]/mL (ref <5)

## 2022-09-07 LAB — BASIC METABOLIC PANEL
Anion gap: 10 (ref 5–15)
BUN: 9 mg/dL (ref 6–20)
CO2: 20 mmol/L — ABNORMAL LOW (ref 22–32)
Calcium: 9.1 mg/dL (ref 8.9–10.3)
Chloride: 104 mmol/L (ref 98–111)
Creatinine, Ser: 0.68 mg/dL (ref 0.44–1.00)
GFR, Estimated: >60 mL/min (ref >60)
Glucose, Bld: 95 mg/dL (ref 70–99)
Potassium: 3.7 mmol/L (ref 3.5–5.1)
Sodium: 134 mmol/L — ABNORMAL LOW (ref 135–145)

## 2022-09-07 LAB — TROPONIN I (HIGH SENSITIVITY)
Troponin I (High Sensitivity): <2 ng/L (ref <18)
Troponin I (High Sensitivity): <2 ng/L (ref <18)

## 2022-09-07 LAB — CBC
HCT: 41.8 % (ref 36.0–46.0)
Hemoglobin: 13.5 g/dL (ref 12.0–15.0)
MCH: 28.2 pg (ref 26.0–34.0)
MCHC: 32.3 g/dL (ref 30.0–36.0)
MCV: 87.3 fL (ref 80.0–100.0)
Platelets: 271 10*3/uL (ref 150–400)
RBC: 4.79 MIL/uL (ref 3.87–5.11)
RDW: 13.9 % (ref 11.5–15.5)
WBC: 16.7 10*3/uL — ABNORMAL HIGH (ref 4.0–10.5)
nRBC: 0 % (ref 0.0–0.2)

## 2022-09-07 NOTE — Discharge Instructions (Signed)
Try pepcid or tagamet up to twice a day.  Try to avoid things that may make this worse, most commonly these are spicy foods tomato based products fatty foods chocolate and peppermint.  Alcohol and tobacco can also make this worse.  Return to the emergency department for sudden worsening pain fever or inability to eat or drink.  

## 2022-09-07 NOTE — ED Provider Notes (Signed)
[place] EMERGENCY DEPARTMENT AT [facility] Provider Note   CSN: [id number] Arrival date & time: 09/07/22  1415     History  No chief complaint on file.   Julie Hendrix is a 37 y.o. female.  37 yo F with a chief complaints of epigastric abdominal discomfort and frequent belching.  Worse with bending over at the waist to her certain foods or lying back flat.  She denies any exertional symptoms.  Denies cough congestion or fever.  Denies trauma.  She has been on omeprazole for similar symptoms and she fell against not been improving.  She denies taking her medication every day.  She has continued to eat spicy food.  She is an everyday smoker.  Patient denies history of MI, denies  hyperlipidemia  or diabetes.  She thinks her father had an MI in his 41s.  She is an everyday smoker.  Has a history of hypertension.  Patient denies history of PE or DVT denies hemoptysis denies unilateral lower extremity edema denies recent surgery immobilization hospitalization estrogen use or history of cancer.          Home Medications Prior to Admission medications   Medication Sig Start Date End Date Taking? Authorizing Provider  amLODipine (NORVASC) 10 MG tablet TAKE 1 TABLET(10 MG) BY MOUTH DAILY 12/19/21   Alicia Amel, MD  hydrochlorothiazide (HYDRODIURIL) 12.5 MG tablet TAKE 1 TABLET(12.5 MG) BY MOUTH DAILY 09/22/21   Alicia Amel, MD  megestrol (MEGACE) 40 MG tablet Take 2 tablets by mouth daily. Can increase to 2 tablets twice daily in the event of heavy bleeding. 08/11/22   Sabino Dick, DO  metroNIDAZOLE (METROGEL) 0.75 % vaginal gel Place 1 Applicatorful vaginally 2 (two) times daily as needed (vaginal irritation). 06/04/22   Alicia Amel, MD  omeprazole (PRILOSEC) 20 MG capsule Take 1 capsule (20 mg total) by mouth 2 (two) times daily before a meal. 08/11/22   Espinoza, Alejandra, DO  psyllium (METAMUCIL SMOOTH TEXTURE) 58.6 % powder Take 1 packet by  mouth 3 (three) times daily. 08/11/22   Sabino Dick, DO      Allergies    Patient has no known allergies.    Review of Systems   Review of Systems  Physical Exam Updated Vital Signs BP 133/82   Pulse 85   Temp 98.1 F (36.7 C) (Oral)   Resp 18   Ht 5\' 3"  (1.6 m)   Wt 116.1 kg   LMP 08/24/2022 (Approximate)   SpO2 96%   BMI 45.35 kg/m  Physical Exam Vitals and nursing note reviewed.  Constitutional:      General: She is not in acute distress.    Appearance: She is well-developed. She is not diaphoretic.  HENT:     Head: Normocephalic and atraumatic.  Eyes:     Pupils: Pupils are equal, round, and reactive to light.  Cardiovascular:     Rate and Rhythm: Normal rate and regular rhythm.     Heart sounds: No murmur heard.    No friction rub. No gallop.  Pulmonary:     Effort: Pulmonary effort is normal.     Breath sounds: No wheezing or rales.  Abdominal:     General: There is no distension.     Palpations: Abdomen is soft.     Tenderness: There is no abdominal tenderness.  Musculoskeletal:        General: No tenderness.     Cervical back: Normal range of motion and neck  supple.  Skin:    General: Skin is warm and dry.  Neurological:     Mental Status: She is alert and oriented to person, place, and time.  Psychiatric:        Behavior: Behavior normal.     ED Results / Procedures / Treatments   Labs (all labs ordered are listed, but only abnormal results are displayed) Labs Reviewed  BASIC METABOLIC PANEL - Abnormal; Notable for the following components:      Result Value   Sodium 134 (*)    CO2 20 (*)    All other components within normal limits  CBC - Abnormal; Notable for the following components:   WBC 16.7 (*)    All other components within normal limits  I-STAT BETA HCG BLOOD, ED ([facility], [facility], [facility] ONLY)  TROPONIN I (HIGH SENSITIVITY)  TROPONIN I (HIGH SENSITIVITY)    EKG EKG Interpretation  Date/Time:  Monday Sep 07 2022 14:15:57  EDT Ventricular Rate:  88 PR Interval:  154 QRS Duration: 86 QT Interval:  362 QTC Calculation: 438 R Axis:   97 Text Interpretation: Normal sinus rhythm Rightward axis Early repolarization Borderline ECG No significant change since last tracing Confirmed by Melene Plan [phone]) on 09/07/2022 6:54:03 PM  Radiology DG Chest 2 View  Result Date: 09/07/2022 CLINICAL DATA:  Chest pain off and on for a while. EXAM: CHEST - 2 VIEW COMPARISON:  08/20/2021 FINDINGS: Heart size is UPPER limits normal. Lungs are clear. No pulmonary edema. Visualized osseous structures have a normal appearance. IMPRESSION: No active cardiopulmonary disease. Electronically Signed   By: Norva Pavlov M.D.   On: 09/07/2022 15:48    Procedures Procedures   Discussed smoking cessation with patient and was they were offerred resources to help stop.  Total time was 5 min CPT code 60454.     Medications Ordered in ED Medications - No data to display  ED Course/ Medical Decision Making/ A&P                             Medical Decision Making  37 yo F with a chief complaint of chest pain.  This is atypical in nature.  Going on for weeks.  She feeling was a bit worse at about 9 AM this morning.  Has not significant changed in 6 hours.  She is well-appearing and nontoxic.  Has been able to exert herself without any symptoms.  She has a single troponin here that is negative.  Do not feel she benefit from a delta.  She has a mild leukocytosis of unknown significance.  Chest x-ray independently interpreted by me without focal infiltrate or pneumothorax.  She is PERC negative.  Will discharge the patient home.  Trial of H2 blockers as it does not sound like she has been able to take her PPI daily.  Will have her follow-up with her family doctor in the office.  8:50 PM:  I have discussed the diagnosis/risks/treatment options with the patient.  Evaluation and diagnostic testing in the emergency department does not suggest an  emergent condition requiring admission or immediate intervention beyond what has been performed at this time.  They will follow up with PCP. We also discussed returning to the ED immediately if new or worsening sx occur. We discussed the sx which are most concerning (e.g., sudden worsening pain, fever, inability to tolerate by mouth) that necessitate immediate return. Medications administered to the patient during their visit and  any new prescriptions provided to the patient are listed below.  Medications given during this visit Medications - No data to display   The patient appears reasonably screen and/or stabilized for discharge and I doubt any other medical condition or other [facility] requiring further screening, evaluation, or treatment in the ED at this time prior to discharge.          Final Clinical Impression(s) / ED Diagnoses Final diagnoses:  Nonspecific chest pain    Rx / DC Orders ED Discharge Orders     None         Melene Plan, DO 09/07/22 2050

## 2022-09-07 NOTE — ED Notes (Signed)
Assumed care of pt walking around halls with steady gait drinking and eating c/o gerd like symptoms and chest discomfort that began at 0900 this morning. Pt states she has hx of GERD and took a dose of omeprazole pta with no relief. Pt describes pain as squeezing and radiating into back. Pt a/o x 4 respirations even and non labored is talking on phone with family in no acute distress at present time.

## 2022-09-07 NOTE — ED Provider Triage Note (Signed)
Emergency Medicine Provider Triage Evaluation Note  Julie Hendrix , a 37 y.o. female  was evaluated in triage.  Patient complains of left-sided chest pain and some back discomfort.  Has been worsening since yesterday.  Has a history of GERD and indigestion however does not believe that this is related.  No shortness of breath but does report that the pain is worse when she takes a deep breath.  No recent travel, surgery, leg swelling or history of DVT/PE.  Patient does smoke daily.  When asked how much she says "a lot."  No history of ACS  Review of Systems  Positive:  Negative:   Physical Exam  BP (!) 154/89   Pulse 83   Temp 98.8 F (37.1 C) (Oral)   Resp 19   Ht 5\' 3"  (1.6 m)   Wt 116.1 kg   LMP 06/18/2022   SpO2 98%   BMI 45.35 kg/m  Gen:   Awake, no distress   Resp:  Normal effort  MSK:   Moves extremities without difficulty  Other:  RRR  Medical Decision Making  Medically screening exam initiated at 3:07 PM.  Appropriate orders placed.  Julie Hendrix was informed that the remainder of the evaluation will be completed by another provider, this initial triage assessment does not replace that evaluation, and the importance of remaining in the ED until their evaluation is complete.     Julie Benders, PA-C 09/07/22 [phone]

## 2022-09-07 NOTE — ED Triage Notes (Signed)
Pt c/o epigastric and CP that woke her up around 0900, radiating to mid back; described at tight and sharp; denies N/V, endorses some diaphoresis; warm and dry at present, NAD; pain worse with lying down; denies cardiac hx, has hx GERD, took omeprazole PTA, no relief
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
## 2022-11-24 ENCOUNTER — Other Ambulatory Visit: Payer: Self-pay

## 2022-11-24 ENCOUNTER — Ambulatory Visit (INDEPENDENT_AMBULATORY_CARE_PROVIDER_SITE_OTHER): Payer: Medicaid Other

## 2022-11-24 ENCOUNTER — Other Ambulatory Visit (HOSPITAL_COMMUNITY)
Admission: RE | Admit: 2022-11-24 | Discharge: 2022-11-24 | Disposition: A | Discharge status: Home / Self Care | Payer: Medicaid Other | Source: Ambulatory Visit | Attending: Family Medicine | Admitting: Family Medicine

## 2022-11-24 VITALS — BP 127/82 | HR 73 | Wt 249.9 lb

## 2022-11-24 DIAGNOSIS — N939 Abnormal uterine and vaginal bleeding, unspecified: Secondary | ICD-10-CM

## 2022-11-24 DIAGNOSIS — N898 Other specified noninflammatory disorders of vagina: Secondary | ICD-10-CM | POA: Diagnosis not present

## 2022-11-24 MED ORDER — MEGESTROL ACETATE 40 MG PO TABS: ORAL_TABLET | ORAL | 0 refills | Status: DC

## 2022-11-24 NOTE — Progress Notes (Signed)
Julie Hendrix is here with concern of vaginal odor. Has a history of recurrent BV. Would also like STD screening today. If BV is positive, would like Metrogel not Flagyl pills. Self swab instructions given and specimen obtained. Explained patient will be contacted with any abnormal results.   Patient reports ongoing vaginal bleeding. Has prescription for Megace which she takes only when bleeding. Once bleeding stops she will stop taking Megace. She is frustrated by the need to take a pill in order to stop the bleeding. Would like to talk about other options with OB/GYN. Patient knows her options are limited due to smoking cigarettes daily and her history of hypertension. Requests refill of Megace in the meantime, which is approved by Debroah Loop MD. Encouraged pt to take daily as prescribed to see if this will regulate bleeding.   Marjo Bicker, RN 11/24/2022  4:12 PM

## 2022-11-25 ENCOUNTER — Ambulatory Visit: Payer: Medicaid Other | Admitting: Student

## 2022-11-25 ENCOUNTER — Ambulatory Visit (HOSPITAL_COMMUNITY)
Admission: RE | Admit: 2022-11-25 | Discharge: 2022-11-25 | Disposition: A | Discharge status: Home / Self Care | Payer: Medicaid Other | Source: Ambulatory Visit | Attending: Family Medicine | Admitting: Family Medicine

## 2022-11-25 ENCOUNTER — Other Ambulatory Visit: Payer: Self-pay

## 2022-11-25 ENCOUNTER — Ambulatory Visit (INDEPENDENT_AMBULATORY_CARE_PROVIDER_SITE_OTHER): Payer: Medicaid Other

## 2022-11-25 ENCOUNTER — Encounter: Payer: Self-pay | Admitting: Student

## 2022-11-25 VITALS — BP 132/85 | HR 101 | Ht 61.0 in | Wt 251.6 lb

## 2022-11-25 DIAGNOSIS — I10 Essential (primary) hypertension: Secondary | ICD-10-CM

## 2022-11-25 DIAGNOSIS — M545 Low back pain, unspecified: Secondary | ICD-10-CM | POA: Diagnosis not present

## 2022-11-25 DIAGNOSIS — N921 Excessive and frequent menstruation with irregular cycle: Secondary | ICD-10-CM

## 2022-11-25 DIAGNOSIS — R002 Palpitations: Secondary | ICD-10-CM | POA: Diagnosis not present

## 2022-11-25 DIAGNOSIS — R600 Localized edema: Secondary | ICD-10-CM

## 2022-11-25 DIAGNOSIS — N898 Other specified noninflammatory disorders of vagina: Secondary | ICD-10-CM | POA: Diagnosis not present

## 2022-11-25 DIAGNOSIS — Z6841 Body Mass Index (BMI) 40.0 and over, adult: Secondary | ICD-10-CM

## 2022-11-25 DIAGNOSIS — G8929 Other chronic pain: Secondary | ICD-10-CM

## 2022-11-25 DIAGNOSIS — N939 Abnormal uterine and vaginal bleeding, unspecified: Secondary | ICD-10-CM

## 2022-11-25 MED ORDER — WEGOVY 0.25 MG/0.5ML ~~LOC~~ SOAJ
0.2500 mg | SUBCUTANEOUS | 1 refills | Status: DC
Start: 2022-11-25 — End: 2023-01-08

## 2022-11-25 MED ORDER — AMLODIPINE BESYLATE 10 MG PO TABS
10.0000 mg | ORAL_TABLET | Freq: Every day | ORAL | 2 refills | Status: DC
Start: 2022-11-25 — End: 2023-02-16

## 2022-11-25 MED ORDER — HYDROCHLOROTHIAZIDE 12.5 MG PO TABS
12.5000 mg | ORAL_TABLET | Freq: Every day | ORAL | 3 refills | Status: DC
Start: 2022-11-25 — End: 2023-01-08

## 2022-11-25 NOTE — Patient Instructions (Addendum)
I want you to buy compression stockings! You can find these at any medical supply store. Buy the ones that have a gradient (15-25).   For your palpitation, I am mailing you a monitor to wear for 14 days. You will mail it back at the end. I'm also checking some labs today to check your hemoglobin, electrolytes, and thyroid.   We're starting [employer]. This is once weekly injection. You may have some nausea when you start it. Be sure you drink enough!   Come back to see me in 4-8 weeks.   The physical therapists will call to make an appointment for your back.   Eliezer Mccoy, MD

## 2022-11-25 NOTE — Progress Notes (Signed)
    SUBJECTIVE:   CHIEF COMPLAINT / HPI:   Palpitations On and off for the past few weeks. Can happen at rest or with activity. Not accompanied by SOB or CP. No syncope/presyncope. Not on nodal blockers. No anxiety associated with these events. No clear pattern in terms of time of day, setting, etc.    Chronic Low Back Pain Bilateral. Worse with activity at work. 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 Getting massages sometimes helps. No fevers, weakness, sensory changes.   Weight Loss Efforts Referred to [facility] by Dr. Melba Coon several months back but did not follow-up on this. Remains interested in [place] now that Medicaid is covering it. No personal history of pancreatitis or medullary thyroid CA. No family hx of endocrine cancers.   BLE Edema Worse at the end of a long day. Feels a "fullness" in both of her calves and notices symmetric swelling in her lower legs. Improves by morning. No real pain with this, just the sense of fullness. No warmth or redness to the extremities.  PERTINENT  PMH / PSH: HTN, PCOS  OBJECTIVE:   BP 132/85   Pulse (!) 101   Ht 5\' 1"  (1.549 m)   Wt 251 lb 9.6 oz (114.1 kg)   SpO2 98%   BMI 47.54 kg/m   Physical Exam   EKG with NSR, rate 93. Normal PR interval and no delta wave to suggest WPW or other underlying arrhythmia.   ASSESSMENT/PLAN:   Palpitation Benign EKG today. No concerning underlying symptoms such as SOB/CP or syncope/presyncope. Does have heavy menses, so will check a CBC. - 14 day Zio patch ordered  - CBC, BMP, TSH, Mag  Back pain Chronic and without red flags. Suspect her weight and her physically demanding job are contributing here. She is open to PT which I think is a great first step in treating this.  - Ambulatory referral to physical therapy - Weight loss interventions as below  Morbid obesity with body mass index (BMI) of 45.0 to 49.9 in adult [facility]) - Wegovy 0.25mg  weekly - Follow-up in  4-8 weeks   Bilateral lower extremity edema No warmth, pain. Bilateral nature makes something concerning like DVT far less likely. Pattern of worsening over the course of the day suggests this may be a component of venous insufficient vs physiologic edema. - Recommend graduated compression stockings      J Dorothyann Gibbs, MD [facility]

## 2022-11-25 NOTE — Progress Notes (Unsigned)
Enrolled patient for a 14 day Zio XT monitor to be mailed to patients home  Chandrasekhar to read

## 2022-11-27 ENCOUNTER — Telehealth: Payer: Self-pay

## 2022-11-27 DIAGNOSIS — R002 Palpitations: Secondary | ICD-10-CM | POA: Insufficient documentation

## 2022-11-27 DIAGNOSIS — R600 Localized edema: Secondary | ICD-10-CM | POA: Insufficient documentation

## 2022-11-27 HISTORY — DX: Palpitations: R00.2

## 2022-11-27 NOTE — Assessment & Plan Note (Signed)
No warmth, pain. Bilateral nature makes something concerning like DVT far less likely. Pattern of worsening over the course of the day suggests this may be a component of venous insufficient vs physiologic edema. - Recommend graduated compression stockings

## 2022-11-27 NOTE — Assessment & Plan Note (Addendum)
Benign EKG today. No concerning underlying symptoms such as SOB/CP or syncope/presyncope. Does have heavy menses, so will check a CBC. - 14 day Zio patch ordered  - CBC, BMP, TSH, Mag

## 2022-11-27 NOTE — Assessment & Plan Note (Signed)
-   QIONGE 0.25mg  weekly - Follow-up in 4-8 weeks

## 2022-11-27 NOTE — Assessment & Plan Note (Signed)
Chronic and without red flags. Suspect her weight and her physically demanding job are contributing here. She is open to PT which I think is a great first step in treating this.  - Ambulatory referral to physical therapy - Weight loss interventions as below

## 2022-11-27 NOTE — Telephone Encounter (Signed)
Called Pt to see if she received medication for her +BV test result, no answer, left VM.

## 2022-11-29 DIAGNOSIS — R002 Palpitations: Secondary | ICD-10-CM

## 2022-12-04 ENCOUNTER — Telehealth: Payer: Self-pay

## 2022-12-04 NOTE — Telephone Encounter (Signed)
Pharmacy Patient Advocate Encounter   Received notification from CoverMyMeds that prior authorization for [facility] is required/requested.   Insurance verification completed.   The patient is insured through [facility] .   Per test claim: PA required; PA submitted to [facility] via CoverMyMeds Key/confirmation #/EOC [id number]. Status is pending

## 2022-12-06 ENCOUNTER — Emergency Department (HOSPITAL_BASED_OUTPATIENT_CLINIC_OR_DEPARTMENT_OTHER)
Admission: EM | Admit: 2022-12-06 | Discharge: 2022-12-07 | Disposition: A | Discharge status: Home / Self Care | Payer: Medicaid Other | Attending: Emergency Medicine | Admitting: Emergency Medicine

## 2022-12-06 ENCOUNTER — Encounter (HOSPITAL_BASED_OUTPATIENT_CLINIC_OR_DEPARTMENT_OTHER): Payer: Self-pay

## 2022-12-06 DIAGNOSIS — R0789 Other chest pain: Secondary | ICD-10-CM | POA: Diagnosis not present

## 2022-12-06 DIAGNOSIS — R079 Chest pain, unspecified: Secondary | ICD-10-CM

## 2022-12-06 DIAGNOSIS — R42 Dizziness and giddiness: Secondary | ICD-10-CM | POA: Insufficient documentation

## 2022-12-06 DIAGNOSIS — H538 Other visual disturbances: Secondary | ICD-10-CM | POA: Diagnosis not present

## 2022-12-06 DIAGNOSIS — Z79899 Other long term (current) drug therapy: Secondary | ICD-10-CM | POA: Diagnosis not present

## 2022-12-06 DIAGNOSIS — R519 Headache, unspecified: Secondary | ICD-10-CM | POA: Diagnosis not present

## 2022-12-06 DIAGNOSIS — I10 Essential (primary) hypertension: Secondary | ICD-10-CM | POA: Insufficient documentation

## 2022-12-06 DIAGNOSIS — F17200 Nicotine dependence, unspecified, uncomplicated: Secondary | ICD-10-CM | POA: Diagnosis not present

## 2022-12-06 NOTE — ED Triage Notes (Addendum)
Pt c/o "HA x4-5 days now, some dizziness & vision changes." States symptoms prompted her to check BP, noted to be 176/129. Pt c/o "some CP, but they're working on checking that out." Pt reports that she has been wearing heart monitor since 8/12, fell off 8/15 "but they're trying to figure out why it races sometimes."  Denies weakness, unilateral deficit

## 2022-12-07 ENCOUNTER — Other Ambulatory Visit: Payer: Self-pay | Admitting: Student

## 2022-12-07 NOTE — ED Provider Notes (Signed)
[facility] EMERGENCY DEPARTMENT AT [facility] Provider Note  CSN: [id number] Arrival date & time: 12/06/22 2318  Chief Complaint(s) No chief complaint on file.  HPI [employer] Greenly Llera is a 37 y.o. female with a past medical history listed below who presents to the emergency department for elevated blood pressures.  She reports feeling intermittent headaches for the past 4 to 5 days.  Headaches are migrating throughout head.  Lasting 10 to 15 minutes at a time and self resolving.  Patient may have several episodes throughout the day.  At times reports feeling dizzy and blurry vision.  This occurred this evening while at church and she decided to check her blood pressure noting that it was elevated.  She reports that she has been compliant with her blood pressure medication.  She denies recent fevers or infections.  No coughing or congestion.  No nausea or vomiting.  No focal deficits.  Additionally she endorses having longstanding migratory intermittent chest pain.  Pains also last several minutes and self resolved.  They are nonexertional and nonradiating.  Patient has been worked up for this in the past.  States that she had a Zio patch given to her on the 12 but was only on for several days when it fell off in the shower.  She had this on for evaluation of palpitations.  Currently she is headache and chest pain-free.  The history is provided by the patient.    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 Started 12/2018, trying to quit.    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 Authorizing Provider  amLODipine (NORVASC) 10 MG tablet Take 1 tablet (10 mg total) by mouth daily. 11/25/22   Alicia Amel, MD  hydrochlorothiazide (HYDRODIURIL) 12.5 MG tablet Take 1 tablet (12.5 mg total) by mouth daily. 11/25/22   Alicia Amel, MD  megestrol (MEGACE) 40 MG tablet Take 2 tablets by mouth daily. Can increase to 2 tablets twice daily in the event of heavy bleeding. 11/24/22   Adam Phenix, MD  metroNIDAZOLE (METROGEL) 0.75 % vaginal gel Place 1 Applicatorful vaginally 2 (two) times daily as needed (vaginal irritation). Patient not taking: Reported on 11/24/2022 06/04/22   Alicia Amel, MD  psyllium (METAMUCIL SMOOTH TEXTURE) 58.6 % powder Take 1 packet by mouth 3 (three) times daily. 08/11/22   Sabino Dick, DO  Semaglutide-Weight Management (WEGOVY) 0.25 MG/0.5ML SOAJ Inject 0.25 mg into the skin once a week. 11/25/22   Alicia Amel, MD  Allergies Patient has no known allergies.  Review of Systems Review of Systems As noted in HPI  Physical Exam Vital Signs  I have reviewed the triage vital signs BP (!) 135/91   Pulse 78   Temp 97.9 F (36.6 C)   Resp 18   SpO2 100%   Physical Exam Vitals reviewed.  Constitutional:      General: She is not in acute distress.    Appearance: She is  well-developed. She is not diaphoretic.  HENT:     Head: Normocephalic and atraumatic.     Nose: Nose normal.  Eyes:     General: No scleral icterus.       Right eye: No discharge.        Left eye: No discharge.     Conjunctiva/sclera: Conjunctivae normal.     Pupils: Pupils are equal, round, and reactive to light.  Cardiovascular:     Rate and Rhythm: Normal rate and regular rhythm.     Heart sounds: No murmur heard.    No friction rub. No gallop.  Pulmonary:     Effort: Pulmonary effort is normal. No respiratory distress.     Breath sounds: Normal breath sounds. No stridor. No rales.  Abdominal:     General: There is no distension.     Palpations: Abdomen is soft.     Tenderness: There is no abdominal tenderness.  Musculoskeletal:        General: No tenderness.     Cervical back: Normal range of motion and neck supple.  Skin:    General: Skin is warm and dry.     Findings: No erythema or rash.  Neurological:     Mental Status: She is alert and oriented to person, place, and time.     ED Results and Treatments Labs (all labs ordered are listed, but only abnormal results are displayed) Labs Reviewed - No data to display                                                                                                                       EKG  EKG Interpretation Date/Time:  Sunday December 06 2022 23:31:34 EDT Ventricular Rate:  94 PR Interval:  158 QRS Duration:  83 QT Interval:  345 QTC Calculation: 432 R Axis:   93  Text Interpretation: Sinus rhythm Borderline right axis deviation No acute changes Confirmed by Drema Pry [phone]) on 12/07/2022 12:31:52 AM       Radiology No results found.  Medications Ordered in ED Medications - No data to display Procedures Procedures  (including critical care time) Medical Decision Making / ED Course   Medical Decision Making Amount and/or Complexity of Data Reviewed ECG/medicine tests: ordered and independent  interpretation performed. Decision-making details documented in ED Course.   DDX considered below  Intermittent headache Non focal neuro exam.  No fever. Doubt meningitis.  Doubt IIH. No recent head trauma. Doubt intracranial bleed.  No indication for imaging.  Favoring tension headaches.  Intermittent chest pain. EKG w/o acute ischemic changes, evidence of pericarditis, dysrhythmias or blocks. Highly atypical for ACS.  Do not feel cardiac markers are necessary at this time, but offered work up. With shared decision making patient opted to defer. Doubt aortic dissection, esophageal perforation, pneumothorax, pneumonia, PE. Favor MSK vs GI. BP resolved w/o intervention      Final Clinical Impression(s) / ED Diagnoses Final diagnoses:  Intermittent headache  Intermittent chest pain   The patient appears reasonably screened and/or stabilized for discharge and I doubt any other medical condition or other [facility] requiring further screening, evaluation, or treatment in the ED at this time. I have discussed the findings, Dx and Tx plan with the patient/family who expressed understanding and agree(s) with the plan. Discharge instructions discussed at length. The patient/family was given strict return precautions who verbalized understanding of the instructions. No further questions at time of discharge.  Disposition: Discharge  Condition: Good  ED Discharge Orders     None       Follow Up: Primary care provider  Call  to schedule an appointment for close follow up    This chart was dictated using voice recognition software.  Despite best efforts to proofread,  errors can occur which can change the documentation meaning.    Nira Conn, MD 12/07/22 0200

## 2022-12-07 NOTE — ED Notes (Signed)
Patient verbalizes understanding of discharge instructions. Opportunity for questioning and answers were provided. Armband removed by staff, pt discharged from ED. Ambulated out to lobby with mother  

## 2022-12-07 NOTE — Telephone Encounter (Signed)
Pharmacy Patient Advocate Encounter  Received notification from [facility] that Prior Authorization for [facility] has been DENIED. Please advise how you'd like to proceed. Full denial letter will be uploaded to the media tab. See denial reason below.    Will most likely need documentation showing patient is making lifestyle modification including nutrition and physical activity (unless physical activity not clinically appropriate).

## 2022-12-08 ENCOUNTER — Encounter: Payer: Self-pay | Admitting: Student

## 2022-12-09 ENCOUNTER — Other Ambulatory Visit (HOSPITAL_COMMUNITY): Payer: Self-pay

## 2022-12-09 MED ORDER — METRONIDAZOLE 0.75 % VA GEL
1.0000 | Freq: Two times a day (BID) | VAGINAL | 0 refills | Status: DC | PRN
  Filled 2022-12-09: qty 70, 4d supply, fill #0

## 2022-12-17 ENCOUNTER — Ambulatory Visit (INDEPENDENT_AMBULATORY_CARE_PROVIDER_SITE_OTHER): Payer: Medicaid Other | Admitting: Student

## 2022-12-17 ENCOUNTER — Other Ambulatory Visit (HOSPITAL_COMMUNITY): Payer: Self-pay

## 2022-12-17 VITALS — BP 124/74 | HR 72

## 2022-12-17 DIAGNOSIS — Z20822 Contact with and (suspected) exposure to covid-19: Secondary | ICD-10-CM | POA: Diagnosis not present

## 2022-12-17 LAB — POC SOFIA SARS ANTIGEN FIA: SARS Coronavirus 2 Ag: NEGATIVE

## 2022-12-17 NOTE — Assessment & Plan Note (Signed)
POC COVID antigen test negative. Discussed results with patient. Encouraged her to avoid close contact with known household sick contacts that are positive for COVID Encouraged to wear mask around others until she feels well. Discussed symptomatic management with fluid hydration, honey for cough, rest. Return precautions discussed

## 2022-12-17 NOTE — Progress Notes (Signed)
    SUBJECTIVE:   CHIEF COMPLAINT / HPI:   Layonna Arcos is a 37 year old female here for request for COVID-19 testing.  Her daughter who lives with her tested positive for COVID yesterday. Patient reports that she is having a little bit of a cough and congestion.  Otherwise, she does not have any nausea, vomiting, fevers or chills. Her mother is at the urgent care getting tested for COVID right now as well.   PERTINENT  PMH / PSH: Obesity, hypertension, prediabetes  OBJECTIVE:   BP 124/74   Pulse 72   SpO2 98%   General: Wearing facemask, pleasant, generally well-appearing HEENT: Mild nasal congestion, but no rhinorrhea.  No oropharyngeal erythema. Cardiac: Regular rate and rhythm Respiratory: Normal work of breathing on room air.  No wheezing or crackles.  Good air movement throughout all lung fields. Extremities: Warm and well-perfused   ASSESSMENT/PLAN:   Close exposure to COVID-19 virus POC COVID antigen test negative. Discussed results with patient. Encouraged her to avoid close contact with known household sick contacts that are positive for COVID Encouraged to wear mask around others until she feels well. Discussed symptomatic management with fluid hydration, honey for cough, rest. Return precautions discussed     Darral Dash, DO [facility]

## 2022-12-17 NOTE — Patient Instructions (Signed)
It was great seeing you today.  As we discussed, -I will call you with your COVID test results. -If you are positive, you should remain home if symptomatic. -Make sure to wash your hands and avoid contact with others who are unwell.  I hope that your daughter feels better soon.   If you have any questions or concerns, please feel free to call the clinic.   Have a wonderful day,  Dr. Darral Dash [facility] [phone]

## 2022-12-23 ENCOUNTER — Encounter: Payer: Self-pay | Admitting: Physical Therapy

## 2022-12-23 ENCOUNTER — Other Ambulatory Visit: Payer: Self-pay

## 2022-12-23 ENCOUNTER — Ambulatory Visit: Payer: Medicaid Other | Attending: Family Medicine | Admitting: Physical Therapy

## 2022-12-23 DIAGNOSIS — G8929 Other chronic pain: Secondary | ICD-10-CM | POA: Insufficient documentation

## 2022-12-23 DIAGNOSIS — M5459 Other low back pain: Secondary | ICD-10-CM | POA: Insufficient documentation

## 2022-12-23 DIAGNOSIS — M545 Low back pain, unspecified: Secondary | ICD-10-CM | POA: Insufficient documentation

## 2022-12-23 DIAGNOSIS — M6281 Muscle weakness (generalized): Secondary | ICD-10-CM | POA: Insufficient documentation
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
## 2022-12-23 NOTE — Patient Instructions (Signed)
Access Code: [email or web address] URL: [email or web address] Date: 12/23/2022 Prepared by: Rosana Hoes  Exercises - Supine Lower Trunk Rotation  - 1 x daily - 10 reps - 5 seconds hold - Bridge  - 1 x daily - 2 sets - 10 reps - 3 seconds hold - Clam with Resistance  - 1 x daily - 2 sets - 10 reps - Standing Bilateral Low Shoulder Row with Anchored Resistance  - 1 x daily - 2 sets - 10 reps

## 2022-12-24 ENCOUNTER — Ambulatory Visit (INDEPENDENT_AMBULATORY_CARE_PROVIDER_SITE_OTHER): Payer: Medicaid Other | Admitting: Student

## 2022-12-24 ENCOUNTER — Encounter: Payer: Self-pay | Admitting: Student

## 2022-12-24 ENCOUNTER — Other Ambulatory Visit: Payer: Self-pay

## 2022-12-24 VITALS — BP 126/74 | HR 93 | Ht 63.0 in | Wt 251.6 lb

## 2022-12-24 DIAGNOSIS — Z6841 Body Mass Index (BMI) 40.0 and over, adult: Secondary | ICD-10-CM

## 2022-12-24 DIAGNOSIS — G44219 Episodic tension-type headache, not intractable: Secondary | ICD-10-CM

## 2022-12-24 DIAGNOSIS — Z9189 Other specified personal risk factors, not elsewhere classified: Secondary | ICD-10-CM
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
## 2022-12-24 NOTE — Patient Instructions (Signed)
I want you to target AT LEAST 140g of protein daily. You are NOT going to overdo it. I also want you to target >30g of fiber daily.  Download the [employer]. And use it for the next 3-4 week to track ALL of your food.   Come back to see me in ~3-4 weeks to see how you're doing.   Here's a guide to how much exercise the body needs. These should be minutes of dedicated moderate-difficulty cardiovascular exercise. Hiking, jogging, swimming, biking, rucking, or pilates are all good options. As you're starting out, a brisk walk is likely enough of a stress on your body, but as you adapt, the exercises should become more challenging.  150 min/week to maintain and improve cardiovascular health 150-250 min/week to prevent weight gain 225-420 min/week (1 hr/day!) to promote meaningful weight loss 200-300 min/week to prevent recurrent weight gain after loss   In addition, resistance exercises such as working out with resistance bands/weights/weight machines or calisthenics is a MUST. These exercises should be done twice weekly at minimum and the weights should get heavier with time.     I am ordering a sleep study, the sleep center will call you to schedule this.  Tylenol and ibuprofen are fine for the headaches, but I think ultimately we need to fix your sleep to fix this problem.   Eliezer Mccoy, MD

## 2022-12-25 DIAGNOSIS — G44219 Episodic tension-type headache, not intractable: Secondary | ICD-10-CM

## 2022-12-25 HISTORY — DX: Episodic tension-type headache, not intractable: G44.219

## 2022-12-25 NOTE — Assessment & Plan Note (Signed)
Symptoms consistent with tension-type headaches. 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 - Continue Tylenol PRN for symptoms - I have ordered a sleep study

## 2022-12-25 NOTE — Assessment & Plan Note (Signed)
Introduced the idea of tracking her intake today. Downloading Cronometer app.  - Targeting >140g protein daily - >30g fiber daily given she is not a great candidate for low carb given her preferred cultural eating pattern - Discussed both aerobic and resistance training, already has a [employer] membership - f/u in 3-4 weeks

## 2022-12-28 ENCOUNTER — Ambulatory Visit: Payer: Medicaid Other

## 2022-12-28 DIAGNOSIS — M6281 Muscle weakness (generalized): Secondary | ICD-10-CM

## 2022-12-28 DIAGNOSIS — M5459 Other low back pain: Secondary | ICD-10-CM

## 2022-12-28 DIAGNOSIS — G8929 Other chronic pain: Secondary | ICD-10-CM | POA: Diagnosis not present

## 2022-12-28 DIAGNOSIS — M545 Low back pain, unspecified: Secondary | ICD-10-CM | POA: Diagnosis not present

## 2022-12-28 NOTE — Therapy (Signed)
OUTPATIENT PHYSICAL THERAPY TREATMENT   Patient Name: Julie Hendrix MRN: [id number] DOB:April 23, 1985, 37 y.o., female Today's Date: 12/28/2022   END OF SESSION:  PT End of Session - 12/28/22 1529     Visit Number 2    Number of Visits 9    Date for PT Re-Evaluation 02/17/23    Authorization Type [employer]    PT Start Time 1530    Activity Tolerance Patient tolerated treatment well    Behavior During Therapy [facility] for tasks assessed/performed              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 Started 12/2018, trying to quit.    Past Surgical History:  Procedure Laterality Date   COLPOSCOPY     CRYOTHERAPY  06/08/2017   cervix   Patient Active Problem List   Diagnosis Date Noted   Episodic tension-type headache, not intractable 12/25/2022   Close exposure to COVID-19 virus 12/17/2022   Palpitation 11/27/2022   Bilateral lower extremity edema 11/27/2022   Cubital tunnel syndrome of both upper extremities 08/11/2022   Heavy menses 08/11/2022   Chest discomfort 10/31/2020   Back pain 09/06/2019   Gastroesophageal reflux disease 06/06/2019   Prediabetes 06/17/2018   Essential hypertension 05/18/2018   BMI 40.0-44.9, adult (HCC) 11/30/2017   PCOS (polycystic ovarian syndrome) 11/30/2017   Low grade squamous intraepithelial lesion on cytologic smear of cervix (LGSIL) 05/21/2017    PCP: Alicia Amel, MD  REFERRING PROVIDER: Doreene Eland, MD  REFERRING DIAG: Chronic bilateral low back pain without sciatica  Rationale for Evaluation and Treatment: Rehabilitation  THERAPY DIAG:  No diagnosis found.  ONSET DATE: Ongoing for years   SUBJECTIVE:                                                                                                                                                                                           SUBJECTIVE STATEMENT: Pt presents to PT with reports of L lateral hip pain. Has been compliant with HEP.   PERTINENT HISTORY:  See PMH above  PAIN:  Are you having pain? Yes:  NPRS scale: 3/10 (7-8/10 with standing at work) Pain location: Lower and upper back Pain description: Achy, tight Aggravating factors: Standing extended periods, activity Relieving factors: Massages, straightening up her back (sitting upright)  PRECAUTIONS: None  RED  FLAGS: None   WEIGHT BEARING RESTRICTIONS: No  FALLS:  Has patient fallen in last 6 months? No  OCCUPATION: [employer]  PLOF: Independent  PATIENT GOALS: Pain relief with work   OBJECTIVE:  PATIENT SURVEYS:  FOTO 70% functional status (predicted 69%)  COGNITION: Overall cognitive status: Within functional limits for tasks assessed     SENSATION: WFL  MUSCLE LENGTH: Hamstring WFL  POSTURE:   Rounded shoulders, increased lumbar lordosis  PALPATION: Tender to palpation with increased muscle tension bilateral lumbar and thoracic paraspinals  LUMBAR ROM:   AROM eval  Flexion 75%  Extension WFL  Right lateral flexion WFL  Left lateral flexion WFL  Right rotation WFL  Left rotation WFL   (Blank rows = not tested)  Patient reports low back tightness with all movement  LOWER EXTREMITY ROM:      LE ROM grossly WFL  LOWER EXTREMITY MMT:    MMT Right eval Left eval  Hip flexion 4 4  Hip extension 4- 4-  Hip abduction 4- 4-  Hip adduction    Hip internal rotation    Hip external rotation    Knee flexion 5 5  Knee extension 5 5  Ankle dorsiflexion    Ankle plantarflexion    Ankle inversion    Ankle eversion     (Blank rows = not tested)  LUMBAR SPECIAL TESTS:  Radicular testing negative  FUNCTIONAL TESTS:  Not assessed  GAIT: Assistive device utilized:  None Level of assistance: Complete Independence Comments: WFL   TODAY'S TREATMENT:   [facility] Adult PT Treatment:                                                DATE: 12/28/2022 Therapeutic Exercise: NuStep lvl 5 UE/LE while taking subjective LTR 10 x 5 sec Supine fig 4 stretch x 30" Bridge 2x10 - 3 sec Supine clamshell 3x15 GTB Supine piriformis stretch x 30" each Supine PPT 2x10 - 3" hold Supine PPT with ball 2x10 - 3" hold Row 2x10 17# Pallof press 2x10 10# Lateral walk RTB x 3 laps at counter  [facility] Adult PT Treatment:                                                DATE: 12/23/2022 Therapeutic Exercise: LTR 5 x 5 sec Bridge 10 x 3 sec Side clamshell with red x 10 each Row with blue x 10  PATIENT EDUCATION:  Education details: Exam findings, POC, HEP Person educated: Patient Education method: Explanation, Demonstration, Tactile cues, Verbal cues, and Handouts Education comprehension: verbalized understanding, returned demonstration, verbal cues required, tactile cues required, and needs further education  HOME EXERCISE PROGRAM: Access Code: [email or web address]  ASSESSMENT:  CLINICAL IMPRESSION: Pt was able to complete all prescribed exercises with no adverse effect. Therapy today focused   (EVAL): Patient is a 37 y.o. female who was seen today for physical therapy evaluation and treatment for chronic back pain. Evaluation limited due to patient arriving late. She demonstrates limitation and report of tightness with lumbar motion, gross strength deficits of her core and hips, postural deficits, and reduced tolerance for work related activities such as standing that are impacting her functional ability.    OBJECTIVE IMPAIRMENTS: decreased activity  tolerance, decreased ROM, decreased strength, postural dysfunction, and pain.   ACTIVITY LIMITATIONS: lifting, bending, standing, and locomotion level  PARTICIPATION LIMITATIONS: cleaning, community activity, and occupation  PERSONAL FACTORS:  Fitness, Past/current experiences, and Time since onset of injury/illness/exacerbation are also affecting patient's functional outcome.   REHAB POTENTIAL: Good  CLINICAL DECISION MAKING: Stable/uncomplicated  EVALUATION COMPLEXITY: Low   GOALS: Goals reviewed with patient? Yes  SHORT TERM GOALS: Target date: 01/20/2023  Patient will be I with initial HEP in order to progress with therapy. Baseline: HEP provided at eval Goal status: INITIAL  2.  Patient will report back pain </= 5/10 with work related tasks such as standing for extended periods in order to reduce functional limitations Baseline: 7-8/10 Goal status: INITIAL  3.  Patient will demonstrate lumbar AROM WFL and without an increase in pain in order to improve ability to perform household tasks Baseline: limitation with lumbar flexion and report of pain with all lumbar movement Goal status: INITIAL  LONG TERM GOALS: Target date: 02/17/2023  Patient will be I with final HEP to maintain progress from PT. Baseline: HEP provided at eval Goal status: INITIAL  2.  Patient will report an improvement in her baseline FOTO score in order to indicate an improvement in her functional ability Baseline: 70% functional status Goal status: INITIAL  3.  Patient will demonstrate gross hip strength >/= 4/5 MMT in order to improve her lifting and standing tolerance Baseline: hip strength grossly 4-/5 MMT Goal status: INITIAL  4.  Patient will report </= 2/10 back pain with all work related tasks in order to reduce her functional limitations Baseline: 7-8/10 at eval Goal status: INITIAL   PLAN: PT FREQUENCY: 1x/week  PT DURATION: 8 weeks  PLANNED INTERVENTIONS: Therapeutic exercises, Therapeutic activity, Neuromuscular re-education, Balance training, Gait training, Patient/Family education, Self Care, Joint mobilization, Joint manipulation, Aquatic Therapy, Dry Needling, Spinal manipulation, Spinal mobilization, Cryotherapy, Moist  heat, Manual therapy, and Re-evaluation.  PLAN FOR NEXT SESSION: Review HEP and progress PRN, manual/TPDN for lumbar paraspinals, progress core stabilization and hip strengthening, progress to standing exercises and lifting progression   Eloy End PT  12/28/22 3:30 PM

## 2022-12-30 ENCOUNTER — Other Ambulatory Visit: Payer: Self-pay

## 2022-12-30 ENCOUNTER — Encounter: Payer: Self-pay | Admitting: Obstetrics and Gynecology

## 2022-12-30 ENCOUNTER — Ambulatory Visit: Payer: Medicaid Other | Admitting: Obstetrics and Gynecology

## 2022-12-30 VITALS — BP 138/91 | HR 86 | Wt 251.1 lb

## 2022-12-30 DIAGNOSIS — N939 Abnormal uterine and vaginal bleeding, unspecified: Secondary | ICD-10-CM | POA: Diagnosis not present

## 2022-12-30 MED ORDER — TRANEXAMIC ACID 650 MG PO TABS
1300.0000 mg | ORAL_TABLET | Freq: Three times a day (TID) | ORAL | 2 refills | Status: DC
Start: 2022-12-30 — End: 2023-02-16

## 2022-12-30 NOTE — Progress Notes (Signed)
GYNECOLOGY VISIT  Patient name: Julie Hendrix MRN [id number]  Date of birth: 23-Oct-1985 Chief Complaint:   Menorrhagia  History:  Julie Hendrix is a 37 y.o. [id number] being seen today for AUB.  Presetnes with heavy bleeding that has improved with megace. Wondering why she has hevy bleeding. Tried depo previously but did not like it due to weight gain. Bleeding since children. Taking it every day and may miss a few days and then start bleeding. Interested in conceiving with her partner. Last year at some time had a prolonged episode of bleeding.   Interested in getting pregnant. Has been trying to conceive. Not sure if she has a typical monthly cycle. It feels like when she starts it just doesn't go away. Has not used OPK at home. 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 Started 12/2018, trying to quit.     Past Surgical History:  Procedure Laterality Date   COLPOSCOPY     CRYOTHERAPY  06/08/2017   cervix    The following portions of the patient's history were reviewed and updated as appropriate: allergies, current medications, past family history, past medical history, past social history, past surgical history and problem list.   Health Maintenance:   Last pap     Component Value Date/Time   DIAGPAP  06/24/2022 1623    - Negative for intraepithelial lesion or malignancy (NILM)   DIAGPAP  06/20/2019 0956    - Negative for intraepithelial lesion or malignancy (NILM)   DIAGPAP (A) 05/29/2016 0000    ATYPICAL SQUAMOUS CELLS OF UNDETERMINED SIGNIFICANCE (ASC-US).   HPVHIGH Negative 06/24/2022 1623   HPVHIGH Negative 06/20/2019 0956   ADEQPAP  06/24/2022 1623    Satisfactory for  evaluation; transformation zone component ABSENT.   ADEQPAP  06/20/2019 0956    Satisfactory for evaluation; transformation zone component PRESENT.   ADEQPAP (A) 05/29/2016 0000    Satisfactory for evaluation  endocervical/transformation zone component PRESENT.    High Risk HPV: Positive  Adequacy:  Satisfactory for evaluation, transformation zone component PRESENT  Diagnosis:  Atypical squamous cells of undetermined significance (ASC-US)  Last mammogram: n/a   Review of Systems:  Pertinent items are noted in HPI. Comprehensive review of systems was otherwise negative.   Objective:  Physical Exam BP (!) 145/90   Pulse 88   Wt 251 lb 1.6 oz (113.9 kg)   BMI 44.48 kg/m    Physical Exam Vitals and nursing note reviewed.  Constitutional:      Appearance: Normal appearance.  HENT:     Head: Normocephalic and atraumatic.  Pulmonary:     Effort: Pulmonary effort is normal.  Skin:    General: Skin is warm and dry.  Neurological:     General: No focal deficit present.     Mental Status: She is alert.  Psychiatric:        Mood and Affect: Mood normal.        Behavior: Behavior normal.        Thought Content: Thought content normal.        Judgment: Judgment normal.       Assessment & Plan:   1. Abnormal uterine bleeding (  AUB) Noted megace should not necessarily suppress ovulation but may have some effect in preventing pregnancy. Will switch to TXA for bleeding control. AMH and A1c as part of fertility workup. She is not actively attempting to get pregnant but would welcome a pregnancy. Discussed that age does play a factor in fertility. Recommend use of OPK once menses resume at normal interval. If menses are not at regular interval, plan for cyclic progestin. If no ovulation demonstrated on OPK or day 21 labs, will discuss ovulation induction. Noted that prior US does not show any clear structural contributions to bleeding or lack of conception.  - tranexamic acid  (LYSTEDA) 650 MG TABS tablet; Take 2 tablets (1,300 mg total) by mouth 3 (three) times daily. Take during menses for a maximum of five days  Dispense: 30 tablet; Refill: 2 - Anti mullerian hormone - HgB A1c   Routine preventative health maintenance measures emphasized.  Lorriane Shire, MD [facility], [facility]

## 2023-01-03 LAB — HEMOGLOBIN A1C
Est. average glucose Bld gHb Est-mCnc: 140 mg/dL
Hgb A1c MFr Bld: 6.5 % — ABNORMAL HIGH (ref 4.8–5.6)

## 2023-01-03 LAB — ANTI MULLERIAN HORMONE: ANTI-MULLERIAN HORMONE (AMH): 3.49 ng/mL

## 2023-01-06 ENCOUNTER — Encounter: Payer: Self-pay | Admitting: Pharmacist

## 2023-01-07 ENCOUNTER — Ambulatory Visit: Payer: Medicaid Other

## 2023-01-07 DIAGNOSIS — M6281 Muscle weakness (generalized): Secondary | ICD-10-CM

## 2023-01-07 DIAGNOSIS — M5459 Other low back pain: Secondary | ICD-10-CM

## 2023-01-07 DIAGNOSIS — M545 Low back pain, unspecified: Secondary | ICD-10-CM | POA: Diagnosis not present

## 2023-01-07 DIAGNOSIS — G8929 Other chronic pain: Secondary | ICD-10-CM | POA: Diagnosis not present

## 2023-01-07 NOTE — Therapy (Signed)
OUTPATIENT PHYSICAL THERAPY TREATMENT   Patient Name: Julie Hendrix MRN: [id number] DOB:1985-04-28, 37 y.o., female Today's Date: 01/07/2023   END OF SESSION:  PT End of Session - 01/07/23 1530     Visit Number 3    Number of Visits 9    Date for PT Re-Evaluation 02/17/23    Authorization Type [employer]    PT Start Time 1530    PT Stop Time 1608    PT Time Calculation (min) 38 min    Activity Tolerance Patient tolerated treatment well    Behavior During Therapy [facility] for tasks assessed/performed               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 Started 12/2018, trying to quit.    Past Surgical History:  Procedure Laterality Date   COLPOSCOPY     CRYOTHERAPY  06/08/2017   cervix   Patient Active Problem List   Diagnosis Date Noted   Episodic tension-type headache, not intractable 12/25/2022   Close exposure to COVID-19 virus 12/17/2022   Palpitation 11/27/2022   Bilateral lower extremity edema 11/27/2022   Cubital tunnel syndrome of both upper extremities 08/11/2022   Heavy menses 08/11/2022   Chest discomfort 10/31/2020   Back pain 09/06/2019   Gastroesophageal reflux disease 06/06/2019   Prediabetes 06/17/2018   Essential hypertension 05/18/2018   BMI 40.0-44.9, adult (HCC) 11/30/2017   PCOS (polycystic ovarian syndrome) 11/30/2017   Low grade squamous intraepithelial lesion on cytologic smear of cervix (LGSIL) 05/21/2017    PCP: Alicia Amel, MD  REFERRING PROVIDER: Doreene Eland, MD  REFERRING DIAG: Chronic bilateral low back pain without sciatica  Rationale for Evaluation and Treatment: Rehabilitation  THERAPY DIAG:  Other low back pain  Muscle weakness  (generalized)  ONSET DATE: Ongoing for years   SUBJECTIVE:                                                                                                                                                                                          SUBJECTIVE STATEMENT: Pt presents to PT with reports of slight decrease in lower back and L hip pain. Has been compliant with HEP with no adverse effect.   PERTINENT HISTORY:  See PMH above  PAIN:  Are you having pain? Yes:  NPRS scale: 3/10 (7-8/10 with standing at  work) Pain location: Lower and upper back Pain description: Achy, tight Aggravating factors: Standing extended periods, activity Relieving factors: Massages, straightening up her back (sitting upright)  PRECAUTIONS: None  RED FLAGS: None   WEIGHT BEARING RESTRICTIONS: No  FALLS:  Has patient fallen in last 6 months? No  OCCUPATION: [employer]  PLOF: Independent  PATIENT GOALS: Pain relief with work   OBJECTIVE:  PATIENT SURVEYS:  FOTO 70% functional status (predicted 69%)  COGNITION: Overall cognitive status: Within functional limits for tasks assessed     SENSATION: WFL  MUSCLE LENGTH: Hamstring WFL  POSTURE:   Rounded shoulders, increased lumbar lordosis  PALPATION: Tender to palpation with increased muscle tension bilateral lumbar and thoracic paraspinals  LUMBAR ROM:   AROM eval  Flexion 75%  Extension WFL  Right lateral flexion WFL  Left lateral flexion WFL  Right rotation WFL  Left rotation WFL   (Blank rows = not tested)  Patient reports low back tightness with all movement  LOWER EXTREMITY ROM:      LE ROM grossly WFL  LOWER EXTREMITY MMT:    MMT Right eval Left eval  Hip flexion 4 4  Hip extension 4- 4-  Hip abduction 4- 4-  Hip adduction    Hip internal rotation    Hip external rotation    Knee flexion 5 5  Knee extension 5 5  Ankle dorsiflexion    Ankle plantarflexion    Ankle inversion    Ankle eversion     (Blank  rows = not tested)  LUMBAR SPECIAL TESTS:  Radicular testing negative  FUNCTIONAL TESTS:  Not assessed  GAIT: Assistive device utilized: None Level of assistance: Complete Independence Comments: WFL   TODAY'S TREATMENT:   [facility] Adult PT Treatment:                                                DATE: 01/07/2023 Therapeutic Exercise: NuStep lvl 5 UE/LE while taking subjective LTR 10 x 5 sec Supine PPT x 10 - 3" hold  Supine PPT with ball 2x10 Supine clamshell 2x15 blue band Bridge 3x10 Supine pilates SLR 2x10 each Seated fig 4 stretch 2x30" each Pallof press 2x10 10# Lateral walk RTB x 3 laps at counter  [facility] Adult PT Treatment:                                                DATE: 12/28/2022 Therapeutic Exercise: NuStep lvl 5 UE/LE while taking subjective LTR 10 x 5 sec Supine fig 4 stretch x 30" Bridge 2x10 - 3 sec Supine clamshell 3x15 GTB Supine piriformis stretch x 30" each Supine PPT 2x10 - 3" hold Supine PPT with ball 2x10 - 3" hold Row 2x10 17# Pallof press 2x10 10# Lateral walk RTB x 3 laps at counter  [facility] Adult PT Treatment:                                                DATE: 12/23/2022 Therapeutic Exercise: LTR 5 x 5 sec Bridge 10 x 3 sec Side clamshell with red x 10 each Row with  blue x 10  PATIENT EDUCATION:  Education details: Exam findings, POC, HEP Person educated: Patient Education method: Explanation, Demonstration, Tactile cues, Verbal cues, and Handouts Education comprehension: verbalized understanding, returned demonstration, verbal cues required, tactile cues required, and needs further education  HOME EXERCISE PROGRAM: Access Code: [email or web address]  ASSESSMENT:  CLINICAL IMPRESSION: Pt was able to complete all prescribed exercises with no adverse effect. Therapy today focused on improving core and proximal hip strength for decreasing lower back pain. She continues to benefit form skilled PT services, will continue to progress as able per POC.    (EVAL): Patient is a 37 y.o. female who was seen today for physical therapy evaluation and treatment for chronic back pain. Evaluation limited due to patient arriving late. She demonstrates limitation and report of tightness with lumbar motion, gross strength deficits of her core and hips, postural deficits, and reduced tolerance for work related activities such as standing that are impacting her functional ability.    OBJECTIVE IMPAIRMENTS: decreased activity tolerance, decreased ROM, decreased strength, postural dysfunction, and pain.   ACTIVITY LIMITATIONS: lifting, bending, standing, and locomotion level  PARTICIPATION LIMITATIONS: cleaning, community activity, and occupation  PERSONAL FACTORS: Fitness, Past/current experiences, and Time since onset of injury/illness/exacerbation are also affecting patient's functional outcome.   REHAB POTENTIAL: Good  CLINICAL DECISION MAKING: Stable/uncomplicated  EVALUATION COMPLEXITY: Low   GOALS: Goals reviewed with patient? Yes  SHORT TERM GOALS: Target date: 01/20/2023  Patient will be I with initial HEP in order to progress with therapy. Baseline: HEP provided at eval Goal status: INITIAL  2.  Patient will report back pain </= 5/10 with work related tasks such as standing for extended periods in order to reduce functional limitations Baseline: 7-8/10 Goal status: INITIAL  3.  Patient will demonstrate lumbar AROM WFL and without an increase in pain in order to improve ability to perform household tasks Baseline: limitation with lumbar flexion and report of pain with all lumbar movement Goal status: INITIAL  LONG TERM GOALS: Target date: 02/17/2023  Patient will be I with final HEP to maintain progress from PT. Baseline: HEP provided at eval Goal status: INITIAL  2.  Patient will report an improvement in her baseline FOTO score in order to indicate an improvement in her functional ability Baseline: 70% functional status Goal  status: INITIAL  3.  Patient will demonstrate gross hip strength >/= 4/5 MMT in order to improve her lifting and standing tolerance Baseline: hip strength grossly 4-/5 MMT Goal status: INITIAL  4.  Patient will report </= 2/10 back pain with all work related tasks in order to reduce her functional limitations Baseline: 7-8/10 at eval Goal status: INITIAL   PLAN: PT FREQUENCY: 1x/week  PT DURATION: 8 weeks  PLANNED INTERVENTIONS: Therapeutic exercises, Therapeutic activity, Neuromuscular re-education, Balance training, Gait training, Patient/Family education, Self Care, Joint mobilization, Joint manipulation, Aquatic Therapy, Dry Needling, Spinal manipulation, Spinal mobilization, Cryotherapy, Moist heat, Manual therapy, and Re-evaluation.  PLAN FOR NEXT SESSION: Review HEP and progress PRN, manual/TPDN for lumbar paraspinals, progress core stabilization and hip strengthening, progress to standing exercises and lifting progression   Eloy End PT  01/07/23 4:08 PM

## 2023-01-08 ENCOUNTER — Ambulatory Visit: Payer: Medicaid Other | Admitting: Student

## 2023-01-08 VITALS — BP 142/90 | HR 97 | Wt 249.6 lb

## 2023-01-08 DIAGNOSIS — E119 Type 2 diabetes mellitus without complications: Secondary | ICD-10-CM | POA: Diagnosis not present

## 2023-01-08 DIAGNOSIS — I10 Essential (primary) hypertension: Secondary | ICD-10-CM | POA: Diagnosis not present

## 2023-01-08 DIAGNOSIS — R7309 Other abnormal glucose: Secondary | ICD-10-CM | POA: Insufficient documentation

## 2023-01-08 MED ORDER — HYDROCHLOROTHIAZIDE 25 MG PO TABS: 25.0000 mg | ORAL_TABLET | Freq: Every day | ORAL | 3 refills | Status: DC

## 2023-01-08 MED ORDER — SEMAGLUTIDE(0.25 OR 0.5MG/DOS) 2 MG/1.5ML ~~LOC~~ SOPN: 0.2500 mg | PEN_INJECTOR | SUBCUTANEOUS | 3 refills | Status: DC

## 2023-01-08 NOTE — Patient Instructions (Addendum)
You do have diabetes, but just barely.  Go tear it up  at [employer].  I think you may do well to limit your carbohydrate intake to <100g per day. Come back to see me in 1 month.  You may be nauseated for the first few weeks on the Ozempic. This gets better in the VAST majority of patients.   I'm doubling up your hydrochlorothiazide dose. You can take two tablets until you pick up the new prescriptions.   Eliezer Mccoy, MD

## 2023-01-08 NOTE — Progress Notes (Unsigned)
    SUBJECTIVE:   CHIEF COMPLAINT / HPI:   Going full time at the [employer].   Was told by OB/Gyn that she had diabetes.  Has been eating high protien. Unfortunately still pretty on the starches/carbs. Not lifting weights but has a gym membership.   PERTINENT  PMH / PSH: ***  OBJECTIVE:   There were no vitals taken for this visit.  ***  ASSESSMENT/PLAN:   No problem-specific Assessment & Plan notes found for this encounter.     Eliezer Mccoy, MD [facility]

## 2023-01-10 NOTE — Assessment & Plan Note (Signed)
>>  ASSESSMENT AND PLAN FOR ELEVATED HEMOGLOBIN A1C WRITTEN ON 01/10/2023  7:22 PM BY Julie LYNWOOD NOVAK, MD  New diagnosis. Discussed therapeutic options including lifestyle modifications, metformin , and GLP-1a. Given we have already done pretty extensive counseling on Semaglutide , she is pretty motivated to pursue this as a medication option to augment her lifestyle changes. On review of her eating pattern, it sound like high protein is working for her, but perhaps the high fiber diet lends itself too closely to a high starch, overall very high carb diet. Instead we will shift course and try a high protein, relatively low carb (<100g carbs daily) way of eating.  - She will return in 1 mo to check on her eating pattern - Would evaluate renal function at that visit. Holding off for now as I'm adjusting her BP meds and will want to see what her chemistry looks like on increased dose hydrochlorothiazide .  - Semaglutide  0.25mg /week  - Hold off on metformin  now as I do not want to introduce too many agents that could cause GI upset at one time  - Resistance training is a must. She plans to start using her [employer] by the time she comes back to see me.

## 2023-01-10 NOTE — Assessment & Plan Note (Addendum)
New diagnosis. Discussed therapeutic options including lifestyle modifications, metformin, and GLP-1a. Given we have already done pretty extensive counseling on Semaglutide, she is pretty motivated to pursue this as a medication option to augment her lifestyle changes. On review of her eating pattern, it sound like high protein is working for her, but perhaps the high fiber diet lends itself too closely to a high starch, overall very high carb diet. Instead we will shift course and try a high protein, relatively low carb (<100g carbs daily) way of eating.  - She will return in 1 mo to check on her eating pattern - Would evaluate renal function at that visit. Holding off for now as I'm adjusting her BP meds and will want to see what her chemistry looks like on increased dose hydrochlorothiazide.  - Semaglutide 0.25mg /week  - Hold off on metformin now as I do not want to introduce too many agents that could cause GI upset at one time  - Resistance training is a must. She plans to start using her [employer] by the time she comes back to see me.

## 2023-01-10 NOTE — Assessment & Plan Note (Addendum)
Remains above goal on current regimen.  - Lifestyle changes as above - Increase hydrochlorothiazide to 25mg  daily. Checking chemistry at next visit as above to monitor renal function and electrolytes with this dose increase

## 2023-01-14 ENCOUNTER — Other Ambulatory Visit (HOSPITAL_COMMUNITY): Payer: Self-pay

## 2023-01-14 ENCOUNTER — Ambulatory Visit: Payer: Medicaid Other

## 2023-01-14 DIAGNOSIS — M5459 Other low back pain: Secondary | ICD-10-CM | POA: Diagnosis not present

## 2023-01-14 DIAGNOSIS — M545 Low back pain, unspecified: Secondary | ICD-10-CM | POA: Diagnosis not present

## 2023-01-14 DIAGNOSIS — M6281 Muscle weakness (generalized): Secondary | ICD-10-CM | POA: Diagnosis not present

## 2023-01-14 DIAGNOSIS — G8929 Other chronic pain: Secondary | ICD-10-CM | POA: Diagnosis not present

## 2023-01-14 NOTE — Therapy (Signed)
OUTPATIENT PHYSICAL THERAPY TREATMENT   Patient Name: Julie Hendrix MRN: [id number] DOB:02-Jan-1986, 37 y.o., female Today's Date: 01/14/2023   END OF SESSION:  PT End of Session - 01/14/23 1528     Visit Number 4    Number of Visits 9    Date for PT Re-Evaluation 02/17/23    Authorization Type [employer]    PT Start Time 1528    PT Stop Time 1606    PT Time Calculation (min) 38 min    Activity Tolerance Patient tolerated treatment well    Behavior During Therapy WFL for tasks assessed/performed               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 Started 12/2018, trying to quit.    Past Surgical History:  Procedure Laterality Date   COLPOSCOPY     CRYOTHERAPY  06/08/2017   cervix   Patient Active Problem List   Diagnosis Date Noted   Diabetes mellitus without complication (HCC) 01/08/2023   Episodic tension-type headache, not intractable 12/25/2022   Close exposure to COVID-19 virus 12/17/2022   Palpitation 11/27/2022   Bilateral lower extremity edema 11/27/2022   Cubital tunnel syndrome of both upper extremities 08/11/2022   Heavy menses 08/11/2022   Chest discomfort 10/31/2020   Back pain 09/06/2019   Gastroesophageal reflux disease 06/06/2019   Prediabetes 06/17/2018   Essential hypertension 05/18/2018   BMI 40.0-44.9, adult (HCC) 11/30/2017   PCOS (polycystic ovarian syndrome) 11/30/2017   Low grade squamous intraepithelial lesion on cytologic smear of cervix (LGSIL) 05/21/2017    PCP: Alicia Amel, MD  REFERRING PROVIDER: Doreene Eland, MD  REFERRING DIAG: Chronic bilateral low back pain without sciatica  Rationale for Evaluation and Treatment:  Rehabilitation  THERAPY DIAG:  Other low back pain  Muscle weakness (generalized)  ONSET DATE: Ongoing for years   SUBJECTIVE:                                                                                                                                                                                          SUBJECTIVE STATEMENT: Pt presents to PT with reports of continued lower back pain, although she continues to note improving symptoms. Has been compliant with HEP.   PERTINENT HISTORY:  See PMH above  PAIN:  Are you having pain? Yes:  NPRS  scale: 3/10 (7-8/10 with standing at work) Pain location: Lower and upper back Pain description: Achy, tight Aggravating factors: Standing extended periods, activity Relieving factors: Massages, straightening up her back (sitting upright)  PRECAUTIONS: None  RED FLAGS: None   WEIGHT BEARING RESTRICTIONS: No  FALLS:  Has patient fallen in last 6 months? No  OCCUPATION: [employer]  PLOF: Independent  PATIENT GOALS: Pain relief with work   OBJECTIVE:  PATIENT SURVEYS:  FOTO 70% functional status (predicted 69%)  COGNITION: Overall cognitive status: Within functional limits for tasks assessed     SENSATION: WFL  MUSCLE LENGTH: Hamstring WFL  POSTURE:   Rounded shoulders, increased lumbar lordosis  PALPATION: Tender to palpation with increased muscle tension bilateral lumbar and thoracic paraspinals  LUMBAR ROM:   AROM eval  Flexion 75%  Extension WFL  Right lateral flexion WFL  Left lateral flexion WFL  Right rotation WFL  Left rotation WFL   (Blank rows = not tested)  Patient reports low back tightness with all movement  LOWER EXTREMITY ROM:      LE ROM grossly WFL  LOWER EXTREMITY MMT:    MMT Right eval Left eval  Hip flexion 4 4  Hip extension 4- 4-  Hip abduction 4- 4-  Hip adduction    Hip internal rotation    Hip external rotation    Knee flexion 5 5  Knee extension 5 5  Ankle  dorsiflexion    Ankle plantarflexion    Ankle inversion    Ankle eversion     (Blank rows = not tested)  LUMBAR SPECIAL TESTS:  Radicular testing negative  FUNCTIONAL TESTS:  Not assessed  GAIT: Assistive device utilized: None Level of assistance: Complete Independence Comments: WFL   TODAY'S TREATMENT:   [facility] Adult PT Treatment:                                                DATE: 01/14/2023 Therapeutic Exercise: NuStep lvl 5 UE/LE while taking subjective LTR 5 x 5 sec Supine PPT x 10 - 3" hold  Supine clamshell 2x15 back band Bridge 2x10 with black band Supine pilates SLR 2x10 each 90/90 hold 3x15"  Seated fig 4 stretch 2x30" each Pallof press 2x10 10# Lateral walk RTB x 3 laps at counter Standing hip abd/ext 2x10 RTB Wall squat x 10   [facility] Adult PT Treatment:                                                DATE: 01/07/2023 Therapeutic Exercise: NuStep lvl 5 UE/LE while taking subjective LTR 10 x 5 sec Supine PPT x 10 - 3" hold  Supine PPT with ball 2x10 Supine clamshell 2x15 blue band Bridge 3x10 Supine pilates SLR 2x10 each Seated fig 4 stretch 2x30" each Pallof press 2x10 10# Lateral walk RTB x 3 laps at counter  [facility] Adult PT Treatment:                                                DATE: 12/28/2022 Therapeutic Exercise: NuStep lvl 5 UE/LE while taking subjective LTR  10 x 5 sec Supine fig 4 stretch x 30" Bridge 2x10 - 3 sec Supine clamshell 3x15 GTB Supine piriformis stretch x 30" each Supine PPT 2x10 - 3" hold Supine PPT with ball 2x10 - 3" hold Row 2x10 17# Pallof press 2x10 10# Lateral walk RTB x 3 laps at counter  [facility] Adult PT Treatment:                                                DATE: 12/23/2022 Therapeutic Exercise: LTR 5 x 5 sec Bridge 10 x 3 sec Side clamshell with red x 10 each Row with blue x 10  PATIENT EDUCATION:  Education details: Exam findings, POC, HEP Person educated: Patient Education method: Explanation, Demonstration,  Tactile cues, Verbal cues, and Handouts Education comprehension: verbalized understanding, returned demonstration, verbal cues required, tactile cues required, and needs further education  HOME EXERCISE PROGRAM: Access Code: [email or web address]  ASSESSMENT:  CLINICAL IMPRESSION: Pt was able to complete all prescribed exercises with no adverse effect. Therapy today focused on improving core and proximal hip strength for decreasing lower back pain. She continues to benefit form skilled PT services, will continue to progress as able per POC.   (EVAL): Patient is a 37 y.o. female who was seen today for physical therapy evaluation and treatment for chronic back pain. Evaluation limited due to patient arriving late. She demonstrates limitation and report of tightness with lumbar motion, gross strength deficits of her core and hips, postural deficits, and reduced tolerance for work related activities such as standing that are impacting her functional ability.    OBJECTIVE IMPAIRMENTS: decreased activity tolerance, decreased ROM, decreased strength, postural dysfunction, and pain.   ACTIVITY LIMITATIONS: lifting, bending, standing, and locomotion level  PARTICIPATION LIMITATIONS: cleaning, community activity, and occupation  PERSONAL FACTORS: Fitness, Past/current experiences, and Time since onset of injury/illness/exacerbation are also affecting patient's functional outcome.   REHAB POTENTIAL: Good  CLINICAL DECISION MAKING: Stable/uncomplicated  EVALUATION COMPLEXITY: Low   GOALS: Goals reviewed with patient? Yes  SHORT TERM GOALS: Target date: 01/20/2023  Patient will be I with initial HEP in order to progress with therapy. Baseline: HEP provided at eval Goal status: INITIAL  2.  Patient will report back pain </= 5/10 with work related tasks such as standing for extended periods in order to reduce functional limitations Baseline: 7-8/10 Goal status: INITIAL  3.  Patient will demonstrate  lumbar AROM WFL and without an increase in pain in order to improve ability to perform household tasks Baseline: limitation with lumbar flexion and report of pain with all lumbar movement Goal status: INITIAL  LONG TERM GOALS: Target date: 02/17/2023  Patient will be I with final HEP to maintain progress from PT. Baseline: HEP provided at eval Goal status: INITIAL  2.  Patient will report an improvement in her baseline FOTO score in order to indicate an improvement in her functional ability Baseline: 70% functional status Goal status: INITIAL  3.  Patient will demonstrate gross hip strength >/= 4/5 MMT in order to improve her lifting and standing tolerance Baseline: hip strength grossly 4-/5 MMT Goal status: INITIAL  4.  Patient will report </= 2/10 back pain with all work related tasks in order to reduce her functional limitations Baseline: 7-8/10 at eval Goal status: INITIAL   PLAN: PT FREQUENCY: 1x/week  PT DURATION: 8 weeks  PLANNED INTERVENTIONS: Therapeutic  exercises, Therapeutic activity, Neuromuscular re-education, Balance training, Gait training, Patient/Family education, Self Care, Joint mobilization, Joint manipulation, Aquatic Therapy, Dry Needling, Spinal manipulation, Spinal mobilization, Cryotherapy, Moist heat, Manual therapy, and Re-evaluation.  PLAN FOR NEXT SESSION: Review HEP and progress PRN, manual/TPDN for lumbar paraspinals, progress core stabilization and hip strengthening, progress to standing exercises and lifting progression   Eloy End PT  01/14/23 4:09 PM

## 2023-01-15 ENCOUNTER — Ambulatory Visit: Payer: Medicaid Other | Admitting: Student

## 2023-01-18 ENCOUNTER — Telehealth: Payer: Self-pay

## 2023-01-18 NOTE — Telephone Encounter (Signed)
Pharmacy Patient Advocate Encounter   Received notification from CoverMyMeds that prior authorization for [facility] is required/requested.  PA required; PA submitted to [facility] via CoverMyMeds Key/confirmation #/EOC [id number]. Status is pending

## 2023-01-21 ENCOUNTER — Ambulatory Visit: Payer: Medicaid Other | Attending: Family Medicine

## 2023-01-21 DIAGNOSIS — M6281 Muscle weakness (generalized): Secondary | ICD-10-CM

## 2023-01-21 DIAGNOSIS — M5459 Other low back pain: Secondary | ICD-10-CM

## 2023-01-21 NOTE — Telephone Encounter (Signed)
Pharmacy Patient Advocate Encounter  Received notification from [facility] that Prior Authorization for [facility] has been DENIED.  Full denial letter will be uploaded to the media tab. See denial reason below.  Reason: We may be able to approve this drug in certain situations (when you have tried a certain other drug first and it did not work [metformin or a metformin containing product]; when you cannot take a certain other drug [a contraindication or an adverse event to metformin]; when you have a certain other illness [established atherosclerotic cardiovascular disease (ASCVD) or chronic kidney disease (CKD)]; or when you are at risk for a certain illness [considered high-risk for atherosclerotic cardiovascular disease (ASCVD) with age greater than or equal to 55 years with at least two additional risk factors (smoking, obesity, hypertension, dyslipidemia, or albuminuria)]). We do not see that this applies to you. If this applies to you, we may need more information (if you have tried two certain other drugs first; records that you got better on the requested drug; if certain goals have been met; if you are getting closer to meeting certain goals).

## 2023-01-21 NOTE — Therapy (Signed)
OUTPATIENT PHYSICAL THERAPY TREATMENT/DISCHARGE  PHYSICAL THERAPY DISCHARGE SUMMARY  Visits from Start of Care: 5  Current functional level related to goals / functional outcomes: See goals and objective   Remaining deficits: See goals and objective   Education / Equipment: HEP   Patient agrees to discharge. Patient goals were met. Patient is being discharged due to meeting the stated rehab goals.   Patient Name: Julie Hendrix MRN: [id number] DOB:1985/09/22, 37 y.o., female Today's Date: 01/21/2023   END OF SESSION:  PT End of Session - 01/21/23 1532     Visit Number 5    Number of Visits 9    Date for PT Re-Evaluation 02/17/23    Authorization Type [employer]    PT Start Time 1530    PT Stop Time 1600    PT Time Calculation (min) 30 min    Activity Tolerance Patient tolerated treatment well    Behavior During Therapy [facility] for tasks assessed/performed                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 Started 12/2018, trying to quit.    Past Surgical History:  Procedure Laterality Date   COLPOSCOPY     CRYOTHERAPY  06/08/2017   cervix   Patient Active Problem List   Diagnosis Date Noted   Diabetes mellitus without complication (HCC) 01/08/2023   Episodic tension-type headache, not intractable 12/25/2022   Close exposure to COVID-19 virus 12/17/2022   Palpitation 11/27/2022   Bilateral lower extremity edema 11/27/2022   Cubital tunnel syndrome of both upper extremities 08/11/2022   Heavy menses 08/11/2022   Chest discomfort 10/31/2020   Back pain 09/06/2019   Gastroesophageal reflux disease 06/06/2019   Prediabetes 06/17/2018   Essential hypertension 05/18/2018   BMI  40.0-44.9, adult (HCC) 11/30/2017   PCOS (polycystic ovarian syndrome) 11/30/2017   Low grade squamous intraepithelial lesion on cytologic smear of cervix (LGSIL) 05/21/2017    PCP: Alicia Amel, MD  REFERRING PROVIDER: Doreene Eland, MD  REFERRING DIAG: Chronic bilateral low back pain without sciatica  Rationale for Evaluation and Treatment: Rehabilitation  THERAPY DIAG:  Other low back pain  Muscle weakness (generalized)  ONSET DATE: Ongoing for years   SUBJECTIVE:  SUBJECTIVE STATEMENT: Pt presents to PT with reports of continued decrease in LBP. Has been compliant with HEP. Feels like she is in a good place and is ready to discharge at this time.  PERTINENT HISTORY:  See PMH above  PAIN:  Are you having pain? Yes:  NPRS scale: 3/10 (7-8/10 with standing at work) Pain location: Lower and upper back Pain description: Achy, tight Aggravating factors: Standing extended periods, activity Relieving factors: Massages, straightening up her back (sitting upright)  PRECAUTIONS: None  RED FLAGS: None   WEIGHT BEARING RESTRICTIONS: No  FALLS:  Has patient fallen in last 6 months? No  OCCUPATION: [employer]  PLOF: Independent  PATIENT GOALS: Pain relief with work   OBJECTIVE:  PATIENT SURVEYS:  FOTO 70% functional status (predicted 69%) 01/21/2023: 94% function  COGNITION: Overall cognitive status: Within functional limits for tasks assessed     SENSATION: WFL  MUSCLE LENGTH: Hamstring WFL  POSTURE:   Rounded shoulders, increased lumbar lordosis  PALPATION: Tender to palpation with increased muscle tension bilateral lumbar and thoracic paraspinals  LUMBAR ROM:   AROM eval  Flexion 75%  Extension WFL  Right lateral flexion WFL  Left lateral flexion WFL  Right  rotation WFL  Left rotation WFL   (Blank rows = not tested)  Patient reports low back tightness with all movement  LOWER EXTREMITY ROM:      LE ROM grossly WFL  LOWER EXTREMITY MMT:    MMT Right eval Left eval Right 01/21/23 Left 01/21/23  Hip flexion 4 4    Hip extension 4- 4- 4/5 4/5  Hip abduction 4- 4- 4/5 4/5  Hip adduction      Hip internal rotation      Hip external rotation      Knee flexion 5 5    Knee extension 5 5    Ankle dorsiflexion      Ankle plantarflexion      Ankle inversion      Ankle eversion       (Blank rows = not tested)  LUMBAR SPECIAL TESTS:  Radicular testing negative  FUNCTIONAL TESTS:  Not assessed  GAIT: Assistive device utilized: None Level of assistance: Complete Independence Comments: WFL   TODAY'S TREATMENT:   [facility] Adult PT Treatment:                                                DATE: 01/21/2023 Therapeutic Exercise: NuStep lvl 5 UE/LE while taking subjective LTR 5 x 5 sec Supine PPT x 10 - 3" hold  Supine clamshell 2x15 back band Bridge 2x10 with black band 90/90 hold 3x15"  Seated fig 4 stretch 2x30" each Therapeutic Activity: Assessment of tests/measures, goals, and outcomes for discharge  [facility] Adult PT Treatment:                                                DATE: 01/14/2023 Therapeutic Exercise: NuStep lvl 5 UE/LE while taking subjective LTR 5 x 5 sec Supine PPT x 10 - 3" hold  Supine clamshell 2x15 back band Bridge 2x10 with black band Supine pilates SLR 2x10 each 90/90 hold 3x15"  Seated fig 4 stretch 2x30" each Pallof press 2x10 10# Lateral walk RTB x 3  laps at counter Standing hip abd/ext 2x10 RTB Wall squat x 10   [facility] Adult PT Treatment:                                                DATE: 01/07/2023 Therapeutic Exercise: NuStep lvl 5 UE/LE while taking subjective LTR 10 x 5 sec Supine PPT x 10 - 3" hold  Supine PPT with ball 2x10 Supine clamshell 2x15 blue band Bridge 3x10 Supine pilates SLR 2x10  each Seated fig 4 stretch 2x30" each Pallof press 2x10 10# Lateral walk RTB x 3 laps at counter  [facility] Adult PT Treatment:                                                DATE: 12/28/2022 Therapeutic Exercise: NuStep lvl 5 UE/LE while taking subjective LTR 10 x 5 sec Supine fig 4 stretch x 30" Bridge 2x10 - 3 sec Supine clamshell 3x15 GTB Supine piriformis stretch x 30" each Supine PPT 2x10 - 3" hold Supine PPT with ball 2x10 - 3" hold Row 2x10 17# Pallof press 2x10 10# Lateral walk RTB x 3 laps at counter  [facility] Adult PT Treatment:                                                DATE: 12/23/2022 Therapeutic Exercise: LTR 5 x 5 sec Bridge 10 x 3 sec Side clamshell with red x 10 each Row with blue x 10  PATIENT EDUCATION:  Education details: Exam findings, POC, HEP Person educated: Patient Education method: Explanation, Demonstration, Tactile cues, Verbal cues, and Handouts Education comprehension: verbalized understanding, returned demonstration, verbal cues required, tactile cues required, and needs further education  HOME EXERCISE PROGRAM: Access Code: [email or web address] URL: [email or web address] Date: 01/21/2023 Prepared by: Edwinna Areola  Exercises - Supine Posterior Pelvic Tilt  - 3-4 x weekly - 2 sets - 10 reps - 5 sec hold - Hooklying Isometric Clamshell  - 3-4 x weekly - 3 sets - 15 reps - black band hold - Supine Bridge with Resistance Band  - 3-4 x weekly - 3 sets - 10 reps - black band hold - Supine Lower Trunk Rotation  - 10 reps - 5 seconds hold - Supine 90/90 Abdominal Bracing  - 3 sets - 15 sec hold - Seated Figure 4 Piriformis Stretch  - 3-4 x weekly - 2 reps - 30 sec hold  ASSESSMENT:  CLINICAL IMPRESSION: Pt was able to complete all prescribed exercises and demonstrated knowledge of HEP with no adverse effect. Over the course of PT treatment she has progressed very well, improving core and hip strength and greatly increasing subjective functional  improvement. She should continue to improve with HEP compliance, will continue to progress as able.   (EVAL): Patient is a 37 y.o. female who was seen today for physical therapy evaluation and treatment for chronic back pain. Evaluation limited due to patient arriving late. She demonstrates limitation and report of tightness with lumbar motion, gross strength deficits of her core and hips, postural deficits, and reduced tolerance for work related activities such as standing  that are impacting her functional ability.    OBJECTIVE IMPAIRMENTS: decreased activity tolerance, decreased ROM, decreased strength, postural dysfunction, and pain.   ACTIVITY LIMITATIONS: lifting, bending, standing, and locomotion level  PARTICIPATION LIMITATIONS: cleaning, community activity, and occupation  PERSONAL FACTORS: Fitness, Past/current experiences, and Time since onset of injury/illness/exacerbation are also affecting patient's functional outcome.   GOALS: Goals reviewed with patient? Yes  SHORT TERM GOALS: Target date: 01/20/2023  Patient will be I with initial HEP in order to progress with therapy. Baseline: HEP provided at eval Goal status: MET  2.  Patient will report back pain </= 5/10 with work related tasks such as standing for extended periods in order to reduce functional limitations Baseline: 7-8/10 Goal status: MET  3.  Patient will demonstrate lumbar AROM WFL and without an increase in pain in order to improve ability to perform household tasks Baseline: limitation with lumbar flexion and report of pain with all lumbar movement Goal status: MET  LONG TERM GOALS: Target date: 02/17/2023  Patient will be I with final HEP to maintain progress from PT. Baseline: HEP provided at eval Goal status: MET  2.  Patient will report an improvement in her baseline FOTO score in order to indicate an improvement in her functional ability Baseline: 70% functional status 01/21/2023: 94% function Goal  status: MET  3.  Patient will demonstrate gross hip strength >/= 4/5 MMT in order to improve her lifting and standing tolerance Baseline: hip strength grossly 4-/5 MMT Goal status: MET  4.  Patient will report </= 2/10 back pain with all work related tasks in order to reduce her functional limitations Baseline: 7-8/10 at eval Goal status: MET   PLAN: PT FREQUENCY: 1x/week  PT DURATION: 8 weeks  PLANNED INTERVENTIONS: Therapeutic exercises, Therapeutic activity, Neuromuscular re-education, Balance training, Gait training, Patient/Family education, Self Care, Joint mobilization, Joint manipulation, Aquatic Therapy, Dry Needling, Spinal manipulation, Spinal mobilization, Cryotherapy, Moist heat, Manual therapy, and Re-evaluation.  PLAN FOR NEXT SESSION: Review HEP and progress PRN, manual/TPDN for lumbar paraspinals, progress core stabilization and hip strengthening, progress to standing exercises and lifting progression   Eloy End PT  01/21/23 4:19 PM

## 2023-02-06 ENCOUNTER — Ambulatory Visit (HOSPITAL_COMMUNITY)
Admission: EM | Admit: 2023-02-06 | Discharge: 2023-02-06 | Disposition: A | Discharge status: Home / Self Care | Payer: Medicaid Other | Attending: Internal Medicine | Admitting: Internal Medicine

## 2023-02-06 ENCOUNTER — Encounter (HOSPITAL_COMMUNITY): Payer: Self-pay

## 2023-02-06 DIAGNOSIS — M546 Pain in thoracic spine: Secondary | ICD-10-CM | POA: Diagnosis not present

## 2023-02-06 LAB — POCT URINALYSIS DIP (MANUAL ENTRY)
Bilirubin, UA: NEGATIVE
Glucose, UA: NEGATIVE mg/dL
Ketones, POC UA: NEGATIVE mg/dL
Leukocytes, UA: NEGATIVE
Nitrite, UA: NEGATIVE
Protein Ur, POC: NEGATIVE mg/dL
Spec Grav, UA: 1.015 (ref 1.010–1.025)
Urobilinogen, UA: 0.2 U/dL
pH, UA: 6 (ref 5.0–8.0)

## 2023-02-06 MED ORDER — KETOROLAC TROMETHAMINE 30 MG/ML IJ SOLN
INTRAMUSCULAR | Status: AC
  Filled 2023-02-06: qty 1

## 2023-02-06 MED ORDER — KETOROLAC TROMETHAMINE 30 MG/ML IJ SOLN
30.0000 mg | Freq: Once | INTRAMUSCULAR | Status: AC
  Administered 2023-02-06: 30 mg via INTRAMUSCULAR

## 2023-02-06 NOTE — ED Provider Notes (Signed)
[facility]    CSN: [id number] Arrival date & time: 02/06/23  1018      History   Chief Complaint Chief Complaint  Patient presents with   Back Pain    HPI Julie Hendrix is a 37 y.o. female.    Back Pain Associated symptoms: abdominal pain   Associated symptoms: no chest pain, no dysuria and no fever   Right-sided back pain onset last p.m. while at work.  She works at the [employer], admits a lot of lifting but denies known injury.  Pain is right lower thoracic, pain is sharp and dull worse with certain movements, and lying on right side.  Admits some radiation of pain to right abdomen.  No over-the-counter remedies tried. Admits occasional cough, she is a smoker, admits occasional sputum. Denies fever, chills, sweats, nausea, vomiting, diarrhea, urinary symptoms, rash or skin changes, paresthesias, travel, lower extremity pain or swelling, estrogen use.  Denies history of kidney stones.  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 Started 12/2018, trying to quit.     Patient Active Problem List   Diagnosis Date Noted   Diabetes mellitus without complication (HCC) 01/08/2023   Episodic tension-type headache, not intractable 12/25/2022   Close exposure to COVID-19 virus 12/17/2022   Palpitation 11/27/2022   Bilateral lower extremity edema 11/27/2022   Cubital tunnel syndrome of both upper extremities 08/11/2022   Heavy menses 08/11/2022   Chest discomfort 10/31/2020   Back pain 09/06/2019   Gastroesophageal reflux disease 06/06/2019   Prediabetes 06/17/2018   Essential hypertension 05/18/2018   BMI 40.0-44.9, adult (HCC) 11/30/2017   PCOS (polycystic ovarian syndrome) 11/30/2017   Low grade  squamous intraepithelial lesion on cytologic smear of cervix (LGSIL) 05/21/2017    Past Surgical History:  Procedure Laterality Date   COLPOSCOPY     CRYOTHERAPY  06/08/2017   cervix    OB History     Gravida  3   Para  2   Term  2   Preterm  0   AB  1   Living  2      SAB  0   IAB  1   Ectopic  0   Multiple  0   Live Births  2            Home Medications    Prior to Admission medications   Medication Sig Start Date End Date Taking? Authorizing Provider  amLODipine (NORVASC) 10 MG tablet Take 1 tablet (10 mg total) by mouth daily. 11/25/22   Alicia Amel, MD  hydrochlorothiazide (HYDRODIURIL) 25 MG tablet Take 1 tablet (25 mg total) by mouth daily. 01/08/23   Alicia Amel, MD  megestrol (MEGACE) 40 MG tablet Take 2 tablets by mouth daily. Can increase to 2 tablets twice daily in the event of heavy bleeding. 11/24/22   Adam Phenix, MD  metroNIDAZOLE (METROGEL) 0.75 % vaginal gel Place 1 Applicatorful vaginally 2 (two) times daily as needed (vaginal irritation). 12/09/22   Alicia Amel, MD  psyllium (METAMUCIL SMOOTH TEXTURE) 58.6 % powder Take 1 packet by mouth 3 (three) times daily. 08/11/22   Sabino Dick, DO  Semaglutide,0.25 or 0.5MG /DOS, 2 MG/1.5ML SOPN Inject  0.25 mg into the skin once a week. 0.25 mg once weekly for 4 weeks then increase to 0.5 mg weekly for at least 4 weeks,max 1 mg 01/08/23   Alicia Amel, MD  tranexamic acid (LYSTEDA) 650 MG TABS tablet Take 2 tablets (1,300 mg total) by mouth 3 (three) times daily. Take during menses for a maximum of five days 12/30/22   Lorriane Shire, MD    Family History Family History  Problem Relation Age of Onset   Hypertension Father    Diabetes Father    Heart attack Father    Hypertension Mother     Social History Social History   Tobacco Use   Smoking status: Every Day    Current packs/day: 0.00    Types: Cigarettes, Cigars    Last attempt to quit: 05/21/2020    Years  since quitting: 2.7   Smokeless tobacco: Never   Tobacco comments:    black and mild  Vaping Use   Vaping status: Never Used  Substance Use Topics   Alcohol use: Yes    Alcohol/week: 9.0 standard drinks of alcohol    Types: 3 Glasses of wine, 3 Cans of beer, 3 Shots of liquor per week   Drug use: No     Allergies   Patient has no known allergies.   Review of Systems Review of Systems  Constitutional:  Negative for appetite change, chills, fatigue and fever.  HENT:  Negative for congestion, rhinorrhea and sore throat.   Respiratory:  Positive for choking. Negative for chest tightness, shortness of breath and wheezing.   Cardiovascular:  Negative for chest pain.  Gastrointestinal:  Positive for abdominal pain. Negative for blood in stool, constipation, diarrhea, nausea and vomiting.  Genitourinary:  Positive for vaginal bleeding (LMP now). Negative for dysuria, frequency, hematuria, urgency and vaginal discharge.  Musculoskeletal:  Positive for back pain.     Physical Exam Triage Vital Signs ED Triage Vitals [02/06/23 1034]  Encounter Vitals Group     BP (!) 142/93     Systolic BP Percentile      Diastolic BP Percentile      Pulse Rate 88     Resp 16     Temp 98 F (36.7 C)     Temp Source Oral     SpO2 96 %     Weight      Height      Head Circumference      Peak Flow      Pain Score 8     Pain Loc      Pain Education      Exclude from Growth Chart    No data found.  Updated Vital Signs BP (!) 142/93 (BP Location: Left Arm)   Pulse 88   Temp 98 F (36.7 C) (Oral)   Resp 16   LMP  (LMP Unknown)   SpO2 96%   Visual Acuity Right Eye Distance:   Left Eye Distance:   Bilateral Distance:    Right Eye Near:   Left Eye Near:    Bilateral Near:     Physical Exam Vitals and nursing note reviewed.  Constitutional:      Appearance: She is not ill-appearing.  HENT:     Head: Normocephalic and atraumatic.     Right Ear: Tympanic membrane and ear canal  normal.     Left Ear: Tympanic membrane and ear canal normal.  Eyes:     Conjunctiva/sclera: Conjunctivae normal.  Cardiovascular:  Rate and Rhythm: Normal rate and regular rhythm.     Heart sounds: Normal heart sounds.  Pulmonary:     Effort: Pulmonary effort is normal.     Breath sounds: Normal breath sounds. No wheezing or rales.  Abdominal:     General: Bowel sounds are normal.     Palpations: Abdomen is soft.     Tenderness: There is no abdominal tenderness. There is no right CVA tenderness, left CVA tenderness or guarding.  Musculoskeletal:     Thoracic back: Tenderness present. No bony tenderness.     Lumbar back: No bony tenderness.       Back:  Skin:    General: Skin is warm and dry.  Neurological:     Mental Status: She is alert.  Psychiatric:        Mood and Affect: Mood normal.      UC Treatments / Results  Labs (all labs ordered are listed, but only abnormal results are displayed) Labs Reviewed - No data to display  EKG   Radiology No results found.  Procedures Procedures (including critical care time)  Medications Ordered in UC Medications - No data to display  Initial Impression / Assessment and Plan / UC Course  I have reviewed the triage vital signs and the nursing notes.  Pertinent labs & imaging results that were available during my care of the patient were reviewed by me and considered in my medical decision making (see chart for details).     37 year old female with right lower thoracic pain onset last p.m., has mild local tenderness.  Reports cough ,lungs are clear to auscultation.No urinary symptoms. POC urine with small blood, she is menstruating, pain no hx kidney stones, description of pain no consistent with kidney stone, ED precautions reviewed.   Final diagnoses:  None   Discharge Instructions   None    ED Prescriptions   None    PDMP not reviewed this encounter.   Meliton Rattan, [place] 02/06/23 1115

## 2023-02-06 NOTE — Discharge Instructions (Addendum)
Counter ibuprofen 3 tablets every 6 hours with food as needed for pain. Follow-up with your doctor if symptoms fail to improve.  Go to the emergency department for new, worsening symptoms or concerns

## 2023-02-06 NOTE — ED Triage Notes (Signed)
Pt presents to urgent care today with lower back pain that began last night at 10 PM. The pain has began to radiate to her right side, across her abdomen per pt description. Pt has not taken any medication for her pain.

## 2023-02-08 ENCOUNTER — Other Ambulatory Visit: Payer: Self-pay

## 2023-02-08 ENCOUNTER — Emergency Department (HOSPITAL_BASED_OUTPATIENT_CLINIC_OR_DEPARTMENT_OTHER): Payer: Medicaid Other

## 2023-02-08 ENCOUNTER — Emergency Department (HOSPITAL_BASED_OUTPATIENT_CLINIC_OR_DEPARTMENT_OTHER)
Admission: EM | Admit: 2023-02-08 | Discharge: 2023-02-08 | Disposition: A | Discharge status: Home / Self Care | Payer: Medicaid Other | Attending: Emergency Medicine | Admitting: Emergency Medicine

## 2023-02-08 DIAGNOSIS — R1032 Left lower quadrant pain: Secondary | ICD-10-CM | POA: Insufficient documentation

## 2023-02-08 DIAGNOSIS — R1012 Left upper quadrant pain: Secondary | ICD-10-CM

## 2023-02-08 DIAGNOSIS — R109 Unspecified abdominal pain: Secondary | ICD-10-CM | POA: Diagnosis not present

## 2023-02-08 LAB — URINALYSIS, ROUTINE W REFLEX MICROSCOPIC
Bacteria, UA: NONE SEEN
Bilirubin Urine: NEGATIVE
Glucose, UA: NEGATIVE mg/dL
Ketones, ur: NEGATIVE mg/dL
Leukocytes,Ua: NEGATIVE
Nitrite: NEGATIVE
Protein, ur: NEGATIVE mg/dL
Specific Gravity, Urine: 1.019 (ref 1.005–1.030)
pH: 6.5 (ref 5.0–8.0)

## 2023-02-08 LAB — COMPREHENSIVE METABOLIC PANEL
ALT: 19 U/L (ref 0–44)
AST: 16 U/L (ref 15–41)
Albumin: 4.8 g/dL (ref 3.5–5.0)
Alkaline Phosphatase: 47 U/L (ref 38–126)
Anion gap: 7 (ref 5–15)
BUN: 14 mg/dL (ref 6–20)
CO2: 26 mmol/L (ref 22–32)
Calcium: 10.2 mg/dL (ref 8.9–10.3)
Chloride: 104 mmol/L (ref 98–111)
Creatinine, Ser: 0.64 mg/dL (ref 0.44–1.00)
GFR, Estimated: >60 mL/min (ref >60)
Glucose, Bld: 75 mg/dL (ref 70–99)
Potassium: 3.5 mmol/L (ref 3.5–5.1)
Sodium: 137 mmol/L (ref 135–145)
Total Bilirubin: 0.4 mg/dL (ref 0.3–1.2)
Total Protein: 8.3 g/dL — ABNORMAL HIGH (ref 6.5–8.1)

## 2023-02-08 LAB — CBC WITH DIFFERENTIAL/PLATELET
Abs Immature Granulocytes: 0.05 10*3/uL (ref 0.00–0.07)
Basophils Absolute: 0.1 10*3/uL (ref 0.0–0.1)
Basophils Relative: 1 %
Eosinophils Absolute: 0.2 10*3/uL (ref 0.0–0.5)
Eosinophils Relative: 1 %
HCT: 45.3 % (ref 36.0–46.0)
Hemoglobin: 15 g/dL (ref 12.0–15.0)
Immature Granulocytes: 0 %
Lymphocytes Relative: 31 %
Lymphs Abs: 4.5 10*3/uL — ABNORMAL HIGH (ref 0.7–4.0)
MCH: 28.7 pg (ref 26.0–34.0)
MCHC: 33.1 g/dL (ref 30.0–36.0)
MCV: 86.8 fL (ref 80.0–100.0)
Monocytes Absolute: 1.2 10*3/uL — ABNORMAL HIGH (ref 0.1–1.0)
Monocytes Relative: 9 %
Neutro Abs: 8.2 10*3/uL — ABNORMAL HIGH (ref 1.7–7.7)
Neutrophils Relative %: 58 %
Platelets: 289 10*3/uL (ref 150–400)
RBC: 5.22 MIL/uL — ABNORMAL HIGH (ref 3.87–5.11)
RDW: 14.4 % (ref 11.5–15.5)
WBC: 14.2 10*3/uL — ABNORMAL HIGH (ref 4.0–10.5)
nRBC: 0 % (ref 0.0–0.2)

## 2023-02-08 LAB — PREGNANCY, URINE: Preg Test, Ur: NEGATIVE

## 2023-02-08 LAB — LIPASE, BLOOD: Lipase: 22 U/L (ref 11–51)

## 2023-02-08 MED ORDER — TRAMADOL HCL 50 MG PO TABS: 50.0000 mg | ORAL_TABLET | Freq: Four times a day (QID) | ORAL | 0 refills | Status: DC | PRN

## 2023-02-08 NOTE — ED Triage Notes (Signed)
Patient presents to ED c/o lower back pain with LLQ abdominal pain that started yesterday. Patient reports she was seen yesterday at [facility] for her back pain and was given a shot without relief. Patient reports frequent urination. Patient is in NAD, airway intact, nonlabored and even respirations.

## 2023-02-08 NOTE — Discharge Instructions (Addendum)
Take tramadol as prescribed as needed for pain.  Follow-up with primary doctor if not improving in the next few days, and return to the ER if you develop worsening pain, high fevers, bloody stools, or for other new and concerning symptoms.

## 2023-02-08 NOTE — ED Provider Notes (Signed)
[place] EMERGENCY DEPARTMENT AT [facility] Provider Note   CSN: [id number] Arrival date & time: 02/08/23  0422     History  Chief Complaint  Patient presents with   Abdominal Pain    Julie Hendrix is a 37 y.o. female.  Patient is a 37 year old female presenting with complaints of abdominal pain.  Patient describes pain to the left upper quadrant and back that has been constant since yesterday.  She denies any nausea, vomiting, or diarrhea.  She denies any bowel or bladder complaints.  She denies fevers or chills.  Patient was seen in urgent care yesterday for pain in her right flank.  The pain has now moved to the left side.  The history is provided by the patient.       Home Medications Prior to Admission medications   Medication Sig Start Date End Date Taking? Authorizing Provider  amLODipine (NORVASC) 10 MG tablet Take 1 tablet (10 mg total) by mouth daily. 11/25/22   Alicia Amel, MD  hydrochlorothiazide (HYDRODIURIL) 25 MG tablet Take 1 tablet (25 mg total) by mouth daily. 01/08/23   Alicia Amel, MD  megestrol (MEGACE) 40 MG tablet Take 2 tablets by mouth daily. Can increase to 2 tablets twice daily in the event of heavy bleeding. 11/24/22   Adam Phenix, MD  metroNIDAZOLE (METROGEL) 0.75 % vaginal gel Place 1 Applicatorful vaginally 2 (two) times daily as needed (vaginal irritation). 12/09/22   Alicia Amel, MD  psyllium (METAMUCIL SMOOTH TEXTURE) 58.6 % powder Take 1 packet by mouth 3 (three) times daily. 08/11/22   Sabino Dick, DO  Semaglutide,0.25 or 0.5MG /DOS, 2 MG/1.5ML SOPN Inject 0.25 mg into the skin once a week. 0.25 mg once weekly for 4 weeks then increase to 0.5 mg weekly for at least 4 weeks,max 1 mg 01/08/23   Alicia Amel, MD  tranexamic acid (LYSTEDA) 650 MG TABS tablet Take 2 tablets (1,300 mg total) by mouth 3 (three) times daily. Take during menses for a maximum of five days 12/30/22   Lorriane Shire, MD       Allergies    Patient has no known allergies.    Review of Systems   Review of Systems  All other systems reviewed and are negative.   Physical Exam Updated Vital Signs BP (!) 142/89   Pulse 80   Temp 98.2 F (36.8 C) (Oral)   Resp 17   Ht 5\' 3"  (1.6 m)   Wt 111.1 kg   LMP  (LMP Unknown)   SpO2 100%   BMI 43.40 kg/m  Physical Exam Vitals and nursing note reviewed.  Constitutional:      General: She is not in acute distress.    Appearance: She is well-developed. She is not diaphoretic.  HENT:     Head: Normocephalic and atraumatic.  Cardiovascular:     Rate and Rhythm: Normal rate and regular rhythm.     Heart sounds: No murmur heard.    No friction rub. No gallop.  Pulmonary:     Effort: Pulmonary effort is normal. No respiratory distress.     Breath sounds: Normal breath sounds. No wheezing.  Abdominal:     General: Bowel sounds are normal. There is no distension.     Palpations: Abdomen is soft.     Tenderness: There is abdominal tenderness in the left upper quadrant. There is no right CVA tenderness, left CVA tenderness, guarding or rebound.  Musculoskeletal:  General: Normal range of motion.     Cervical back: Normal range of motion and neck supple.  Skin:    General: Skin is warm and dry.  Neurological:     General: No focal deficit present.     Mental Status: She is alert and oriented to person, place, and time.     ED Results / Procedures / Treatments   Labs (all labs ordered are listed, but only abnormal results are displayed) Labs Reviewed  CBC WITH DIFFERENTIAL/PLATELET  COMPREHENSIVE METABOLIC PANEL  LIPASE, BLOOD  URINALYSIS, ROUTINE W REFLEX MICROSCOPIC  PREGNANCY, URINE    EKG None  Radiology No results found.  Procedures Procedures    Medications Ordered in ED Medications - No data to display  ED Course/ Medical Decision Making/ A&P  Patient is a 37 year old female presenting with complaints of left upper quadrant  pain, constant since yesterday.  This began in the absence of any injury or trauma.  Patient arrives here with stable vital signs and is afebrile.  There is some tenderness to the left upper quadrant, but no peritoneal signs.  Laboratory studies obtained including CBC, CMP, and lipase.  She has a mild elevation of her white count at 14,000, but laboratory studies are otherwise unremarkable.  Urinalysis is clear and pregnancy test is negative.  CT with renal protocol obtained showing no obvious intra-abdominal pathology.  At this point, the cause of her abdominal pain is unclear, but nothing appears emergent.  I will prescribe a small quantity of medication for pain and have patient follow-up if not improving.  Warning signs given for return.  Final Clinical Impression(s) / ED Diagnoses Final diagnoses:  None    Rx / DC Orders ED Discharge Orders     None         Geoffery Lyons, MD 02/08/23 [phone]

## 2023-02-10 ENCOUNTER — Other Ambulatory Visit: Payer: Self-pay | Admitting: Student

## 2023-02-10 DIAGNOSIS — I10 Essential (primary) hypertension: Secondary | ICD-10-CM

## 2023-02-10 DIAGNOSIS — G44219 Episodic tension-type headache, not intractable: Secondary | ICD-10-CM

## 2023-02-16 ENCOUNTER — Encounter: Payer: Self-pay | Admitting: Student

## 2023-02-16 ENCOUNTER — Ambulatory Visit (INDEPENDENT_AMBULATORY_CARE_PROVIDER_SITE_OTHER): Payer: Medicaid Other | Admitting: Student

## 2023-02-16 VITALS — BP 110/80 | HR 78 | Ht 63.0 in | Wt 244.8 lb

## 2023-02-16 DIAGNOSIS — R7309 Other abnormal glucose: Secondary | ICD-10-CM | POA: Diagnosis not present

## 2023-02-16 DIAGNOSIS — E119 Type 2 diabetes mellitus without complications: Secondary | ICD-10-CM

## 2023-02-16 DIAGNOSIS — I10 Essential (primary) hypertension: Secondary | ICD-10-CM

## 2023-02-16 DIAGNOSIS — Z6841 Body Mass Index (BMI) 40.0 and over, adult: Secondary | ICD-10-CM

## 2023-02-16 DIAGNOSIS — N939 Abnormal uterine and vaginal bleeding, unspecified: Secondary | ICD-10-CM

## 2023-02-16 MED ORDER — METFORMIN HCL ER 500 MG PO TB24: ORAL_TABLET | ORAL | 3 refills | Status: DC

## 2023-02-16 MED ORDER — MEGESTROL ACETATE 40 MG PO TABS
ORAL_TABLET | ORAL | Status: DC
Start: 2023-02-16 — End: 2023-03-29

## 2023-02-16 MED ORDER — AMLODIPINE-OLMESARTAN 10-20 MG PO TABS
1.0000 | ORAL_TABLET | Freq: Every day | ORAL | 2 refills | Status: DC
Start: 2023-02-16 — End: 2023-04-29

## 2023-02-16 MED ORDER — METRONIDAZOLE 0.75 % VA GEL: 1.0000 | VAGINAL | Status: DC

## 2023-02-16 NOTE — Patient Instructions (Addendum)
Congrats on the weight loss! You're doing a hard thing the hard way. Your BP looks good today. I am putting you on a combo BP med called amlodipine-olmesartan 10-20mg  daily. I want you to keep a log of morning and evening BP for two weeks and send me a photo on MyChart. We'll determine next steps based on that.   We are ordering metformin for your diabetes. Hopefully once we try this we can get Ozempic approved.    THINK ABOUT FLU SHOT AND PNEUMONIA SHOT.  Eliezer Mccoy, MD

## 2023-02-16 NOTE — Progress Notes (Signed)
    SUBJECTIVE:   CHIEF COMPLAINT / HPI:   Weight Loss Efforts Tells me that based on her home scale she had lost 12 pounds.  She gained back some weight after the [employer] homecoming festivities but is still feeling good about her overall trajectory.  She is doing quite well with the high-protein diet as this fits well with her preferred way of eating.  Overall she is feeling quite good.  Unfortunately, we tried to get her on a GLP-1 agent for both her weight and her elevated A1c but this was declined by her insurance.  Elevated A1c 6.5%.  She is aware that this is in the diabetic range.  As above, once not approved for GLP-1 therapy by her insurance and they have requested a trial of metformin first.  She is amenable to this. She has made significant lifestyle interventions as above.   AUB Primarily being managed by her OB/GYN.  She hadhopeful to preserve her fertility given desired pregnancy.  However, with her elevated A1c, she is no longer in a position to desire pregnancy.  She also had consider transition from Megace to TXA with her OB/GYN but ultimately decided against this and is requesting a refill of Megace today.  Recurrent BV On twice-weekly metrogel. Requests refill.    OBJECTIVE:   BP 110/80   Pulse 78   Ht 5\' 3"  (1.6 m)   Wt 244 lb 12.8 oz (111 kg)   LMP  (LMP Unknown)   SpO2 100%   BMI 43.36 kg/m   General: alert & oriented, no apparent distress, well groomed HEENT: normocephalic, atraumatic, EOM grossly intact, oral mucosa moist, neck supple Respiratory: normal respiratory effort GI: non-distended Skin: no rashes, no jaundice Psych: appropriate mood and affect   ASSESSMENT/PLAN:   BMI 40.0-44.9, adult (HCC) Congratulated on success with her weight loss.  Glad that this way of eating is working well for her.  Continue to encourage resistance training as part of her regimen.  Continuing current efforts.   Elevated hemoglobin A1c A1c of 6.5%.  Diabetic range.   Will go ahead and test her renal function with both a BMP and a urine albumin creatinine ratio. -BMP, UACR, lipid panel -Metformin XR, 500 mg daily with instructions to uptitrate to 2000 mg daily  Essential hypertension Excellent control today.  However, given elevated A1c, I do think she would benefit from being on an ARB.  Therefore we will transition her to amlodipine-olmesartan combo therapy in the hopes of both getting the renal protective benefit of the ARB and simplifying her regimen down to just 1 pill daily. -Stop amlodipine and hydrochlorothiazide -Start amlodipine-olmesartan 10-20 mg daily -Four week follow-up   Abnormal uterine bleeding -Refill of Megace today -Follow-up with OB/GYN    Recurrent BV -Refill MetroGel, continue twice weekly use  J Dorothyann Gibbs, MD [facility]

## 2023-02-17 LAB — LIPID PANEL
Chol/HDL Ratio: 3.8 ratio (ref 0.0–4.4)
Cholesterol, Total: 128 mg/dL (ref 100–199)
HDL: 34 mg/dL — ABNORMAL LOW (ref >39)
LDL Chol Calc (NIH): 72 mg/dL (ref 0–99)
Triglycerides: 120 mg/dL (ref 0–149)
VLDL Cholesterol Cal: 22 mg/dL (ref 5–40)

## 2023-02-17 LAB — BASIC METABOLIC PANEL
BUN/Creatinine Ratio: 13 (ref 9–23)
BUN: 8 mg/dL (ref 6–20)
CO2: 20 mmol/L (ref 20–29)
Calcium: 9.5 mg/dL (ref 8.7–10.2)
Chloride: 103 mmol/L (ref 96–106)
Creatinine, Ser: 0.64 mg/dL (ref 0.57–1.00)
Glucose: 88 mg/dL (ref 70–99)
Potassium: 4 mmol/L (ref 3.5–5.2)
Sodium: 138 mmol/L (ref 134–144)
eGFR: 116 mL/min/{1.73_m2} (ref >59)

## 2023-02-17 LAB — MICROALBUMIN / CREATININE URINE RATIO
Creatinine, Urine: 45.4 mg/dL
Microalb/Creat Ratio: <7 mg/g{creat} (ref 0–29)
Microalbumin, Urine: <3 ug/mL

## 2023-02-18 NOTE — Assessment & Plan Note (Signed)
A1c of 6.5%.  Diabetic range.  Will go ahead and test her renal function with both a BMP and a urine albumin creatinine ratio. -BMP, UACR, lipid panel -Metformin XR, 500 mg daily with instructions to uptitrate to 2000 mg daily

## 2023-02-18 NOTE — Assessment & Plan Note (Signed)
-  Refill of Megace today -Follow-up with OB/GYN

## 2023-02-18 NOTE — Assessment & Plan Note (Signed)
Congratulated on success with her weight loss.  Glad that this way of eating is working well for her.  Continue to encourage resistance training as part of her regimen.  Continuing current efforts.

## 2023-02-18 NOTE — Assessment & Plan Note (Addendum)
Excellent control today.  However, given elevated A1c, I do think she would benefit from being on an ARB.  Therefore we will transition her to amlodipine-olmesartan combo therapy in the hopes of both getting the renal protective benefit of the ARB and simplifying her regimen down to just 1 pill daily. -Stop amlodipine and hydrochlorothiazide -Start amlodipine-olmesartan 10-20 mg daily -Four week follow-up

## 2023-02-18 NOTE — Assessment & Plan Note (Signed)
>>  ASSESSMENT AND PLAN FOR ELEVATED HEMOGLOBIN A1C WRITTEN ON 02/18/2023 10:58 AM BY SANFORD, JAMES B, MD  A1c of 6.5%.  Diabetic range.  Will go ahead and test her renal function with both a BMP and a urine albumin creatinine ratio. -BMP, UACR, lipid panel -Metformin  XR, 500 mg daily with instructions to uptitrate to 2000 mg daily

## 2023-03-08 ENCOUNTER — Encounter (HOSPITAL_BASED_OUTPATIENT_CLINIC_OR_DEPARTMENT_OTHER): Payer: Medicaid Other | Admitting: Internal Medicine

## 2023-03-09 ENCOUNTER — Ambulatory Visit (HOSPITAL_BASED_OUTPATIENT_CLINIC_OR_DEPARTMENT_OTHER): Payer: Medicaid Other | Attending: Family Medicine | Admitting: Internal Medicine

## 2023-03-09 VITALS — Ht 63.0 in | Wt 242.0 lb

## 2023-03-09 DIAGNOSIS — G44219 Episodic tension-type headache, not intractable: Secondary | ICD-10-CM | POA: Diagnosis not present

## 2023-03-09 DIAGNOSIS — G4733 Obstructive sleep apnea (adult) (pediatric): Secondary | ICD-10-CM

## 2023-03-09 DIAGNOSIS — I10 Essential (primary) hypertension: Secondary | ICD-10-CM

## 2023-03-13 DIAGNOSIS — G44219 Episodic tension-type headache, not intractable: Secondary | ICD-10-CM

## 2023-03-13 DIAGNOSIS — I10 Essential (primary) hypertension: Secondary | ICD-10-CM

## 2023-03-13 NOTE — Procedures (Signed)
    Patient Name: Julie Hendrix, Julie Hendrix Date: 03/10/2023 Gender: Female D.O.B: Dec 03, 1985 Age (years): 36 Referring Provider: Pearlean Brownie Height (inches): 63 Interpreting Physician: Jetty Duhamel MD, ABSM Weight (lbs): 249 RPSGT: [place] [facility] BMI: 44 MRN: [id number] Neck Size: <br>  CLINICAL INFORMATION Sleep Study Type: HST Indication for sleep study: snoring Epworth Sleepiness Score: 8  SLEEP STUDY TECHNIQUE A multi-channel overnight portable sleep study was performed. The channels recorded were: nasal airflow, thoracic respiratory movement, and oxygen saturation with a pulse oximetry. Snoring was also monitored.  MEDICATIONS Patient self administered medications include: none reported.  SLEEP ARCHITECTURE Patient was studied for 406.5 minutes. The sleep efficiency was 100.0 % and the patient was supine for 0%. The arousal index was 0.0 per hour.  RESPIRATORY PARAMETERS The overall AHI was 9.2 per hour, with a central apnea index of 0 per hour. The oxygen nadir was 89% during sleep.  CARDIAC DATA Mean heart rate during sleep was 74.2 bpm.  IMPRESSIONS - Mild obstructive sleep apnea occurred during this study (AHI = 9.2/h). - Mild oxygen desaturation was noted during this study (Min O2 = 89%, Mean 93%). - Patient snored.  DIAGNOSIS - Obstructive Sleep Apnea (G47.33)  RECOMMENDATIONS - Treatment for mild OSA is guided by symptoms and co-morbidity. Conservative options may include observation, weight loss, chin strap, sleep tape and sleep position off back. Other options, including CPAP, a fitted oral appliance or ENT evaluation, would be based on clinical judgment. - Be careful with alcohol, sedatives and other CNS depressants that may worsen sleep apnea and disrupt normal sleep architecture. - Sleep hygiene should be reviewed to assess factors that may improve sleep quality. - Weight management and regular exercise should be initiated or  continued.  [Electronically signed] 03/13/2023 12:41 PM  Jetty Duhamel MD, ABSM Diplomate, American Board of Sleep Medicine NPI: [id number]                         Jetty Duhamel Diplomate, American Board of Sleep Medicine  ELECTRONICALLY SIGNED ON:  03/13/2023, 12:33 PM [facility] PH: [phone]   FX: [phone] ACCREDITED BY THE AMERICAN ACADEMY OF SLEEP MEDICINE

## 2023-03-23 ENCOUNTER — Other Ambulatory Visit: Payer: Self-pay | Admitting: Obstetrics & Gynecology

## 2023-03-23 DIAGNOSIS — N939 Abnormal uterine and vaginal bleeding, unspecified: Secondary | ICD-10-CM

## 2023-03-25 ENCOUNTER — Telehealth: Payer: Self-pay | Admitting: *Deleted

## 2023-03-25 DIAGNOSIS — N939 Abnormal uterine and vaginal bleeding, unspecified: Secondary | ICD-10-CM

## 2023-03-25 NOTE — Telephone Encounter (Signed)
Pt left message requesting refill of Megace.

## 2023-03-26 NOTE — Telephone Encounter (Signed)
Per chart review at last visit provider stopped megace and started Lysteda. I called patient and left a message we are returning her call and need to discuss with her and have questions for her , please call office back. Nancy Fetter

## 2023-03-29 MED ORDER — MEGESTROL ACETATE 40 MG PO TABS
ORAL_TABLET | ORAL | 3 refills | Status: DC
Start: 2023-03-29 — End: 2024-01-27

## 2023-03-29 NOTE — Telephone Encounter (Signed)
Called and spoke with patient. She would like to continue the Megace as she no longer desires to stay on Megace instead of Lysteda. Family Med MD did prescribe 1 month of Megace for patient.   She is bleeding the entire month. She is changing her pad 1-2 times a day.   Message routed to Dr. Briscoe Deutscher for advisement.   She would like to schedule a follow up appointment with Dr. Briscoe Deutscher. Message to front office to call and schedule.

## 2023-03-29 NOTE — Addendum Note (Signed)
Addended by: Ed Blalock on: 03/29/2023 04:33 PM   Modules accepted: Orders

## 2023-03-29 NOTE — Telephone Encounter (Signed)
Called patient and informed her Megace has been reordered and message has been sent to front office to call and schedule follow up with Dr. Briscoe Deutscher.

## 2023-04-04 ENCOUNTER — Emergency Department (HOSPITAL_COMMUNITY): Payer: Medicaid Other

## 2023-04-04 ENCOUNTER — Emergency Department (HOSPITAL_COMMUNITY)
Admission: EM | Admit: 2023-04-04 | Discharge: 2023-04-05 | Disposition: A | Discharge status: Home / Self Care | Payer: Medicaid Other | Attending: Emergency Medicine | Admitting: Emergency Medicine

## 2023-04-04 ENCOUNTER — Encounter (HOSPITAL_COMMUNITY): Payer: Self-pay | Admitting: *Deleted

## 2023-04-04 ENCOUNTER — Other Ambulatory Visit: Payer: Self-pay

## 2023-04-04 ENCOUNTER — Encounter (HOSPITAL_COMMUNITY): Payer: Self-pay | Admitting: Emergency Medicine

## 2023-04-04 ENCOUNTER — Ambulatory Visit (HOSPITAL_COMMUNITY)
Admission: EM | Admit: 2023-04-04 | Discharge: 2023-04-04 | Disposition: A | Discharge status: Home / Self Care | Payer: Medicaid Other | Attending: Internal Medicine | Admitting: Internal Medicine

## 2023-04-04 DIAGNOSIS — R072 Precordial pain: Secondary | ICD-10-CM | POA: Diagnosis not present

## 2023-04-04 DIAGNOSIS — F17200 Nicotine dependence, unspecified, uncomplicated: Secondary | ICD-10-CM | POA: Insufficient documentation

## 2023-04-04 DIAGNOSIS — I10 Essential (primary) hypertension: Secondary | ICD-10-CM | POA: Insufficient documentation

## 2023-04-04 DIAGNOSIS — M79602 Pain in left arm: Secondary | ICD-10-CM

## 2023-04-04 DIAGNOSIS — Z79899 Other long term (current) drug therapy: Secondary | ICD-10-CM | POA: Insufficient documentation

## 2023-04-04 DIAGNOSIS — M25512 Pain in left shoulder: Secondary | ICD-10-CM | POA: Diagnosis not present

## 2023-04-04 DIAGNOSIS — R079 Chest pain, unspecified: Secondary | ICD-10-CM

## 2023-04-04 DIAGNOSIS — M549 Dorsalgia, unspecified: Secondary | ICD-10-CM | POA: Insufficient documentation

## 2023-04-04 LAB — BASIC METABOLIC PANEL
Anion gap: 10 (ref 5–15)
BUN: 8 mg/dL (ref 6–20)
CO2: 21 mmol/L — ABNORMAL LOW (ref 22–32)
Calcium: 9.5 mg/dL (ref 8.9–10.3)
Chloride: 106 mmol/L (ref 98–111)
Creatinine, Ser: 0.63 mg/dL (ref 0.44–1.00)
GFR, Estimated: >60 mL/min (ref >60)
Glucose, Bld: 94 mg/dL (ref 70–99)
Potassium: 4 mmol/L (ref 3.5–5.1)
Sodium: 137 mmol/L (ref 135–145)

## 2023-04-04 LAB — CBC
HCT: 43.9 % (ref 36.0–46.0)
Hemoglobin: 14.5 g/dL (ref 12.0–15.0)
MCH: 28.8 pg (ref 26.0–34.0)
MCHC: 33 g/dL (ref 30.0–36.0)
MCV: 87.3 fL (ref 80.0–100.0)
Platelets: 287 10*3/uL (ref 150–400)
RBC: 5.03 MIL/uL (ref 3.87–5.11)
RDW: 13.9 % (ref 11.5–15.5)
WBC: 12.9 10*3/uL — ABNORMAL HIGH (ref 4.0–10.5)
nRBC: 0 % (ref 0.0–0.2)

## 2023-04-04 LAB — TROPONIN I (HIGH SENSITIVITY)
Troponin I (High Sensitivity): <2 ng/L (ref <18)
Troponin I (High Sensitivity): <2 ng/L (ref <18)

## 2023-04-04 LAB — HCG, SERUM, QUALITATIVE: Preg, Serum: NEGATIVE

## 2023-04-04 NOTE — ED Notes (Signed)
Patient is being discharged from the Urgent Care and sent to the Emergency Department via POV . Per [place] Garrison, NP, patient is in need of higher level of care due to Complaints of chest pain, limited resources. Patient is aware and verbalizes understanding of plan of care.  Vitals:   04/04/23 1800  BP: 113/77  Pulse: 72  Resp: 20  Temp: 98.2 F (36.8 C)  SpO2: 97%

## 2023-04-04 NOTE — ED Provider Notes (Signed)
[facility]    CSN: [id number] Arrival date & time: 04/04/23  1732      History   Chief Complaint Chief Complaint  Patient presents with   Back Pain   Chest Pain    HPI Julie Hendrix is a 37 y.o. female.   Patient presents to clinic for left-sided chest pain, left upper back pain and left arm pain.  Reports she will typically have intermittent chest and back pain, but when she woke up this morning her pains were much worse than usual.  Reports pain gets worse when she lays down.  She thought initially it was gas pains so she took some Gas-X and other over-the-counter treatments for gas and went back to bed.  She woke up around 5 PM and noticed that her pain was worse and it is started to radiate into her left arm.    The history is provided by the patient and medical records.  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 Started 12/2018, trying to quit.     Patient Active Problem List   Diagnosis Date Noted   Elevated hemoglobin A1c 01/08/2023   Episodic tension-type headache, not intractable 12/25/2022   Close exposure to COVID-19 virus 12/17/2022   Palpitation 11/27/2022   Bilateral lower extremity edema 11/27/2022   Cubital tunnel syndrome of both upper extremities 08/11/2022   Abnormal uterine bleeding 08/11/2022   Chest discomfort 10/31/2020   Back pain 09/06/2019   Gastroesophageal reflux disease 06/06/2019   Prediabetes 06/17/2018   Essential hypertension 05/18/2018   BMI 40.0-44.9, adult (HCC) 11/30/2017   PCOS (polycystic ovarian syndrome) 11/30/2017   Low grade 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 Authorizing Provider  amlodipine-olmesartan (AZOR) 10-20 MG tablet Take 1 tablet by mouth daily. 02/16/23   Alicia Amel, MD  megestrol (MEGACE) 40 MG tablet Take 2 tablets by mouth daily. Can increase to 2 tablets twice daily in the event of heavy bleeding. 03/29/23   Lorriane Shire, MD  metFORMIN (GLUCOPHAGE-XR) 500 MG 24 hr tablet Start with 500mg  daily, and then increase to 1000mg  daily after one week, followed by 1500mg  daily (in one dose or divided doses) after one week, then 2000mg  daily (in one dose or divided doses) 02/16/23   Alicia Amel, MD  metroNIDAZOLE (METROGEL) 0.75 % vaginal gel Place 1 Applicatorful vaginally 2 (two) times a week. Patient not taking: Reported on 04/04/2023 02/18/23   Alicia Amel, MD  psyllium (  METAMUCIL SMOOTH TEXTURE) 58.6 % powder Take 1 packet by mouth 3 (three) times daily. 08/11/22   Sabino Dick, DO  traMADol (ULTRAM) 50 MG tablet Take 1 tablet (50 mg total) by mouth every 6 (six) hours as needed. Patient not taking: Reported on 04/04/2023 02/08/23   Geoffery Lyons, MD    Family History Family History  Problem Relation Age of Onset   Hypertension Father    Diabetes Father    Heart attack Father    Hypertension Mother     Social History Social History   Tobacco Use   Smoking status: Every Day    Current packs/day: 0.00    Types: Cigarettes, Cigars    Last attempt to quit: 05/21/2020    Years since quitting: 2.8   Smokeless tobacco: Never   Tobacco comments:    black and mild  Vaping Use   Vaping status: Never Used  Substance Use Topics    Alcohol use: Yes    Alcohol/week: 9.0 standard drinks of alcohol    Types: 3 Glasses of wine, 3 Cans of beer, 3 Shots of liquor per week   Drug use: No     Allergies   Patient has no known allergies.   Review of Systems Review of Systems  Per HPI   Physical Exam Triage Vital Signs ED Triage Vitals  Encounter Vitals Group     BP 04/04/23 1800 113/77     Systolic BP Percentile --      Diastolic BP Percentile --      Pulse Rate 04/04/23 1800 72     Resp 04/04/23 1800 20     Temp 04/04/23 1800 98.2 F (36.8 C)     Temp Source 04/04/23 1800 Oral     SpO2 04/04/23 1800 97 %     Weight --      Height --      Head Circumference --      Peak Flow --      Pain Score 04/04/23 1758 8     Pain Loc --      Pain Education --      Exclude from Growth Chart --    No data found.  Updated Vital Signs BP 113/77 (BP Location: Left Arm) Comment (BP Location): large cuff  Pulse 72   Temp 98.2 F (36.8 C) (Oral)   Resp 20   SpO2 97%   Visual Acuity Right Eye Distance:   Left Eye Distance:   Bilateral Distance:    Right Eye Near:   Left Eye Near:    Bilateral Near:     Physical Exam Vitals and nursing note reviewed.  Constitutional:      Appearance: Normal appearance. She is well-developed. She is obese.  HENT:     Head: Normocephalic and atraumatic.     Right Ear: External ear normal.     Left Ear: External ear normal.     Nose: Nose normal.     Mouth/Throat:     Mouth: Mucous membranes are moist.  Eyes:     Conjunctiva/sclera: Conjunctivae normal.  Cardiovascular:     Rate and Rhythm: Normal rate and regular rhythm.     Heart sounds: Normal heart sounds. No murmur heard. Pulmonary:     Effort: Pulmonary effort is normal. No respiratory distress.     Breath sounds: Normal breath sounds.  Musculoskeletal:        General: Normal range of motion.  Skin:    General: Skin is warm and dry.  Neurological:     General: No focal deficit present.     Mental Status:  She is alert and oriented to person, place, and time.  Psychiatric:        Mood and Affect: Mood normal.        Behavior: Behavior normal.      UC Treatments / Results  Labs (all labs ordered are listed, but only abnormal results are displayed) Labs Reviewed - No data to display  EKG   Radiology No results found.  Procedures Procedures (including critical care time)  Medications Ordered in UC Medications - No data to display  Initial Impression / Assessment and Plan / UC Course  I have reviewed the triage vital signs and the nursing notes.  Pertinent labs & imaging results that were available during my care of the patient were reviewed by me and considered in my medical decision making (see chart for details).  Vitals and triage reviewed, patient is hemodynamically stable.  Chest and left subscapular pain that started this morning and have gotten worse upon wakening from sleep and now has associated left arm pain.  Has tried multiple acid reflux/gas medications without relief.  EKG shows normal sinus rhythm, without ST elevation or ST depression, normal rate and rhythm.  Lungs are vesicular.  Discussed due to worsening of pain, left arm involvement and back pain, as well as past medical history, patient would benefit from further evaluation at the nearest emergency department where they could check troponin and potential further imaging as indicated.  Patient verbalized understanding, will head to [facility], ED via POV.     Final Clinical Impressions(s) / UC Diagnoses   Final diagnoses:  None     Discharge Instructions      I am concerned that your chest pain has gotten worse and now you are having left arm pain.  Please go to the emergency department for further evaluation.     ED Prescriptions   None    PDMP not reviewed this encounter.   Kelden Lavallee, [place] N, [place] 04/04/23 [phone]

## 2023-04-04 NOTE — ED Triage Notes (Signed)
The pt is c/o chest and lt arm pain since last pm   [facility] and they sent her here because of the lt arm pain  lmp irregular

## 2023-04-04 NOTE — ED Notes (Signed)
Initial EKG , equipment read as ACUTE MI.  However, in the process of printing EKG, noted right lower limb lead had come off patient.    Replaced lead and reprinted EKG.  Read as Normal Sinus Rhythm at this time.  Both EKG's and explanation given to [place], NP.

## 2023-04-04 NOTE — ED Triage Notes (Addendum)
Reports left chest pain, left back and left arm pain since 3 am this morning.  Denies sob.  Has felt this way before, but not as bad as she feels today.   Patient thought reflux, history of gerd- pepcid this morning and gas-x this afternoon with no relief.    Skin warm and dry.

## 2023-04-04 NOTE — Discharge Instructions (Signed)
I am concerned that your chest pain has gotten worse and now you are having left arm pain.  Please go to the emergency department for further evaluation.

## 2023-04-05 NOTE — Discharge Instructions (Signed)

## 2023-04-05 NOTE — ED Provider Notes (Signed)
[place] EMERGENCY DEPARTMENT AT [facility] Provider Note   CSN: [id number] Arrival date & time: 04/04/23  1933     History  Chief Complaint  Patient presents with   Chest Pain    Julie Hendrix is a 37 y.o. female.  The history is provided by the patient.  Patient w/history of obesity and hypertension presents with chest pain.  Patient reports she was staying up all night as she works third shift and she ate food from cookout last night.  After waking up she started having left-sided chest pain and back pain.  There is also pain in her left shoulder.  No fevers or vomiting.  No shortness of breath.  She has had this pain previously but not this severe She thought it might be reflux but not responding to home medications. No previous history of CAD/CVA/PE She is a current smoker.  She is compliant with her BP meds The pain is described as sharp.  It is not pleuritic.  There is no ripping or tearing sensation.  No focal weakness is reported    Home Medications Prior to Admission medications   Medication Sig Start Date End Date Taking? Authorizing Provider  amlodipine-olmesartan (AZOR) 10-20 MG tablet Take 1 tablet by mouth daily. 02/16/23   Alicia Amel, MD  megestrol (MEGACE) 40 MG tablet Take 2 tablets by mouth daily. Can increase to 2 tablets twice daily in the event of heavy bleeding. 03/29/23   Lorriane Shire, MD  metFORMIN (GLUCOPHAGE-XR) 500 MG 24 hr tablet Start with 500mg  daily, and then increase to 1000mg  daily after one week, followed by 1500mg  daily (in one dose or divided doses) after one week, then 2000mg  daily (in one dose or divided doses) 02/16/23   Alicia Amel, MD  psyllium (METAMUCIL SMOOTH TEXTURE) 58.6 % powder Take 1 packet by mouth 3 (three) times daily. 08/11/22   Sabino Dick, DO      Allergies    Patient has no known allergies.    Review of Systems   Review of Systems  Constitutional:  Negative for fever.   Cardiovascular:  Positive for chest pain. Negative for leg swelling.  Neurological:  Negative for weakness.    Physical Exam Updated Vital Signs BP 111/71   Pulse 79   Temp 98.4 F (36.9 C)   Resp 18   Ht 1.6 m (5\' 3" )   Wt 109.8 kg   SpO2 100%   BMI 42.88 kg/m  Physical Exam CONSTITUTIONAL: Well developed/well nourished HEAD: Normocephalic/atraumatic EYES: EOMI ENMT: Mucous membranes moist NECK: supple no meningeal signs SPINE/BACK:entire spine nontender CV: S1/S2 noted, no murmurs/rubs/gallops noted LUNGS: Lungs are clear to auscultation bilaterally, no apparent distress ABDOMEN: soft, nontender NEURO: Pt is awake/alert/appropriate, moves all extremitiesx4.  No facial droop.  No arm or leg drift EXTREMITIES: pulses normal/equal, full ROM, no lower extremity edema or tenderness SKIN: warm, color normal PSYCH: no abnormalities of mood noted, alert and oriented to situation  ED Results / Procedures / Treatments   Labs (all labs ordered are listed, but only abnormal results are displayed) Labs Reviewed  BASIC METABOLIC PANEL - Abnormal; Notable for the following components:      Result Value   CO2 21 (*)    All other components within normal limits  CBC - Abnormal; Notable for the following components:   WBC 12.9 (*)    All other components within normal limits  HCG, SERUM, QUALITATIVE  TROPONIN I (HIGH SENSITIVITY)  TROPONIN  I (HIGH SENSITIVITY)    EKG EKG Interpretation Date/Time:  Sunday April 04 2023 23:56:52 EST Ventricular Rate:  74 PR Interval:  163 QRS Duration:  81 QT Interval:  379 QTC Calculation: 421 R Axis:   96  Text Interpretation: Sinus rhythm Borderline right axis deviation ST elev, probable normal early repol pattern No significant change since last tracing Confirmed by Zadie Rhine ([id number]) on 04/05/2023 12:09:03 AM  Radiology DG Chest 2 View Result Date: 04/04/2023 CLINICAL DATA:  Chest pain, left arm pain EXAM: CHEST - 2 VIEW  COMPARISON:  09/07/2022 FINDINGS: The heart size and mediastinal contours are within normal limits. Both lungs are clear. The visualized skeletal structures are unremarkable. IMPRESSION: No active cardiopulmonary disease. Electronically Signed   By: Jasmine Pang M.D.   On: 04/04/2023 20:33    Procedures Procedures    Medications Ordered in ED Medications - No data to display  ED Course/ Medical Decision Making/ A&P              HEART Score: 2                    Medical Decision Making Amount and/or Complexity of Data Reviewed ECG/medicine tests: ordered.   This patient presents to the ED for concern of chest pain, this involves an extensive number of treatment options, and is a complaint that carries with it a high risk of complications and morbidity.  The differential diagnosis includes but is not limited to acute coronary syndrome, aortic dissection, pulmonary embolism, pericarditis, pneumothorax, pneumonia, myocarditis, pleurisy, esophageal rupture   Comorbidities that complicate the patient evaluation: Patient's presentation is complicated by their history of hypertension  Social Determinants of Health: Patient's  tobacco use   increases the complexity of managing their presentation  Additional history obtained: Records reviewed  urgent care notes reviewed  Lab Tests: I Ordered, and personally interpreted labs.  The pertinent results include: Mild leukocytosis  Imaging Studies ordered: I ordered imaging studies including X-ray chest   I independently visualized and interpreted imaging which showed no acute findings I agree with the radiologist interpretation   Test Considered: Patient is low risk / negative by heart score, therefore do not feel that cardiac admission is indicated.   Reevaluation: After the interventions noted above, I reevaluated the patient and found that they have :improved  Complexity of problems addressed: Patient's presentation is most  consistent with  acute presentation with potential threat to life or bodily function  Disposition: After consideration of the diagnostic results and the patient's response to treatment,  I feel that the patent would benefit from discharge   .    Patient very well-appearing.  Vitals appropriate.  I have low suspicion for ACS/PE/dissection. We discussed strict return precautions       Final Clinical Impression(s) / ED Diagnoses Final diagnoses:  Precordial pain    Rx / DC Orders ED Discharge Orders     None         Zadie Rhine, MD 04/05/23 [phone]

## 2023-04-20 ENCOUNTER — Encounter: Payer: Self-pay | Admitting: Obstetrics and Gynecology

## 2023-04-20 ENCOUNTER — Other Ambulatory Visit: Payer: Self-pay

## 2023-04-20 ENCOUNTER — Ambulatory Visit: Payer: Medicaid Other | Admitting: Obstetrics and Gynecology

## 2023-04-20 VITALS — BP 133/82 | HR 91 | Wt 247.3 lb

## 2023-04-20 DIAGNOSIS — N939 Abnormal uterine and vaginal bleeding, unspecified: Secondary | ICD-10-CM

## 2023-04-20 DIAGNOSIS — Z1331 Encounter for screening for depression: Secondary | ICD-10-CM

## 2023-04-20 DIAGNOSIS — Z8742 Personal history of other diseases of the female genital tract: Secondary | ICD-10-CM

## 2023-04-20 DIAGNOSIS — L989 Disorder of the skin and subcutaneous tissue, unspecified: Secondary | ICD-10-CM

## 2023-04-20 NOTE — Progress Notes (Signed)
 GYNECOLOGY VISIT  Patient name: Julie Hendrix MRN [id number]  Date of birth: 07/01/1985 Chief Complaint:   Follow-up  History:  Julie Hendrix is a 37 y.o. [id number] being seen today for AUB followup.  Decided not to take TXA and has instead continued prn megace  for bleeding. Currently having spotting . Has not been taking it regularly and typically takes it as needed. Less focused on pregnancy goals after diagnosis with DM. Prescribed metformin  which she has not been taking regularly. Would be ok with getting pregnant but wondering if she should be on some sort of birth control. Current smoker. Not interested in depo due to weight concerns.   States has had a boil on inner thigh and buttocks. The one on buttocks resolved after warm bath and wondering if the same will happen to one on thigh. Denies fever, chills and abnormal discharge. Notes on thigh and not on vulva. Denies tunneling into the skin. Has had boils on skin before.    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 Started 12/2018, trying to quit.     Past Surgical History:  Procedure Laterality Date   COLPOSCOPY     CRYOTHERAPY  06/08/2017   cervix    The following portions of the patient's history were reviewed and updated as appropriate: allergies, current medications, past family history, past medical history, past social history, past surgical history and problem list.   Health Maintenance:   Last pap     Component Value Date/Time   DIAGPAP  06/24/2022 1623    - Negative for intraepithelial lesion or malignancy (NILM)   DIAGPAP  06/20/2019 0956    - Negative for intraepithelial lesion or malignancy (NILM)   DIAGPAP (A) 05/29/2016 0000     ATYPICAL SQUAMOUS CELLS OF UNDETERMINED SIGNIFICANCE (ASC-US ).   HPVHIGH Negative 06/24/2022 1623   HPVHIGH Negative 06/20/2019 0956   ADEQPAP  06/24/2022 1623    Satisfactory for evaluation; transformation zone component ABSENT.   ADEQPAP  06/20/2019 0956    Satisfactory for evaluation; transformation zone component PRESENT.   ADEQPAP (A) 05/29/2016 0000    Satisfactory for evaluation  endocervical/transformation zone component PRESENT.    High Risk HPV: Positive  Adequacy:  Satisfactory for evaluation, transformation zone component PRESENT  Diagnosis:  Atypical squamous cells of undetermined significance (ASC-US )  Last mammogram: n/a   Review of Systems:  Pertinent items are noted in HPI. Comprehensive review of systems was otherwise negative.   Objective:  Physical Exam BP 133/82   Pulse 91    Physical Exam Vitals and nursing note reviewed.  Constitutional:      Appearance: Normal appearance.  HENT:     Head: Normocephalic and atraumatic.  Pulmonary:     Effort: Pulmonary effort is normal.  Neurological:     General: No focal deficit present.     Mental Status: She is alert.  Psychiatric:        Mood and Affect: Mood normal.        Behavior: Behavior normal.        Thought Content: Thought content normal.        Judgment: Judgment normal.        Assessment & Plan:  1. Abnormal uterine bleeding (AUB) (Primary) 2. History of menorrhagia Discussed options of switching to contraception vs continuing megace . Noted that megace  can sometimes make it harder to control blood glucose and if she finds it difficult to control, can switch from megace  to aygestin. Discussed daily administration of megace  for now. Noted that prior US  normal and labs wnl except A1c. Recommend continued lifestyle modifications as that may help with regulating cycles.   3. Skin lesion History not consistent with abscess. Recommend completing same treatment as prior and if unsuccessful,  will need evaluation with PCP  Routine preventative health maintenance measures emphasized.  Carter Quarry, MD [facility], [facility]

## 2023-04-22 ENCOUNTER — Ambulatory Visit: Payer: Medicaid Other | Admitting: Obstetrics and Gynecology

## 2023-04-29 ENCOUNTER — Other Ambulatory Visit: Payer: Self-pay | Admitting: Student

## 2023-04-29 DIAGNOSIS — I10 Essential (primary) hypertension: Secondary | ICD-10-CM

## 2023-05-18 ENCOUNTER — Encounter (INDEPENDENT_AMBULATORY_CARE_PROVIDER_SITE_OTHER): Payer: Self-pay

## 2023-06-02 ENCOUNTER — Encounter (INDEPENDENT_AMBULATORY_CARE_PROVIDER_SITE_OTHER): Payer: Self-pay

## 2023-06-18 ENCOUNTER — Ambulatory Visit (INDEPENDENT_AMBULATORY_CARE_PROVIDER_SITE_OTHER): Payer: Medicaid Other | Admitting: Student

## 2023-06-18 ENCOUNTER — Encounter: Payer: Self-pay | Admitting: Student

## 2023-06-18 VITALS — BP 132/82 | HR 102 | Ht 63.0 in | Wt 251.4 lb

## 2023-06-18 DIAGNOSIS — R290 Tetany: Secondary | ICD-10-CM

## 2023-06-18 DIAGNOSIS — R7303 Prediabetes: Secondary | ICD-10-CM | POA: Diagnosis not present

## 2023-06-18 DIAGNOSIS — G4733 Obstructive sleep apnea (adult) (pediatric): Secondary | ICD-10-CM

## 2023-06-18 DIAGNOSIS — R253 Fasciculation: Secondary | ICD-10-CM

## 2023-06-18 DIAGNOSIS — Z6841 Body Mass Index (BMI) 40.0 and over, adult: Secondary | ICD-10-CM | POA: Diagnosis not present

## 2023-06-18 DIAGNOSIS — E559 Vitamin D deficiency, unspecified: Secondary | ICD-10-CM

## 2023-06-18 LAB — POCT GLYCOSYLATED HEMOGLOBIN (HGB A1C): Hemoglobin A1C: 5.6 % (ref 4.0–5.6)

## 2023-06-18 MED ORDER — TIRZEPATIDE-WEIGHT MANAGEMENT 2.5 MG/0.5ML ~~LOC~~ SOLN: 2.5000 mg | SUBCUTANEOUS | 1 refills | Status: DC

## 2023-06-18 NOTE — Patient Instructions (Addendum)
 Mckaylie,   Let's try to get your CPAP set up. This should help with headache, daytime tiredness, weight loss, and your blood pressure! I am ordering tirzepatide for weight loss. Let's see! I am checking your electrolytes for the fasciculations in your lip. I will either MyChart you or call you based on these results.  Eliezer Mccoy, MD

## 2023-06-18 NOTE — Progress Notes (Signed)
    SUBJECTIVE:   CHIEF COMPLAINT / HPI:   Lip Fasciculations Just started noticing these over the past few weeks. Located on the right side under her lower lip. She can feel the muscle twitching and fasciculations are externally visible. She denies any perioral numbness or tremor otherwise. Feeling well aside from this.   OSA Had a home sleep study a few months back but never has gotten set up with CPAP. Remains with some daytime sleepiness and headaches. Just this week had a global headache pretty much all week, though it is gone now.   Weight Loss Efforts Has been trying to lose weight without success. Has tried making dietary changes including moving away from excess sugars/fats and processed foods. Unfortunately is on megace for menorrhagia being managed by OB/Gyn which is working against her here. She tells me that she is entertaining the idea of going ahead with a hysterectomy.    OBJECTIVE:   BP 132/82   Pulse (!) 102   Ht 5\' 3"  (1.6 m)   Wt 251 lb 6.4 oz (114 kg)   SpO2 97%   BMI 44.53 kg/m   Gen: well appearing, in good spirits and NAD  Mouth: There are visible fasciculations of the lower lip on the right side. No overlying skin changes. Negative Chvostek sign.  Neuro: CN II-XII intact. Fundoscopy without papilledema, though there are chronic changes to the R eye secondary to historical traumatic injury. Strength and sensation are intact throughout. Balance is intact. Speech is fluent.   ASSESSMENT/PLAN:   Assessment & Plan Fasciculations Of the lip. No generalized tremor otherwise. No weakness on exam or focal findings to suggest hypocalcemia. - BMP, Mag, Phos, PTH, Vit D OSA (obstructive sleep apnea) Diagnosed on home sleep study. Mild, but given symptoms would recommend treatment. Will discuss with Dr. Maple Hudson to determine appropriate initial settings and order DME CPAP.  Prediabetes Repeat A1c 5.6% today. She has one A1c = 6.5% that was collected by her OB/Gyn.  Discussed that while this was diabetic range, we would need to see two such readings before labeling her as diabetic. She has been implementing a number of excellent dietary interventions as above.  - Continue dietary interventions - Continue metformin XR 2000mg  daily  BMI 40.0-44.9, adult (HCC) Weight is up 4 lbs since our last visit. Suspect that her Megace is working against her best efforts here. She is committed to the lifestyle changes that she has implemented as evidenced by her improved A1c.  - START Tirzepatide 2.5mg  weekly - She will follow-up with OB/Gyn and can discuss hysterectomy with them which would allow her to come off of Megace  - Treating her sleep apnea as above may also help with weight loss     J Dorothyann Gibbs, MD [facility]

## 2023-06-20 LAB — PHOSPHORUS: Phosphorus: 3.6 mg/dL (ref 3.0–4.3)

## 2023-06-20 LAB — BASIC METABOLIC PANEL
BUN/Creatinine Ratio: 12 (ref 9–23)
BUN: 7 mg/dL (ref 6–20)
CO2: 21 mmol/L (ref 20–29)
Calcium: 9.1 mg/dL (ref 8.7–10.2)
Chloride: 101 mmol/L (ref 96–106)
Creatinine, Ser: 0.57 mg/dL (ref 0.57–1.00)
Glucose: 63 mg/dL — ABNORMAL LOW (ref 70–99)
Potassium: 3.8 mmol/L (ref 3.5–5.2)
Sodium: 139 mmol/L (ref 134–144)
eGFR: 118 mL/min/{1.73_m2} (ref >59)

## 2023-06-20 LAB — VITAMIN D 25 HYDROXY (VIT D DEFICIENCY, FRACTURES): Vit D, 25-Hydroxy: 10.3 ng/mL — ABNORMAL LOW (ref 30.0–100.0)

## 2023-06-20 LAB — PARATHYROID HORMONE, INTACT (NO CA): PTH: 19 pg/mL (ref 15–65)

## 2023-06-20 LAB — MAGNESIUM: Magnesium: 1.7 mg/dL (ref 1.6–2.3)

## 2023-06-20 NOTE — Assessment & Plan Note (Signed)
 Weight is up 4 lbs since our last visit. Suspect that her Megace is working against her best efforts here. She is committed to the lifestyle changes that she has implemented as evidenced by her improved A1c.  - START Tirzepatide 2.5mg  weekly - She will follow-up with OB/Gyn and can discuss hysterectomy with them which would allow her to come off of Megace  - Treating her sleep apnea as above may also help with weight loss

## 2023-06-20 NOTE — Assessment & Plan Note (Signed)
 Repeat A1c 5.6% today. She has one A1c = 6.5% that was collected by her OB/Gyn. Discussed that while this was diabetic range, we would need to see two such readings before labeling her as diabetic. She has been implementing a number of excellent dietary interventions as above.  - Continue dietary interventions - Continue metformin XR 2000mg  daily

## 2023-06-21 ENCOUNTER — Telehealth: Payer: Self-pay

## 2023-06-21 ENCOUNTER — Other Ambulatory Visit (HOSPITAL_COMMUNITY): Payer: Self-pay

## 2023-06-21 NOTE — Telephone Encounter (Signed)
 Pharmacy Patient Advocate Encounter   Received notification from CoverMyMeds that prior authorization for ZEPBOUND is required/requested.   Insurance verification completed.   The patient is insured through [facility] .   PA required; PA submitted to above mentioned insurance via CoverMyMeds Key/confirmation #/EOC [id number]. Status is pending

## 2023-06-23 ENCOUNTER — Telehealth: Payer: Self-pay

## 2023-06-23 NOTE — Telephone Encounter (Signed)
 Patient LVM on nurse line requesting lab results.   She reports she saw her low Vitamin D on mychart.   Will forward to PCP.

## 2023-06-24 ENCOUNTER — Encounter: Payer: Self-pay | Admitting: Student

## 2023-06-24 MED ORDER — VITAMIN D (ERGOCALCIFEROL) 1.25 MG (50000 UNIT) PO CAPS: 50000.0000 [IU] | ORAL_CAPSULE | ORAL | 0 refills | Status: DC

## 2023-06-24 NOTE — Telephone Encounter (Signed)
 Pharmacy Patient Advocate Encounter  Received notification from [employer] that Prior Authorization for ZEPBOUND has been DENIED.  Full denial letter will be uploaded to the media tab. See denial reason below.    Requires a trial and failure of Wegovy, with completion of 3-6 months.   PA #/Case ID/Reference #: [id number]

## 2023-06-24 NOTE — Addendum Note (Signed)
 Addended by: Darnelle Spangle B on: 06/24/2023 07:56 AM   Modules accepted: Orders

## 2023-07-12 DIAGNOSIS — H5213 Myopia, bilateral: Secondary | ICD-10-CM | POA: Diagnosis not present

## 2023-07-26 ENCOUNTER — Ambulatory Visit: Admitting: Student

## 2023-07-26 ENCOUNTER — Encounter: Payer: Self-pay | Admitting: Student

## 2023-07-26 VITALS — BP 125/78 | HR 83 | Ht 63.0 in | Wt 256.4 lb

## 2023-07-26 DIAGNOSIS — G4733 Obstructive sleep apnea (adult) (pediatric): Secondary | ICD-10-CM | POA: Diagnosis not present

## 2023-07-26 DIAGNOSIS — R7303 Prediabetes: Secondary | ICD-10-CM

## 2023-07-26 DIAGNOSIS — R519 Headache, unspecified: Secondary | ICD-10-CM

## 2023-07-26 DIAGNOSIS — R42 Dizziness and giddiness: Secondary | ICD-10-CM | POA: Diagnosis not present

## 2023-07-26 MED ORDER — FLUTICASONE PROPIONATE 50 MCG/ACT NA SUSP: 2.0000 | Freq: Every day | NASAL | 6 refills | Status: AC

## 2023-07-26 MED ORDER — WEGOVY 0.25 MG/0.5ML ~~LOC~~ SOAJ
0.2500 mg | SUBCUTANEOUS | 0 refills | Status: DC
Start: 2023-07-26 — End: 2023-09-09

## 2023-07-26 NOTE — Patient Instructions (Addendum)
 It was great to see you! Thank you for allowing me to participate in your care!   Our plans for today:  - Your headache is likely due to congestion, vision prescription and tension component. Your mild OSA could be contributing as well - Instead of aspirin I would try either Tylenol or Excedrin to help with the acute headache - I am sending in flonase as well for headache + congestion - It is unclear right now what the dizziness is caused by but your neurologic examination looks well, if this continues to worsen then I recommend coming back - If you are having any vomiting, double vision, fevers this headache please go to the emergency department - Your wegovy got denied due to needing a trial of Wegovy for 3 to 6 months-I will attempt to send in the [facility] again and see whether this is approved and also send a message to our pharmacy technician  Take care and seek immediate care sooner if you develop any concerns.  Levin Erp, MD

## 2023-07-26 NOTE — Progress Notes (Signed)
    SUBJECTIVE:   CHIEF COMPLAINT / HPI: Headache  Discussed the use of AI scribe software for clinical note transcription with the patient, who gave verbal consent to proceed.  History of Present Illness Persistent headaches for about a week. The headaches are described as on and off, lasting for about an hour or two each time. The patient also reports waking up sweating and experiencing dizziness and imbalance, particularly when walking for extended periods. The dizziness is described as a sensation of the room spinning, but it does not occur with head movements. No ringing of ears. The patient also reports a feeling of congestion in the chest, particularly when sleeping, and occasional coughing.  The patient has been managing the headaches with aspirin, but reports no significant relief. The patient also mentions a history of an eye injury in 2006 and a recent eye exam indicating the need for glasses for the left eye. The patient is currently awaiting the glasses, which are expected to arrive in six to eight weeks.   The patient is currently on a combination pill for hypertension and has been trying to get approval for weight loss injections to manage pre-diabetes and aid in weight loss. The patient reports a recent A1C of 5.6, indicating pre-diabetes.    PERTINENT  PMH / PSH: hypertension, sleep apnea, and pre-diabetes  OBJECTIVE:   BP 125/78   Pulse 83   Ht 5\' 3"  (1.6 m)   Wt 256 lb 6.4 oz (116.3 kg)   SpO2 100%   BMI 45.42 kg/m   General: Well appearing, NAD, awake, alert, responsive to questions Head: Normocephalic atraumatic CV: Regular rate and rhythm no murmurs rubs or gallops Respiratory: Clear to ausculation bilaterally, intermittent wheezes, chest rises symmetrically,  no increased work of breathing Neuro: CN II: PERRL CN III, IV,VI: EOMI CV V: Normal sensation in V1, V2, V3 CVII: Symmetric smile and brow raise CN VIII: Normal hearing CN IX,X: Symmetric palate raise   CN XI: 5/5 shoulder shrug CN XII: Symmetric tongue protrusion  UE and LE strength 5/5 Normal sensation in UE and LE bilaterally  Normal gait   ASSESSMENT/PLAN:   Assessment & Plan Intractable episodic headache, unspecified headache type Intermittent headaches with sweating, dizziness, photophobia, and phonophobia. Likely multifactorial d/t sinus congestion, vision strain, OSA or tension type. Normal neurologic exam - Prescribe Flonase nasal spray for sinus congestion. - Recommend acetaminophen or Excedrin for headache management. - Encourage use of prescribed glasses. - Monitor for worsening symptoms or new symptoms such as tinnitus or dizziness with head movements. - ED/return precautions OSA (obstructive sleep apnea) Not interested in CPAP. Zepbound was denied. -ZOXWRU Rx sent Dizziness Episodes of vertigo post-physical activity. Differential includes orthostatic hypotension, dehydration, or vestibular issues. Likely related to rapid position changes or exertion. - Advise monitoring for patterns or triggers. - Encourage adequate hydration. - Reassess if symptoms persist or worsen.  General Health Maintenance Smoker, half a pack per day, attempting to quit.  -Consider PFTs  Levin Erp, MD [facility]

## 2023-07-27 ENCOUNTER — Telehealth: Payer: Self-pay

## 2023-07-27 NOTE — Telephone Encounter (Signed)
 Pharmacy Patient Advocate Encounter   Received notification from CoverMyMeds that prior authorization for [facility] is required/requested.   Insurance verification completed.   The patient is insured through [facility] .   PA required; PA submitted to above mentioned insurance via CoverMyMeds Key/confirmation #/EOC [id number]. Status is pending

## 2023-07-28 NOTE — Telephone Encounter (Signed)
 Pharmacy Patient Advocate Encounter  Received notification from [facility] that Prior Authorization for [facility] 0.25MG  has been APPROVED from 07/27/23 to 01/23/24   PA #/Case ID/Reference #: [id number]

## 2023-08-04 ENCOUNTER — Ambulatory Visit: Admitting: *Deleted

## 2023-08-04 ENCOUNTER — Other Ambulatory Visit: Payer: Self-pay

## 2023-08-04 ENCOUNTER — Other Ambulatory Visit (HOSPITAL_COMMUNITY)
Admission: RE | Admit: 2023-08-04 | Discharge: 2023-08-04 | Disposition: A | Discharge status: Home / Self Care | Source: Ambulatory Visit | Attending: Family Medicine | Admitting: Family Medicine

## 2023-08-04 VITALS — BP 127/86 | HR 78 | Ht 63.0 in | Wt 256.0 lb

## 2023-08-04 DIAGNOSIS — N898 Other specified noninflammatory disorders of vagina: Secondary | ICD-10-CM | POA: Insufficient documentation

## 2023-08-04 NOTE — Progress Notes (Signed)
 Pt presents with c/o clear yellow vaginal discharge x1 week.  She also reports some itching. Self vaginal swab was obtained. Pt was advised that she will receive results and treatment information via Mychart. She voiced understanding and stated that she prefers Metrogel if her test is positive for BV.

## 2023-08-05 ENCOUNTER — Encounter: Payer: Self-pay | Admitting: Family Medicine

## 2023-08-05 ENCOUNTER — Other Ambulatory Visit: Payer: Self-pay | Admitting: Family Medicine

## 2023-08-05 LAB — CERVICOVAGINAL ANCILLARY ONLY
Bacterial Vaginitis (gardnerella): POSITIVE — AB
Candida Glabrata: NEGATIVE
Candida Vaginitis: NEGATIVE
Chlamydia: NEGATIVE
Comment: NEGATIVE
Comment: NEGATIVE
Comment: NEGATIVE
Comment: NEGATIVE
Comment: NEGATIVE
Comment: NORMAL
Neisseria Gonorrhea: NEGATIVE
Trichomonas: NEGATIVE

## 2023-08-05 MED ORDER — METRONIDAZOLE 500 MG PO TABS: 500.0000 mg | ORAL_TABLET | Freq: Two times a day (BID) | ORAL | 0 refills | Status: DC

## 2023-08-09 ENCOUNTER — Other Ambulatory Visit: Payer: Self-pay

## 2023-08-09 DIAGNOSIS — B9689 Other specified bacterial agents as the cause of diseases classified elsewhere: Secondary | ICD-10-CM

## 2023-08-09 MED ORDER — METRONIDAZOLE 0.75 % VA GEL: 1.0000 | Freq: Every day | VAGINAL | 0 refills | Status: AC

## 2023-08-17 ENCOUNTER — Other Ambulatory Visit: Payer: Self-pay | Admitting: Student

## 2023-08-17 DIAGNOSIS — E559 Vitamin D deficiency, unspecified: Secondary | ICD-10-CM

## 2023-08-22 ENCOUNTER — Other Ambulatory Visit: Payer: Self-pay | Admitting: Student

## 2023-08-22 DIAGNOSIS — I10 Essential (primary) hypertension: Secondary | ICD-10-CM

## 2023-09-07 ENCOUNTER — Ambulatory Visit: Admitting: Student

## 2023-09-08 ENCOUNTER — Encounter: Payer: Self-pay | Admitting: *Deleted

## 2023-09-09 ENCOUNTER — Ambulatory Visit: Admitting: Student

## 2023-09-09 ENCOUNTER — Encounter: Payer: Self-pay | Admitting: Student

## 2023-09-09 VITALS — BP 127/92 | HR 86 | Ht 63.0 in | Wt 262.4 lb

## 2023-09-09 DIAGNOSIS — Z6841 Body Mass Index (BMI) 40.0 and over, adult: Secondary | ICD-10-CM | POA: Diagnosis not present

## 2023-09-09 DIAGNOSIS — Z0000 Encounter for general adult medical examination without abnormal findings: Secondary | ICD-10-CM | POA: Diagnosis not present

## 2023-09-09 MED ORDER — WEGOVY 0.5 MG/0.5ML ~~LOC~~ SOAJ: 0.5000 mg | SUBCUTANEOUS | 3 refills | Status: DC

## 2023-09-09 NOTE — Progress Notes (Signed)
    SUBJECTIVE:   CHIEF COMPLAINT / HPI:   Discussed the use of AI scribe software for clinical note transcription with the patient, who gave verbal consent to proceed.  History of Present Illness   Julie Hendrix is a 38 year old female who presents for follow-up on Wegovy  treatment for weight management.  She is on 0.25 mg of Wegovy  for two months without significant side effects like nausea, vomiting, or constipation. Says she still feels constantly hungry.   She has not taken metformin  since starting Wegovy , as her A1c is 5.6. She works third shift, contributing to her tiredness. Her family history includes obesity in her mother and sister.      PERTINENT  PMH / PSH: HTN, PCOS  OBJECTIVE:   BP (!) 127/92   Pulse 86   Ht 5\' 3"  (1.6 m)   Wt 262 lb 6.4 oz (119 kg)   SpO2 98%   BMI 46.48 kg/m   General: NAD, well appearing Cardiac: RRR Neuro: A&O Respiratory: normal WOB on RA. No wheezing or crackles on auscultation, good lung sounds throughout Extremities: Moving all 4 extremities equally  ASSESSMENT/PLAN:   BMI 40.0-44.9, adult (HCC) Managed with Wegovy . Current dose 0.25 mg tolerated well. Plan to increase to 0.5 mg for weight reduction. Discussed high protein intake to maintain muscle mass.  - Increase Wegovy  to 0.5 mg after completing the current 0.25 mg doses. - Advise high-protein diet to maintain muscle mass. - Discontinue metformin  and monitor A1c levels.     Dalton Mille, DO [facility]

## 2023-09-09 NOTE — Patient Instructions (Signed)
 It was great seeing you today.  As we discussed, - Finish current Wegovy  pens at home - I sent in the next dose (0.5 mg weekly) to your pharmacy - Make sure to eat enough protein rich foods to avoid muscle mass loss   If you have any questions or concerns, please feel free to call the clinic.   Have a wonderful day,  Dr. Vallorie Gayer [facility] [phone]

## 2023-09-10 NOTE — Assessment & Plan Note (Signed)
 Managed with Wegovy . Current dose 0.25 mg tolerated well. Plan to increase to 0.5 mg for weight reduction. Discussed high protein intake to maintain muscle mass.  - Increase Wegovy  to 0.5 mg after completing the current 0.25 mg doses. - Advise high-protein diet to maintain muscle mass. - Discontinue metformin  and monitor A1c levels.

## 2023-09-28 ENCOUNTER — Encounter: Payer: Self-pay | Admitting: *Deleted

## 2023-10-11 ENCOUNTER — Ambulatory Visit

## 2023-10-12 ENCOUNTER — Other Ambulatory Visit: Payer: Self-pay | Admitting: Student

## 2023-10-12 DIAGNOSIS — E559 Vitamin D deficiency, unspecified: Secondary | ICD-10-CM

## 2023-11-22 ENCOUNTER — Other Ambulatory Visit: Payer: Self-pay | Admitting: *Deleted

## 2023-11-22 DIAGNOSIS — I10 Essential (primary) hypertension: Secondary | ICD-10-CM

## 2023-11-22 MED ORDER — AMLODIPINE-OLMESARTAN 10-20 MG PO TABS: 1.0000 | ORAL_TABLET | Freq: Every day | ORAL | 2 refills | Status: DC

## 2023-12-08 ENCOUNTER — Other Ambulatory Visit: Payer: Self-pay

## 2023-12-08 ENCOUNTER — Emergency Department (HOSPITAL_BASED_OUTPATIENT_CLINIC_OR_DEPARTMENT_OTHER)
Admission: EM | Admit: 2023-12-08 | Discharge: 2023-12-08 | Disposition: A | Discharge status: Home / Self Care | Attending: Emergency Medicine | Admitting: Emergency Medicine

## 2023-12-08 ENCOUNTER — Emergency Department (HOSPITAL_BASED_OUTPATIENT_CLINIC_OR_DEPARTMENT_OTHER)

## 2023-12-08 DIAGNOSIS — D72829 Elevated white blood cell count, unspecified: Secondary | ICD-10-CM | POA: Insufficient documentation

## 2023-12-08 DIAGNOSIS — E119 Type 2 diabetes mellitus without complications: Secondary | ICD-10-CM | POA: Insufficient documentation

## 2023-12-08 DIAGNOSIS — R0789 Other chest pain: Secondary | ICD-10-CM | POA: Diagnosis not present

## 2023-12-08 DIAGNOSIS — M546 Pain in thoracic spine: Secondary | ICD-10-CM | POA: Insufficient documentation

## 2023-12-08 DIAGNOSIS — K760 Fatty (change of) liver, not elsewhere classified: Secondary | ICD-10-CM | POA: Diagnosis not present

## 2023-12-08 DIAGNOSIS — M549 Dorsalgia, unspecified: Secondary | ICD-10-CM | POA: Diagnosis not present

## 2023-12-08 DIAGNOSIS — R072 Precordial pain: Secondary | ICD-10-CM

## 2023-12-08 DIAGNOSIS — I517 Cardiomegaly: Secondary | ICD-10-CM | POA: Diagnosis not present

## 2023-12-08 DIAGNOSIS — F17210 Nicotine dependence, cigarettes, uncomplicated: Secondary | ICD-10-CM | POA: Diagnosis not present

## 2023-12-08 DIAGNOSIS — I10 Essential (primary) hypertension: Secondary | ICD-10-CM | POA: Diagnosis not present

## 2023-12-08 DIAGNOSIS — R079 Chest pain, unspecified: Secondary | ICD-10-CM | POA: Diagnosis not present

## 2023-12-08 LAB — CBC WITH DIFFERENTIAL/PLATELET
Abs Immature Granulocytes: 0.02 K/uL (ref 0.00–0.07)
Basophils Absolute: 0.1 K/uL (ref 0.0–0.1)
Basophils Relative: 1 %
Eosinophils Absolute: 0.1 K/uL (ref 0.0–0.5)
Eosinophils Relative: 1 %
HCT: 40.5 % (ref 36.0–46.0)
Hemoglobin: 13.5 g/dL (ref 12.0–15.0)
Immature Granulocytes: 0 %
Lymphocytes Relative: 19 %
Lymphs Abs: 2 K/uL (ref 0.7–4.0)
MCH: 28.8 pg (ref 26.0–34.0)
MCHC: 33.3 g/dL (ref 30.0–36.0)
MCV: 86.5 fL (ref 80.0–100.0)
Monocytes Absolute: 0.8 K/uL (ref 0.1–1.0)
Monocytes Relative: 7 %
Neutro Abs: 7.7 K/uL (ref 1.7–7.7)
Neutrophils Relative %: 72 %
Platelets: 265 K/uL (ref 150–400)
RBC: 4.68 MIL/uL (ref 3.87–5.11)
RDW: 13.4 % (ref 11.5–15.5)
WBC: 10.7 K/uL — ABNORMAL HIGH (ref 4.0–10.5)
nRBC: 0 % (ref 0.0–0.2)

## 2023-12-08 LAB — COMPREHENSIVE METABOLIC PANEL WITH GFR
ALT: 17 U/L (ref 0–44)
AST: 25 U/L (ref 15–41)
Albumin: 4.3 g/dL (ref 3.5–5.0)
Alkaline Phosphatase: 62 U/L (ref 38–126)
Anion gap: 12 (ref 5–15)
BUN: 10 mg/dL (ref 6–20)
CO2: 20 mmol/L — ABNORMAL LOW (ref 22–32)
Calcium: 9.1 mg/dL (ref 8.9–10.3)
Chloride: 105 mmol/L (ref 98–111)
Creatinine, Ser: 0.55 mg/dL (ref 0.44–1.00)
GFR, Estimated: >60 mL/min (ref >60)
Glucose, Bld: 86 mg/dL (ref 70–99)
Potassium: 4.2 mmol/L (ref 3.5–5.1)
Sodium: 137 mmol/L (ref 135–145)
Total Bilirubin: 0.3 mg/dL (ref 0.0–1.2)
Total Protein: 7.3 g/dL (ref 6.5–8.1)

## 2023-12-08 LAB — TROPONIN T, HIGH SENSITIVITY
Troponin T High Sensitivity: <15 ng/L (ref 0–19)
Troponin T High Sensitivity: <15 ng/L (ref 0–19)

## 2023-12-08 LAB — HCG, SERUM, QUALITATIVE: Preg, Serum: NEGATIVE

## 2023-12-08 LAB — D-DIMER, QUANTITATIVE: D-Dimer, Quant: 0.29 ug{FEU}/mL (ref 0.00–0.50)

## 2023-12-08 LAB — LIPASE, BLOOD: Lipase: 23 U/L (ref 11–51)

## 2023-12-08 MED ORDER — PANTOPRAZOLE SODIUM 40 MG IV SOLR
40.0000 mg | Freq: Once | INTRAVENOUS | Status: AC
  Administered 2023-12-08: 40 mg via INTRAVENOUS
  Filled 2023-12-08: qty 10

## 2023-12-08 MED ORDER — IOHEXOL 350 MG/ML SOLN
100.0000 mL | Freq: Once | INTRAVENOUS | Status: AC | PRN
  Administered 2023-12-08: 100 mL via INTRAVENOUS

## 2023-12-08 NOTE — ED Triage Notes (Signed)
 C/o generalized back pain since Sunday. Unsure of injury to area. Denies urinary problems.

## 2023-12-08 NOTE — Discharge Instructions (Signed)
 Take your omeprazole  that you have at home.  You can substitute it with antiacid as well.  Make an appointment to follow-up with your doctor.  Extensive workup here today without any acute findings.  No concerns for heart event no signs of blood clots in the lungs.  No dissection in the big vessels.  Work note provided for you to be out of work until Saturday.  Return for any new or worse symptoms.

## 2023-12-08 NOTE — ED Provider Notes (Addendum)
 [place] EMERGENCY DEPARTMENT AT [facility] Provider Note   CSN: [id number] Arrival date & time: 12/08/23  1517     Patient presents with: Back Pain   Julie Hendrix is a 38 y.o. female.   Patient with a complaint of upper thoracic back pain with some anterior chest pain since Monday.  Patient is also had a lot of belching.  No abdominal pain no nausea no vomiting no shortness of breath.  Patient is had low back pain problems in the past but never anything like this.  Past medical history is significant for borderline diabetes hypertension.  Patient everyday smoker last attempted to stop in 2022.  Patient last seen in the emergency department for precordial chest pain in December 2024.  Patient's vital signs temp 98.6 pulse 78 respirations 18 oxygen saturation 99% blood pressure 124/77.       Prior to Admission medications   Medication Sig Start Date End Date Taking? Authorizing Provider  amlodipine -olmesartan  (AZOR ) 10-20 MG tablet Take 1 tablet by mouth daily. 11/22/23   Larraine Palma, MD  fluticasone  (FLONASE ) 50 MCG/ACT nasal spray Place 2 sprays into both nostrils daily. 07/26/23   Christia Budds, MD  megestrol  (MEGACE ) 40 MG tablet Take 2 tablets by mouth daily. Can increase to 2 tablets twice daily in the event of heavy bleeding. Patient not taking: Reported on 08/04/2023 03/29/23   Ajewole, Christana, MD  psyllium (METAMUCIL SMOOTH TEXTURE) 58.6 % powder Take 1 packet by mouth 3 (three) times daily. Patient not taking: Reported on 08/04/2023 08/11/22   Espinoza, Alejandra, DO  Semaglutide -Weight Management (WEGOVY ) 0.5 MG/0.5ML SOAJ Inject 0.5 mg into the skin once a week. 09/09/23   Dameron, Marisa, DO  Vitamin D , Ergocalciferol , (DRISDOL ) 1.25 MG (50000 UNIT) CAPS capsule TAKE 1 CAPSULE BY MOUTH EVERY 7 DAYS 10/13/23   Marlee Lynwood NOVAK, MD    Allergies: Patient has no known allergies.    Review of Systems  Constitutional:  Negative for chills and  fever.  HENT:  Negative for ear pain and sore throat.   Eyes:  Negative for pain and visual disturbance.  Respiratory:  Negative for cough and shortness of breath.   Cardiovascular:  Positive for chest pain. Negative for palpitations and leg swelling.  Gastrointestinal:  Negative for abdominal pain and vomiting.  Genitourinary:  Negative for dysuria and hematuria.  Musculoskeletal:  Positive for back pain. Negative for arthralgias.  Skin:  Negative for color change and rash.  Neurological:  Negative for seizures and syncope.  All other systems reviewed and are negative.   Updated Vital Signs BP 104/81   Pulse 75   Temp 98.6 F (37 C) (Oral) Comment: Simultaneous filing. User may not have seen previous data.  Resp (!) 24   SpO2 99%   Physical Exam Vitals and nursing note reviewed.  Constitutional:      General: She is not in acute distress.    Appearance: Normal appearance. She is well-developed. She is obese.  HENT:     Head: Normocephalic and atraumatic.     Mouth/Throat:     Mouth: Mucous membranes are moist.  Eyes:     Extraocular Movements: Extraocular movements intact.     Conjunctiva/sclera: Conjunctivae normal.     Pupils: Pupils are equal, round, and reactive to light.  Cardiovascular:     Rate and Rhythm: Normal rate and regular rhythm.     Heart sounds: No murmur heard. Pulmonary:     Effort: Pulmonary effort is normal. No  respiratory distress.     Breath sounds: Normal breath sounds. No wheezing, rhonchi or rales.  Abdominal:     Palpations: Abdomen is soft.     Tenderness: There is no abdominal tenderness.  Musculoskeletal:        General: No swelling or tenderness.     Cervical back: Normal range of motion and neck supple.     Comments: Back nontender to palpation thoracic lumbar cervical.  Skin:    General: Skin is warm and dry.     Capillary Refill: Capillary refill takes less than 2 seconds.  Neurological:     General: No focal deficit present.      Mental Status: She is alert and oriented to person, place, and time.  Psychiatric:        Mood and Affect: Mood normal.     (all labs ordered are listed, but only abnormal results are displayed) Labs Reviewed  CBC WITH DIFFERENTIAL/PLATELET - Abnormal; Notable for the following components:      Result Value   WBC 10.7 (*)    All other components within normal limits  COMPREHENSIVE METABOLIC PANEL WITH GFR - Abnormal; Notable for the following components:   CO2 20 (*)    All other components within normal limits  LIPASE, BLOOD  D-DIMER, QUANTITATIVE  HCG, SERUM, QUALITATIVE  TROPONIN T, HIGH SENSITIVITY  TROPONIN T, HIGH SENSITIVITY    EKG: EKG Interpretation Date/Time:  Wednesday December 08 2023 16:26:40 EDT Ventricular Rate:  72 PR Interval:  191 QRS Duration:  89 QT Interval:  393 QTC Calculation: 431 R Axis:   71  Text Interpretation: Sinus rhythm Low voltage, precordial leads ST elev, probable normal early repol pattern No significant change since last tracing Confirmed by Shinika Estelle [phone]) on 12/08/2023 4:37:00 PM  Radiology: CT Angio Chest/Abd/Pel for Dissection W and/or W/WO Result Date: 12/08/2023 CLINICAL DATA:  Generalized back pain EXAM: CT ANGIOGRAPHY CHEST, ABDOMEN AND PELVIS TECHNIQUE: Non-contrast CT of the chest was initially obtained. Multidetector CT imaging through the chest, abdomen and pelvis was performed using the standard protocol during bolus administration of intravenous contrast. Multiplanar reconstructed images and MIPs were obtained and reviewed to evaluate the vascular anatomy. RADIATION DOSE REDUCTION: This exam was performed according to the departmental dose-optimization program which includes automated exposure control, adjustment of the mA and/or kV according to patient size and/or use of iterative reconstruction technique. CONTRAST:  OMNIPAQUE  IOHEXOL  350 MG/ML SOLN COMPARISON:  Chest x-ray 12/08/2023, CT 02/08/2023 FINDINGS: CTA  CHEST FINDINGS Cardiovascular: Non contrasted images of the chest demonstrate no acute intramural hematoma. Negative for aneurysm or dissection. Normal cardiac size. No pericardial effusion Mediastinum/Nodes: No enlarged mediastinal, hilar, or axillary lymph nodes. Thyroid  gland, trachea, and esophagus demonstrate no significant findings. Lungs/Pleura: Lungs are clear. No pleural effusion or pneumothorax. Musculoskeletal: No chest wall abnormality. No acute or significant osseous findings. Review of the MIP images confirms the above findings. CTA ABDOMEN AND PELVIS FINDINGS VASCULAR Aorta: Normal caliber aorta without aneurysm, dissection, vasculitis or significant stenosis. Celiac: Patent without evidence of aneurysm, dissection, vasculitis or significant stenosis. SMA: Patent without evidence of aneurysm, dissection, vasculitis or significant stenosis. Renals: Both renal arteries are patent without evidence of aneurysm, dissection, vasculitis, fibromuscular dysplasia or significant stenosis. IMA: Patent without evidence of aneurysm, dissection, vasculitis or significant stenosis. Inflow: Patent without evidence of aneurysm, dissection, vasculitis or significant stenosis. Veins: Suboptimally assessed Review of the MIP images confirms the above findings. NON-VASCULAR Hepatobiliary: Hepatic steatosis. No calcified gallstone or biliary dilatation  Pancreas: Unremarkable. No pancreatic ductal dilatation or surrounding inflammatory changes. Spleen: Normal in size without focal abnormality. Adrenals/Urinary Tract: Adrenal glands are unremarkable. Kidneys are normal, without renal calculi, focal lesion, or hydronephrosis. Bladder is unremarkable. Stomach/Bowel: Stomach is within normal limits. Appendix appears normal. No evidence of bowel wall thickening, distention, or inflammatory changes. Lymphatic: No suspicious lymph nodes Reproductive: Uterus unremarkable.  No suspicious adnexal mass. Other: Negative for pelvic  effusion or free air Musculoskeletal: No acute or suspicious osseous abnormality Review of the MIP images confirms the above findings. IMPRESSION: 1. Negative for acute aortic dissection or aneurysm. No significant vascular disease within the abdomen or pelvis 2. No CT evidence for acute intrathoracic, intra-abdominal, or intrapelvic abnormality. 3. Hepatic steatosis. Electronically Signed   By: Luke Bun M.D.   On: 12/08/2023 19:06   DG Chest Port 1 View Result Date: 12/08/2023 CLINICAL DATA:  Chest pain and back pain EXAM: PORTABLE CHEST 1 VIEW COMPARISON:  Chest x-ray 04/04/2023 FINDINGS: Heart is borderline enlarged, unchanged. Both lungs are clear. The visualized skeletal structures are unremarkable. IMPRESSION: No active disease.  Stable borderline cardiomegaly. Electronically Signed   By: Greig Pique M.D.   On: 12/08/2023 17:14     Procedures   Medications Ordered in the ED  pantoprazole  (PROTONIX ) injection 40 mg (40 mg Intravenous Given 12/08/23 1628)  iohexol  (OMNIPAQUE ) 350 MG/ML injection 100 mL (100 mLs Intravenous Contrast Given 12/08/23 1832)                                    Medical Decision Making Amount and/or Complexity of Data Reviewed Labs: ordered. Radiology: ordered.  Risk Prescription drug management.   Likely concerned about this being an atypical presentation of cardiac event.  Also little concerned about potential dissection.  With all the thoracic back pain.  But blood pressure is good.  Will get D-dimer will get troponins will get chest x-ray.  Clinically I doubt that this is thoracic muscular back pain.  Labs will include CBC complete metabolic panel lipase.  CBC white count 10.7 hemoglobin 13.5 platelets 265.  Complete metabolic panel liver function test are normal renal functions normal.  Electrolytes normal.  Lipase normal at 23.  Initial troponin less than 15 D-dimer 0.29 which is very reassuring.  Portable chest x-ray without any acute  findings.  With normal troponin and normal D-dimer unlikely to be dissection and/or PE.  Certainly PE should be ruled out with a normal D-dimer.  But we will go ahead and do a CT dissection study because of her pain being so intense in that upper back area.  And will need delta troponin.  Patient's troponins x 2 less than 15 pregnancy test negative CBC white count 10.7 hemoglobin 13.5 platelets 265 complete metabolic panel completely normal including renal function test.  Lipase normal.  D-dimer also normal at 0.29.  CT angio chest abdomen and pelvis without any acute findings.  No evidence of dissection.  We have ruled out cardiac cause we had ruled out pulmonary embolism cause and we have ruled out dissection cause.  This may just be musculoskeletal thoracic back pain.  Always possible could be esophageal pain.  Patient states at one point in time she was on omeprazole  in the past.  Still has a weeks worth at home.  Will have her start that.  Give her a work note.  Have her follow-up with her primary care doctor.  Final diagnoses:  Precordial pain  Acute midline thoracic back pain    ED Discharge Orders     None          Geraldene Hamilton, MD 12/08/23 1614    Geraldene Hamilton, MD 12/08/23 BERYL Geraldene Hamilton, MD 12/08/23 ARTEMUS    Geraldene Hamilton, MD 12/08/23 2014

## 2024-01-18 ENCOUNTER — Other Ambulatory Visit: Payer: Self-pay

## 2024-01-18 ENCOUNTER — Ambulatory Visit (INDEPENDENT_AMBULATORY_CARE_PROVIDER_SITE_OTHER): Admitting: *Deleted

## 2024-01-18 ENCOUNTER — Other Ambulatory Visit (HOSPITAL_COMMUNITY)
Admission: RE | Admit: 2024-01-18 | Discharge: 2024-01-18 | Disposition: A | Discharge status: Home / Self Care | Source: Ambulatory Visit | Attending: Family Medicine | Admitting: Family Medicine

## 2024-01-18 VITALS — BP 130/87 | HR 72 | Ht 63.0 in | Wt 253.2 lb

## 2024-01-18 DIAGNOSIS — N898 Other specified noninflammatory disorders of vagina: Secondary | ICD-10-CM | POA: Insufficient documentation

## 2024-01-18 DIAGNOSIS — I10 Essential (primary) hypertension: Secondary | ICD-10-CM

## 2024-01-18 NOTE — Progress Notes (Addendum)
 Here for self swab. C/o vaginal odor and vaginal itching. Self swab collected. Prefers Metrogel  if has BV again. Informed will be contacted if results need treatment. She voices understanding. Rock Skip PEAK

## 2024-01-19 ENCOUNTER — Other Ambulatory Visit: Payer: Self-pay

## 2024-01-19 LAB — CERVICOVAGINAL ANCILLARY ONLY
Bacterial Vaginitis (gardnerella): POSITIVE — AB
Candida Glabrata: NEGATIVE
Candida Vaginitis: NEGATIVE
Chlamydia: NEGATIVE
Comment: NEGATIVE
Comment: NEGATIVE
Comment: NEGATIVE
Comment: NEGATIVE
Comment: NEGATIVE
Comment: NORMAL
Neisseria Gonorrhea: NEGATIVE
Trichomonas: NEGATIVE

## 2024-01-20 MED ORDER — WEGOVY 0.5 MG/0.5ML ~~LOC~~ SOAJ: 0.5000 mg | SUBCUTANEOUS | 3 refills | Status: DC

## 2024-01-21 NOTE — Telephone Encounter (Signed)
 Pharmacy Patient Advocate Encounter  Effective October 1st, [place] will discontinue coverage of GLP1 medications for weight loss (such as Wegovy  and Zepbound ), unless the patient has a documented history of a heart attack or stroke. Zepbound  will continue to be covered only for patients with moderate to severe sleep apnea (AHI 15-30) and a BMI greater than 40. Because of this change, the prior authorization team will not be submitting new PA requests for GLP1 medications prescribed for weight loss , as patients will be unable to continue therapy under Medicaid coverage.

## 2024-01-22 ENCOUNTER — Ambulatory Visit: Payer: Self-pay | Admitting: Obstetrics and Gynecology

## 2024-01-22 MED ORDER — METRONIDAZOLE 0.75 % VA GEL: 1.0000 | Freq: Every day | VAGINAL | 1 refills | Status: AC

## 2024-01-27 ENCOUNTER — Ambulatory Visit (INDEPENDENT_AMBULATORY_CARE_PROVIDER_SITE_OTHER)

## 2024-01-27 VITALS — BP 133/74 | HR 80 | Ht 63.0 in | Wt 256.0 lb

## 2024-01-27 DIAGNOSIS — G4733 Obstructive sleep apnea (adult) (pediatric): Secondary | ICD-10-CM

## 2024-01-27 DIAGNOSIS — Z6841 Body Mass Index (BMI) 40.0 and over, adult: Secondary | ICD-10-CM | POA: Diagnosis not present

## 2024-01-27 DIAGNOSIS — E559 Vitamin D deficiency, unspecified: Secondary | ICD-10-CM | POA: Diagnosis not present

## 2024-01-27 DIAGNOSIS — R7303 Prediabetes: Secondary | ICD-10-CM | POA: Diagnosis not present

## 2024-01-27 LAB — POCT GLYCOSYLATED HEMOGLOBIN (HGB A1C): Hemoglobin A1C: 5.3 % (ref 4.0–5.6)

## 2024-01-27 NOTE — Assessment & Plan Note (Addendum)
 Unfortunately, insurance not covering Wegovy  anymore, the patient reports she did not have great weight loss on this medication.  Motivated to lose weight.  Had extensive discussion regarding dietary interventions.  Also discussed how exercise is not as helpful with weight loss, but has a number of other health benefits. - Plan to substitute foods, grilled for fried, fruits/veggies for chips/fries - Encouraged regular meals and limiting snacking - Encouraged patient to try for one third protein, one third carbs, one third fruits/vegetables when constructing meals - Also encouraged patient to work on getting regular exercise and - Will assess adherence to plan at office visit later this month - Can consider referral to healthy weight and wellness

## 2024-01-27 NOTE — Progress Notes (Signed)
    SUBJECTIVE:   CHIEF COMPLAINT / HPI:   Julie Hendrix is a 38 y.o. female presenting for follow-up of prediabetes and weight loss.  Prediabetes Obesity Had been on Wegovy  0.5 mg weekly; unfortunately, with recent insurance changes, this will no longer be covered by her insurance. Reports Wegovy  did not help much with controlling her appetite. Has not tried other means of weight loss. Was last 262 lb in 08/2023, now 256 lb today. Had previously been 242 lb in 03/2023. Works third shift, eats at around [employer] for this while at work. Doesn't eat often, but does eat a lot when she does. Snacks too. Fried foods, chips and snacks. Also eating out some. Does not drink a ton of sweet drinks (1-2 a week); gets a lot of water. Sleep schedule is poor, gets 6-7 hours a day, broken up. Snores some. Has had a sleep study, diagnosed with mild OSA and recommended CPAP. Has not gotten this.  Healthcare maintenance: Will follow up at yearly OV. - Flu shot - denied - COVID-vaccine - Tdap booster - Hepatitis B vaccine - HPV? - Urine creatinine ratio - ordered today - Diabetic eye exam  PERTINENT  PMH / PSH: Prediabetes, HTN, GERD, PCOS  OBJECTIVE:   BP 133/74   Pulse 80   Ht 5' 3 (1.6 m)   Wt 256 lb (116.1 kg)   LMP 01/04/2024 (Exact Date)   SpO2 100%   BMI 45.35 kg/m     General: Patient seated in chair, no acute distress. Respiratory: Normal work of breathing on room air Neuro: Alert and appropriately responding to questions.  Normal gait. Psych: Appropriate affect.  ASSESSMENT/PLAN:   Assessment & Plan BMI 40.0-44.9, adult (HCC) Unfortunately, insurance not covering Wegovy  anymore, the patient reports she did not have great weight loss on this medication.  Motivated to lose weight.  Had extensive discussion regarding dietary interventions.  Also discussed how exercise is not as helpful with weight loss, but has a number of other health benefits. - Plan to substitute foods,  grilled for fried, fruits/veggies for chips/fries - Encouraged regular meals and limiting snacking - Encouraged patient to try for one third protein, one third carbs, one third fruits/vegetables when constructing meals - Also encouraged patient to work on getting regular exercise and - Will assess adherence to plan at office visit later this month - Can consider referral to healthy weight and wellness Prediabetes A1c of 5.3.  Has been taking Wegovy , has 1 dose left. Unfortunately, insurance not covering Wegovy  anymore.  Will manage with diet and exercise. - Labs: Lipid Panel Vitamin D  deficiency Last measurement 10.3 in 05/2023.  Had been taking vitamin D , not currently on this.  Will reassess level and decide on need to restart vitamin D  supplementation pending this. - Labs: Vit D Mild obstructive sleep apnea Last sleep study in 02/2023.  Mild, but can consider CPAP given history of headache and fractured sleep schedule. - Consider CPAP at next office visit   Return in 2 to 3 weeks for annual physical exam.  Alan Flies, MD [facility]

## 2024-01-27 NOTE — Assessment & Plan Note (Signed)
 Last sleep study in 02/2023.  Mild, but can consider CPAP given history of headache and fractured sleep schedule. - Consider CPAP at next office visit

## 2024-01-27 NOTE — Assessment & Plan Note (Addendum)
 Last measurement 10.3 in 05/2023.  Had been taking vitamin D , not currently on this.  Will reassess level and decide on need to restart vitamin D  supplementation pending this. - Labs: Vit D

## 2024-01-27 NOTE — Patient Instructions (Addendum)
 Julie Hendrix,   It was great seeing you in clinic today! You came in for follow-up of your prediabetes and weight. Unfortunately, your insurance will no longer cover the Wegovy  injections. We will try to manage weight loss with diet changes.  There are a few things for you to do outside of clinic: - Try to sub in healthier choices, like picking grilled instead of fried foods, and choosing fruits and vegetables over chips and fries - Work on doing regular meals around your third shift schedule; a bigger meal before starting, a lunch in the middle, and a smaller meal at the end. Try to limit snacking, and swap in healthier options as you're able! - Try to work on doing 1/3 protein, 1/3 carbs, 1/3 fruits/vegetables with your meals - Work on starting regular walking/exercise; having your sister be your exercise buddy is a great idea!  Make an appointment to see me back in the next 2-3 weeks.  Please bring all your medications to your next office visit.  Thank you for allowing me to be a part of your care team! Julie Flies, MD [facility] [address] [place], [place], [place] [address] [phone]

## 2024-01-27 NOTE — Assessment & Plan Note (Addendum)
 A1c of 5.3.  Has been taking Wegovy , has 1 dose left. Unfortunately, insurance not covering Wegovy  anymore.  Will manage with diet and exercise. - Labs: Lipid Panel

## 2024-01-28 ENCOUNTER — Ambulatory Visit: Payer: Self-pay

## 2024-01-28 NOTE — Progress Notes (Signed)
 Called patient to discuss results of labs.  Informed her that her lipid panel is normal and so no medication changes need to be made, encouraged her to keep up the good work with lifestyle interventions.  Urine microalbumin/creatinine ratio still pending, and informed patient results will return to MyChart and I will reach out if anything is abnormal.  Her vitamin D  level was slightly low, and instructed patient to start an over-the-counter vitamin D  supplement at 1000 units or 25 mg daily or every other day.  All questions answered.

## 2024-01-29 LAB — LIPID PANEL
Chol/HDL Ratio: 3.1 ratio (ref 0.0–4.4)
Cholesterol, Total: 135 mg/dL (ref 100–199)
HDL: 43 mg/dL (ref >39)
LDL Chol Calc (NIH): 74 mg/dL (ref 0–99)
Triglycerides: 95 mg/dL (ref 0–149)
VLDL Cholesterol Cal: 18 mg/dL (ref 5–40)

## 2024-01-29 LAB — MICROALBUMIN / CREATININE URINE RATIO
Creatinine, Urine: 54.6 mg/dL
Microalb/Creat Ratio: 6 mg/g{creat} (ref 0–29)
Microalbumin, Urine: 3.5 ug/mL

## 2024-01-29 LAB — VITAMIN D 25 HYDROXY (VIT D DEFICIENCY, FRACTURES): Vit D, 25-Hydroxy: 23.6 ng/mL — ABNORMAL LOW (ref 30.0–100.0)

## 2024-02-09 ENCOUNTER — Ambulatory Visit (HOSPITAL_COMMUNITY)
Admission: EM | Admit: 2024-02-09 | Discharge: 2024-02-09 | Disposition: A | Discharge status: Home / Self Care | Attending: Family Medicine | Admitting: Family Medicine

## 2024-02-09 ENCOUNTER — Encounter (HOSPITAL_COMMUNITY): Payer: Self-pay

## 2024-02-09 DIAGNOSIS — J029 Acute pharyngitis, unspecified: Secondary | ICD-10-CM | POA: Diagnosis not present

## 2024-02-09 LAB — POCT RAPID STREP A (OFFICE): Rapid Strep A Screen: NEGATIVE

## 2024-02-09 NOTE — ED Triage Notes (Signed)
 Pt states woke up with lt sided sore throat and hard to swallow or eat. States gargled with peroxide.

## 2024-02-09 NOTE — Discharge Instructions (Signed)
 You may use over the counter ibuprofen or acetaminophen as needed.  For a sore throat, over the counter products such as [employer]gate Peroxyl Mouth Sore Rinse or Chloraseptic Sore Throat Spray may provide some temporary relief. Your rapid strep test was negative today. We have sent your throat swab for culture and will let you know of any positive results.

## 2024-02-09 NOTE — ED Provider Notes (Signed)
 [facility]   [id number] 02/09/24 Arrival Time: 1035  ASSESSMENT & PLAN:  1. Sore throat    No signs of peritonsillar abscess.  Results for orders placed or performed during the hospital encounter of 02/09/24  POC rapid strep A   Collection Time: 02/09/24 12:27 PM  Result Value Ref Range   Rapid Strep A Screen Negative Negative   Labs Reviewed  POCT RAPID STREP A (OFFICE)   Throat culture sent.  OTC analgesics and throat care as needed   Discharge Instructions      You may use over the counter ibuprofen  or acetaminophen  as needed.  For a sore throat, over the counter products such as [employer]gate Peroxyl Mouth Sore Rinse or Chloraseptic Sore Throat Spray may provide some temporary relief. Your rapid strep test was negative today. We have sent your throat swab for culture and will let you know of any positive results.      Reviewed expectations re: course of current medical issues. Questions answered. Outlined signs and symptoms indicating need for more acute intervention. Patient verbalized understanding. After Visit Summary given.   SUBJECTIVE:  Julie Hendrix is a 38 y.o. female who reports a sore throat. Left sided. Noted yesterday. Denies fever. Maybe a very mild cough. No tx PTA.   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 Started 12/2018, trying to quit.    Social History   Socioeconomic History   Marital status: Single    Spouse name: Not on file   Number of children: Not on file   Years of education: Not on file   Highest education level: Not on file  Occupational History   Not on file  Tobacco Use   Smoking status: Every Day    Current packs/day: 0.00    Types: Cigarettes, Cigars    Last attempt to quit: 05/21/2020    Years since quitting: 3.7   Smokeless tobacco: Never   Tobacco comments:    black and mild  Vaping Use   Vaping status: Never Used  Substance and Sexual Activity   Alcohol use: Yes    Alcohol/week: 9.0 standard drinks of alcohol    Types: 3 Glasses of wine, 3 Cans of beer, 3 Shots of liquor per week   Drug use: No   Sexual activity: Yes    Birth control/protection: None  Other Topics Concern   Not on file  Social History Narrative   Not on file   Social Drivers of Health   Financial Resource Strain: Not on file  Food Insecurity: Food Insecurity Present (08/19/2021)   Hunger Vital Sign    Worried About Running Out of Food in the  Last Year: Sometimes true    Ran Out of Food in the Last Year: Sometimes true  Transportation Needs: No Transportation Needs (08/19/2021)   PRAPARE - Administrator, Civil Service (Medical): No    Lack of Transportation (Non-Medical): No  Physical Activity: Inactive (05/04/2017)   Exercise Vital Sign    Days of Exercise per Week: 0 days    Minutes of Exercise per Session: 0 min  Stress: No Stress Concern Present (05/04/2017)   [employer] of Occupational Health - Occupational Stress Questionnaire    Feeling of Stress : Not at all  Social Connections: Somewhat Isolated (05/04/2017)   Social Connection and Isolation Panel    Frequency  of Communication with Friends and Family: More than three times a week    Frequency of Social Gatherings with Friends and Family: Once a week    Attends Religious Services: More than 4 times per year    Active Member of [employer] or Organizations: No    Attends Banker Meetings: Never    Marital Status: Never married  Intimate Partner Violence: Not At Risk (05/04/2017)   Humiliation, Afraid, Rape, and Kick questionnaire    Fear of Current or Ex-Partner: No    Emotionally Abused: No    Physically Abused: No    Sexually Abused: No   Family History  Problem Relation Age of Onset   Hypertension Father    Diabetes Father    Heart attack Father    Hypertension Mother            Rolinda Rogue, MD 02/09/24 1339

## 2024-02-18 ENCOUNTER — Ambulatory Visit (HOSPITAL_COMMUNITY)
Admission: EM | Admit: 2024-02-18 | Discharge: 2024-02-18 | Disposition: A | Discharge status: Home / Self Care | Attending: Internal Medicine | Admitting: Internal Medicine

## 2024-02-18 ENCOUNTER — Encounter (HOSPITAL_COMMUNITY): Payer: Self-pay

## 2024-02-18 DIAGNOSIS — J069 Acute upper respiratory infection, unspecified: Secondary | ICD-10-CM | POA: Diagnosis not present

## 2024-02-18 DIAGNOSIS — R051 Acute cough: Secondary | ICD-10-CM

## 2024-02-18 LAB — POC COVID19/FLU A&B COMBO
Covid Antigen, POC: NEGATIVE
Influenza A Antigen, POC: NEGATIVE
Influenza B Antigen, POC: NEGATIVE

## 2024-02-18 MED ORDER — PREDNISONE 20 MG PO TABS: 40.0000 mg | ORAL_TABLET | Freq: Every day | ORAL | 0 refills | Status: AC

## 2024-02-18 MED ORDER — PROMETHAZINE-DM 6.25-15 MG/5ML PO SYRP: 5.0000 mL | ORAL_SOLUTION | Freq: Three times a day (TID) | ORAL | 0 refills | Status: DC | PRN

## 2024-02-18 NOTE — Discharge Instructions (Addendum)
 Flu A, flu B and COVID testing done today.  These are negative. Symptoms are most consistent with a viral infection.  This does not require antibiotic treatment.  We focus treatment on improving the symptoms.  We will treat with the following:  Prednisone  40 mg (2 tablets) once daily for 5 days. Take this in the morning.  This is a steroid to help with inflammation and pain.  Do not take ibuprofen  while you are taking this medication May use Tylenol  for fever or pain Promethazine  DM 5 mL every 8 hours as needed for cough.  Use caution as this medication can cause drowsiness. May continue to use Mucinex  and Flonase  Make sure to stay hydrated by drinking plenty of water. Return to urgent care or PCP if symptoms worsen or fail to resolve.

## 2024-02-18 NOTE — ED Triage Notes (Signed)
 Pt states cough,congestion,headache and body aches for the past 3 days.

## 2024-02-18 NOTE — ED Provider Notes (Signed)
 [facility]    CSN: [id number] Arrival date & time: 02/18/24  1945      History   Chief Complaint Chief Complaint  Patient presents with   Cough    HPI Julie Hendrix is a 38 y.o. female.   38 year old female presents urgent care with complaints of cough, congestion, body aches and headache.  She reports her symptoms started about 3 days ago.  She has been using over-the-counter Flonase  and Mucinex  as well as Tylenol .  She was concerned she might have flu or COVID.  She denies any fevers, chills, nausea, vomiting, shortness of breath.   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 Started 12/2018, trying to quit.     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/27/2024   Cubital tunnel syndrome of both upper extremities 08/11/2022   Abnormal uterine bleeding 08/11/2022   Back pain 09/06/2019   Gastroesophageal reflux disease 06/06/2019   Prediabetes 06/17/2018   Essential hypertension 05/18/2018   BMI 40.0-44.9, adult (HCC) 11/30/2017   PCOS (polycystic ovarian syndrome) 11/30/2017   Low grade squamous intraepithelial lesion on cytologic smear of cervix (LGSIL) 05/21/2017    Past Surgical History:  Procedure Laterality Date   COLPOSCOPY     CRYOTHERAPY  06/08/2017   cervix    OB History     Gravida  3   Para  2   Term  2   Preterm  0   AB  1    Living  2      SAB  0   IAB  1   Ectopic  0   Multiple  0   Live Births  2            Home Medications    Prior to Admission medications   Medication Sig Start Date End Date Taking? Authorizing Provider  predniSONE  (DELTASONE ) 20 MG tablet Take 2 tablets (40 mg total) by mouth daily with breakfast for 5 days. 02/18/24 02/23/24 Yes Yeimy Brabant A, PA-C  promethazine -dextromethorphan (PROMETHAZINE -DM) 6.25-15 MG/5ML syrup Take 5 mLs by mouth every 8 (eight) hours as needed for cough. 02/18/24  Yes Juni Glaab A, PA-C  amlodipine -olmesartan  (AZOR ) 10-20 MG tablet TAKE 1 TABLET BY MOUTH DAILY 01/18/24   Larraine Palma, MD  fluticasone  (FLONASE ) 50 MCG/ACT nasal spray Place 2 sprays into both nostrils daily. Patient not taking: Reported on 01/27/2024 07/26/23   Christia Budds, MD  metroNIDAZOLE  (METROGEL ) 0.75 % vaginal gel Place 1 Applicatorful vaginally at bedtime. Apply one applicatorful to vagina at bedtime for 5 days 01/22/24   Nicholaus Burnard HERO, MD  semaglutide -weight management (WEGOVY ) 0.5 MG/0.5ML SOAJ SQ injection Inject 0.5 mg into the skin once a week. 01/20/24   Nemecek, Palma, MD  Vitamin D , Ergocalciferol , (DRISDOL ) 1.25 MG (50000 UNIT) CAPS capsule TAKE 1 CAPSULE BY MOUTH EVERY 7 DAYS Patient not taking:  Reported on 01/27/2024 10/13/23   Marlee Lynwood NOVAK, MD    Family History Family History  Problem Relation Age of Onset   Hypertension Father    Diabetes Father    Heart attack Father    Hypertension Mother     Social History Social History   Tobacco Use   Smoking status: Every Day    Current packs/day: 0.00    Types: Cigarettes, Cigars    Last attempt to quit: 05/21/2020    Years since quitting: 3.7   Smokeless tobacco: Never   Tobacco comments:    black and mild  Vaping Use   Vaping status: Never Used  Substance Use Topics   Alcohol use: Yes    Alcohol/week: 9.0 standard drinks of alcohol    Types: 3 Glasses of wine, 3 Cans of beer, 3 Shots of  liquor per week   Drug use: No     Allergies   Patient has no known allergies.   Review of Systems Review of Systems  Constitutional:  Negative for chills and fever.  HENT:  Positive for congestion. Negative for ear pain and sore throat.   Eyes:  Negative for pain and visual disturbance.  Respiratory:  Positive for cough. Negative for shortness of breath.   Cardiovascular:  Negative for chest pain and palpitations.  Gastrointestinal:  Negative for abdominal pain and vomiting.  Genitourinary:  Negative for dysuria and hematuria.  Musculoskeletal:  Positive for myalgias. Negative for arthralgias and back pain.  Skin:  Negative for color change and rash.  Neurological:  Positive for headaches. Negative for seizures and syncope.  All other systems reviewed and are negative.    Physical Exam Triage Vital Signs ED Triage Vitals  Encounter Vitals Group     BP 02/18/24 2009 134/72     Girls Systolic BP Percentile --      Girls Diastolic BP Percentile --      Boys Systolic BP Percentile --      Boys Diastolic BP Percentile --      Pulse Rate 02/18/24 2009 72     Resp 02/18/24 2009 16     Temp 02/18/24 2009 98 F (36.7 C)     Temp Source 02/18/24 2009 Oral     SpO2 02/18/24 2009 98 %     Weight --      Height --      Head Circumference --      Peak Flow --      Pain Score 02/18/24 2008 0     Pain Loc --      Pain Education --      Exclude from Growth Chart --    No data found.  Updated Vital Signs BP 134/72 (BP Location: Left Arm)   Pulse 72   Temp 98 F (36.7 C) (Oral)   Resp 16   LMP 02/04/2024 (Exact Date)   SpO2 98%   Visual Acuity Right Eye Distance:   Left Eye Distance:   Bilateral Distance:    Right Eye Near:   Left Eye Near:    Bilateral Near:     Physical Exam Vitals and nursing note reviewed.  Constitutional:      General: She is not in acute distress.    Appearance: She is well-developed.  HENT:     Head: Normocephalic and atraumatic.      Right Ear: Tympanic membrane normal.     Left Ear: Tympanic membrane normal.     Nose: Congestion present.  Mouth/Throat:     Mouth: Mucous membranes are moist.     Pharynx: No oropharyngeal exudate or posterior oropharyngeal erythema.  Eyes:     Conjunctiva/sclera: Conjunctivae normal.  Cardiovascular:     Rate and Rhythm: Normal rate and regular rhythm.     Heart sounds: No murmur heard. Pulmonary:     Effort: Pulmonary effort is normal. No respiratory distress.     Breath sounds: Normal breath sounds.  Abdominal:     Palpations: Abdomen is soft.     Tenderness: There is no abdominal tenderness.  Musculoskeletal:        General: No swelling.     Cervical back: Neck supple.  Skin:    General: Skin is warm and dry.     Capillary Refill: Capillary refill takes less than 2 seconds.  Neurological:     General: No focal deficit present.     Mental Status: She is alert.  Psychiatric:        Mood and Affect: Mood normal.      UC Treatments / Results  Labs (all labs ordered are listed, but only abnormal results are displayed) Labs Reviewed  POC COVID19/FLU A&B COMBO    EKG   Radiology No results found.  Procedures Procedures (including critical care time)  Medications Ordered in UC Medications - No data to display  Initial Impression / Assessment and Plan / UC Course  I have reviewed the triage vital signs and the nursing notes.  Pertinent labs & imaging results that were available during my care of the patient were reviewed by me and considered in my medical decision making (see chart for details).     Viral URI with cough  Acute cough   Flu A, flu B and COVID testing done today.  These are negative. Symptoms are most consistent with a viral infection.  This does not require antibiotic treatment.  We focus treatment on improving the symptoms.  We will treat with the following:  Prednisone  40 mg (2 tablets) once daily for 5 days. Take this in the morning.   This is a steroid to help with inflammation and pain.  Do not take ibuprofen  while you are taking this medication May use Tylenol  for fever or pain Promethazine  DM 5 mL every 8 hours as needed for cough.  Use caution as this medication can cause drowsiness. May continue to use Mucinex  and Flonase  Make sure to stay hydrated by drinking plenty of water. Return to urgent care or PCP if symptoms worsen or fail to resolve.    Final Clinical Impressions(s) / UC Diagnoses   Final diagnoses:  Viral URI with cough  Acute cough     Discharge Instructions      Flu A, flu B and COVID testing done today.  These are negative. Symptoms are most consistent with a viral infection.  This does not require antibiotic treatment.  We focus treatment on improving the symptoms.  We will treat with the following:  Prednisone  40 mg (2 tablets) once daily for 5 days. Take this in the morning.  This is a steroid to help with inflammation and pain.  Do not take ibuprofen  while you are taking this medication May use Tylenol  for fever or pain Promethazine  DM 5 mL every 8 hours as needed for cough.  Use caution as this medication can cause drowsiness. May continue to use Mucinex  and Flonase  Make sure to stay hydrated by drinking plenty of water. Return to urgent care or PCP if symptoms  worsen or fail to resolve.      ED Prescriptions     Medication Sig Dispense Auth. Provider   predniSONE  (DELTASONE ) 20 MG tablet Take 2 tablets (40 mg total) by mouth daily with breakfast for 5 days. 10 tablet Ciel Yanes A, PA-C   promethazine -dextromethorphan (PROMETHAZINE -DM) 6.25-15 MG/5ML syrup Take 5 mLs by mouth every 8 (eight) hours as needed for cough. 180 mL Teresa Almarie LABOR, [place]      PDMP not reviewed this encounter.   Teresa Almarie LABOR, PA-C 02/18/24 2039

## 2024-02-25 ENCOUNTER — Encounter
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
## 2024-03-02 ENCOUNTER — Ambulatory Visit

## 2024-03-02 VITALS — BP 114/70 | HR 72 | Ht 63.0 in | Wt 257.0 lb

## 2024-03-02 DIAGNOSIS — Z0000 Encounter for general adult medical examination without abnormal findings: Secondary | ICD-10-CM

## 2024-03-02 DIAGNOSIS — R350 Frequency of micturition: Secondary | ICD-10-CM | POA: Diagnosis not present

## 2024-03-02 DIAGNOSIS — R0981 Nasal congestion: Secondary | ICD-10-CM | POA: Diagnosis not present

## 2024-03-02 DIAGNOSIS — Z6841 Body Mass Index (BMI) 40.0 and over, adult: Secondary | ICD-10-CM

## 2024-03-02 LAB — POCT URINALYSIS DIP (MANUAL ENTRY)
Bilirubin, UA: NEGATIVE
Glucose, UA: NEGATIVE mg/dL
Ketones, POC UA: NEGATIVE mg/dL
Leukocytes, UA: NEGATIVE
Nitrite, UA: NEGATIVE
Protein Ur, POC: NEGATIVE mg/dL
Spec Grav, UA: 1.025 (ref 1.010–1.025)
Urobilinogen, UA: 0.2 U/dL
pH, UA: 6 (ref 5.0–8.0)

## 2024-03-02 LAB — POCT UA - MICROSCOPIC ONLY
Epithelial cells, urine per micros: >20
RBC, Urine, Miroscopic: NONE SEEN (ref 0–2)
WBC, Ur, HPF, POC: NONE SEEN (ref 0–5)
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
## 2024-03-02 NOTE — Assessment & Plan Note (Signed)
 Had in-depth discussion about weight management via dietary changes, including limiting fried/fatty/fast foods, reducing snacking, and incorporating more fruits and vegetables into diet.  Also discussed occasional episodes of feeling out of control while eating.  Also discussed patient's abdominal pain and bowel movements following eating, though somewhat limited by time. - Patient provided resources for finding a therapist - Dietary counseling provided - Exercise counseling provided - Encouraged patient to keep a food diary - Will plan to follow-up with patient in the new year to review food diary, lifestyle changes, GI symptoms

## 2024-03-02 NOTE — Patient Instructions (Addendum)
 Julie Hendrix,   It was great seeing you in clinic today! You came in for your annual physical.  There are a few things for you to do outside of clinic: - Try using the Flonase  2 sprays in each nostril daily (remember to aim towards your ears!), Nasal saline rinses to help with your congestion - Go to psychologytoday.com to find a therapist - Try to walk or exercise a total of 150 min a week - Try keeping a food diary, especially noting what foods make your stomach hurt and which ones cause you to use the bathroom more - Work on reducing fried/fatty/fast foods in your diet, and try to eat more fruits and veggies as you can - Start a prenatal vitamin - Schedule your mammogram with [facility] at [phone]  I'll see you back in the new year for a follow-up visit; please schedule this on your way out! Please bring all your medications to your next office visit.  Thank you for allowing me to be a part of your care team! Alan Flies, MD [facility] [address] [place], [place], [place] [address] [phone]

## 2024-04-17 ENCOUNTER — Ambulatory Visit: Payer: Self-pay

## 2024-05-08 ENCOUNTER — Other Ambulatory Visit: Payer: Self-pay | Admitting: Family Medicine

## 2024-05-08 DIAGNOSIS — Z1231 Encounter for screening mammogram for malignant neoplasm of breast: Secondary | ICD-10-CM

## 2024-05-10 ENCOUNTER — Inpatient Hospital Stay: Admission: RE | Admit: 2024-05-10 | Discharge: 2024-05-10

## 2024-05-10 DIAGNOSIS — Z1231 Encounter for screening mammogram for malignant neoplasm of breast: Secondary | ICD-10-CM

## 2024-05-11 ENCOUNTER — Ambulatory Visit

## 2024-05-11 VITALS — BP 137/96 | HR 77 | Ht 63.0 in | Wt 265.8 lb

## 2024-05-11 DIAGNOSIS — Z6841 Body Mass Index (BMI) 40.0 and over, adult: Secondary | ICD-10-CM

## 2024-05-11 DIAGNOSIS — I10 Essential (primary) hypertension: Secondary | ICD-10-CM

## 2024-05-11 MED ORDER — NALTREXONE-BUPROPION HCL ER 8-90 MG PO TB12: ORAL_TABLET | ORAL | 3 refills | Status: AC

## 2024-05-11 NOTE — Patient Instructions (Addendum)
 [place] [employer],   It was great seeing you in clinic today! You came in for follow-up of weight management. It is very hard to loose weight. I would recommend trying to reduce the amount of food you're eating, and especially trying to reduce fatty, fried, or fast foods and white starches and sugary foods and drinks. Your plate should be roughly 1/3 vegetables, 1/3 starches, 1/3 proteins.  You can also go to https://www.psychologytoday.com/us  to find a therapist to help with binge eating.  I am also prescribing a medication called Contrave  to help with suppressing appetite. I am hoping insurance approves this, but if they do not, I will prescribe a component part of the medication. For Contrave , take the medication as follows: Week 1: 1 pill once daily Week 2: 2 pills, one in the morning and one in the evening (or roughly 12 hours apart) Week 3: 3 pills, 2 in the morning and one in the evening (or roughly 12 hours apart) Week 4 and onward: 4 pills, 2 in the morning and 2 in the evening (or roughly 12 hours apart)  Follow-up with me in 3 months as scheduled.  Thank you for allowing me to be a part of your care team! Alan Flies, MD [facility] [address] [place], [place], [place] [address] [phone]

## 2024-05-11 NOTE — Assessment & Plan Note (Addendum)
 Improved on recheck (144/97 to 137/96), still elevated.  Previously normal at 02/2024 office visit. Patient reports compliance with medication.   Will not make any medication changes at this time and will reassess at follow-up appointment in 3 months. - Continue amlodipine -olmesartan  10-20 mg daily - Encouraged weight loss to help with BP control

## 2024-05-11 NOTE — Assessment & Plan Note (Addendum)
 Weight today is up 8 lb since 02/2024 OV.  Patient has not had much luck in making dietary changes.  Remains motivated to lose weight and is interested in trying medications to help with this. - Discussed dietary changes; handout provided - Encouraged regular physical activity - Encouraged regular wake and sleep times - Encouraged reaching out to therapist to help with binge eating behaviors - Contrave  (wellbutrin  + naltrexone ) prescribed and discussed tapering up with patient; consider wellbutrin  alone if insurance doesn't cover Contrave 

## 2024-05-11 NOTE — Progress Notes (Signed)
" ° ° °  SUBJECTIVE:   CHIEF COMPLAINT / HPI:   Julie Hendrix is a 38 y.o. female presenting for weight management.  Weight Management (BMI 40-44.9) Previously discussed at 02/2024 OV. Weight of 257 lb (116.6 kg) at that time. Patient recommended to find a therapist and keep a food diary. Tried Wegovy  0.5 mg weekly in the past, did not help much with appetite suppression and unfortunately no longer covered by insurance.  Today, patient reports she has not had any luck with weight loss. Weight today of 265 lb (120.6 kg). Eating: Has been eating a lot of starch (potatoes, rice), eating out moreso; has not been able to make big changes to diet; drinking sodas (1-2 a day), beer (not every day, can be 3 beers a day) Activity: Walking around [employer]; hoping to start having 20-30 min walks most days Sleeping: Works 4pm-12:30am; sleeping ~3am-1-2pm. Has not reached out to a therapist yet about her binge eating habits.  Healthcare Maintenance: - Mammogram - done 05/10/24!  PERTINENT  PMH / PSH: HTN, OSA, GERD, PCOS, prediabetes, obesity, vitamin D  deficiency   OBJECTIVE:   BP (!) 137/96   Pulse 77   Ht 5' 3 (1.6 m)   Wt 265 lb 12.8 oz (120.6 kg)   LMP 05/08/2024   SpO2 100%   BMI 47.08 kg/m    General: Patient seated in chair, no acute distress. Cardiovascular: Regular rate and rhythm, no murmurs/rubs/gallops. Respiratory: Normal work of breathing on room air. Clear to auscultation bilaterally; no wheezes, crackles. Abdomen: Bowel sounds present and normoactive bilaterally. Soft, protuberant, nontender.  ASSESSMENT/PLAN:   Assessment & Plan BMI 45.0-49.9, adult (HCC) Weight today is up 8 lb since 02/2024 OV.  Patient has not had much luck in making dietary changes.  Remains motivated to lose weight and is interested in trying medications to help with this. - Discussed dietary changes; handout provided - Encouraged regular physical activity - Encouraged regular wake and  sleep times - Encouraged reaching out to therapist to help with binge eating behaviors - Contrave  (wellbutrin  + naltrexone ) prescribed and discussed tapering up with patient; consider wellbutrin  alone if insurance doesn't cover Contrave  Essential hypertension Improved on recheck (144/97 to 137/96), still elevated.  Previously normal at 02/2024 office visit. Patient reports compliance with medication.   Will not make any medication changes at this time and will reassess at follow-up appointment in 3 months. - Continue amlodipine -olmesartan  10-20 mg daily - Encouraged weight loss to help with BP control    Follow up in 3 months as scheduled.  Alan Flies, MD [facility] "

## 2024-08-16 ENCOUNTER — Ambulatory Visit: Payer: Self-pay
